# Patient Record
Sex: Female | Born: 1935 | Race: White | Hispanic: No | Smoking: Former smoker
Health system: Southern US, Community
[De-identification: ages and names within clinical notes are randomized; demographics above are authoritative.]

## PROBLEM LIST (undated history)

## (undated) DIAGNOSIS — Z5189 Encounter for other specified aftercare: Secondary | ICD-10-CM

## (undated) DIAGNOSIS — C8590 Non-Hodgkin lymphoma, unspecified, unspecified site: Secondary | ICD-10-CM

## (undated) DIAGNOSIS — I10 Essential (primary) hypertension: Secondary | ICD-10-CM

## (undated) DIAGNOSIS — G4730 Sleep apnea, unspecified: Secondary | ICD-10-CM

## (undated) DIAGNOSIS — I509 Heart failure, unspecified: Secondary | ICD-10-CM

## (undated) DIAGNOSIS — C801 Malignant (primary) neoplasm, unspecified: Secondary | ICD-10-CM

## (undated) HISTORY — PX: BACK SURGERY: SHX140

## (undated) HISTORY — PX: APPENDECTOMY: SHX54

## (undated) HISTORY — PX: TONSILLECTOMY: SUR1361

## (undated) HISTORY — PX: CHOLECYSTECTOMY: SHX55

## (undated) HISTORY — PX: ABDOMINAL HYSTERECTOMY: SHX81

## (undated) HISTORY — PX: SPLENECTOMY: SUR1306

## (undated) HISTORY — PX: BREAST CYST ASPIRATION: SHX578

## (undated) HISTORY — PX: JOINT REPLACEMENT: SHX530

## (undated) HISTORY — PX: EYE SURGERY: SHX253

---

## 1998-03-03 ENCOUNTER — Other Ambulatory Visit: Admission: RE | Admit: 1998-03-03 | Discharge: 1998-03-03 | Payer: Self-pay | Admitting: Family Medicine

## 1999-02-14 ENCOUNTER — Encounter: Admission: RE | Admit: 1999-02-14 | Discharge: 1999-02-14 | Payer: Self-pay | Admitting: Family Medicine

## 1999-02-14 ENCOUNTER — Encounter: Payer: Self-pay | Admitting: Family Medicine

## 1999-05-02 ENCOUNTER — Encounter: Admission: RE | Admit: 1999-05-02 | Discharge: 1999-05-02 | Payer: Self-pay | Admitting: Family Medicine

## 1999-05-02 ENCOUNTER — Encounter: Payer: Self-pay | Admitting: Family Medicine

## 1999-05-10 ENCOUNTER — Encounter: Admission: RE | Admit: 1999-05-10 | Discharge: 1999-05-10 | Payer: Self-pay | Admitting: Family Medicine

## 1999-05-10 ENCOUNTER — Encounter: Payer: Self-pay | Admitting: Family Medicine

## 2000-05-14 ENCOUNTER — Encounter: Payer: Self-pay | Admitting: Family Medicine

## 2000-05-14 ENCOUNTER — Encounter: Admission: RE | Admit: 2000-05-14 | Discharge: 2000-05-14 | Payer: Self-pay | Admitting: Family Medicine

## 2001-05-20 ENCOUNTER — Encounter: Payer: Self-pay | Admitting: Family Medicine

## 2001-05-20 ENCOUNTER — Encounter: Admission: RE | Admit: 2001-05-20 | Discharge: 2001-05-20 | Payer: Self-pay | Admitting: Family Medicine

## 2001-05-27 ENCOUNTER — Encounter: Payer: Self-pay | Admitting: Family Medicine

## 2001-05-27 ENCOUNTER — Encounter: Admission: RE | Admit: 2001-05-27 | Discharge: 2001-05-27 | Payer: Self-pay | Admitting: Family Medicine

## 2002-01-10 ENCOUNTER — Encounter: Admission: RE | Admit: 2002-01-10 | Discharge: 2002-01-10 | Payer: Self-pay | Admitting: Family Medicine

## 2002-01-10 ENCOUNTER — Encounter: Payer: Self-pay | Admitting: Family Medicine

## 2002-06-05 ENCOUNTER — Encounter: Admission: RE | Admit: 2002-06-05 | Discharge: 2002-06-05 | Payer: Self-pay | Admitting: Family Medicine

## 2002-06-05 ENCOUNTER — Encounter: Payer: Self-pay | Admitting: Family Medicine

## 2003-06-08 ENCOUNTER — Encounter: Admission: RE | Admit: 2003-06-08 | Discharge: 2003-06-08 | Payer: Self-pay | Admitting: Family Medicine

## 2004-08-19 ENCOUNTER — Encounter: Admission: RE | Admit: 2004-08-19 | Discharge: 2004-08-19 | Payer: Self-pay | Admitting: Family Medicine

## 2004-08-29 ENCOUNTER — Encounter: Admission: RE | Admit: 2004-08-29 | Discharge: 2004-08-29 | Payer: Self-pay | Admitting: Family Medicine

## 2004-09-02 ENCOUNTER — Encounter (INDEPENDENT_AMBULATORY_CARE_PROVIDER_SITE_OTHER): Payer: Self-pay | Admitting: *Deleted

## 2004-09-02 ENCOUNTER — Encounter: Admission: RE | Admit: 2004-09-02 | Discharge: 2004-09-02 | Payer: Self-pay | Admitting: Family Medicine

## 2004-10-05 ENCOUNTER — Inpatient Hospital Stay (HOSPITAL_COMMUNITY): Admission: EM | Admit: 2004-10-05 | Discharge: 2004-10-08 | Payer: Self-pay | Admitting: Emergency Medicine

## 2004-10-06 ENCOUNTER — Encounter (INDEPENDENT_AMBULATORY_CARE_PROVIDER_SITE_OTHER): Payer: Self-pay | Admitting: Cardiovascular Disease

## 2005-09-01 ENCOUNTER — Encounter: Admission: RE | Admit: 2005-09-01 | Discharge: 2005-09-01 | Payer: Self-pay | Admitting: Family Medicine

## 2006-04-23 ENCOUNTER — Encounter: Admission: RE | Admit: 2006-04-23 | Discharge: 2006-04-23 | Payer: Self-pay | Admitting: Family Medicine

## 2006-06-17 ENCOUNTER — Inpatient Hospital Stay (HOSPITAL_COMMUNITY): Admission: EM | Admit: 2006-06-17 | Discharge: 2006-06-19 | Payer: Self-pay | Admitting: Emergency Medicine

## 2006-08-01 ENCOUNTER — Ambulatory Visit (HOSPITAL_COMMUNITY): Admission: RE | Admit: 2006-08-01 | Discharge: 2006-08-01 | Payer: Self-pay | Admitting: Family Medicine

## 2006-09-17 ENCOUNTER — Encounter: Admission: RE | Admit: 2006-09-17 | Discharge: 2006-09-17 | Payer: Self-pay | Admitting: Family Medicine

## 2007-11-18 ENCOUNTER — Encounter: Admission: RE | Admit: 2007-11-18 | Discharge: 2007-11-18 | Payer: Self-pay | Admitting: Family Medicine

## 2007-11-21 ENCOUNTER — Encounter: Admission: RE | Admit: 2007-11-21 | Discharge: 2007-11-21 | Payer: Self-pay | Admitting: Family Medicine

## 2008-06-05 ENCOUNTER — Encounter: Admission: RE | Admit: 2008-06-05 | Discharge: 2008-06-05 | Payer: Self-pay | Admitting: Internal Medicine

## 2008-11-23 ENCOUNTER — Encounter: Admission: RE | Admit: 2008-11-23 | Discharge: 2008-11-23 | Payer: Self-pay | Admitting: Internal Medicine

## 2008-11-30 ENCOUNTER — Encounter: Admission: RE | Admit: 2008-11-30 | Discharge: 2008-11-30 | Payer: Self-pay | Admitting: Internal Medicine

## 2009-11-25 ENCOUNTER — Encounter: Admission: RE | Admit: 2009-11-25 | Discharge: 2009-11-25 | Payer: Self-pay | Admitting: Internal Medicine

## 2010-03-20 ENCOUNTER — Encounter: Payer: Self-pay | Admitting: Family Medicine

## 2010-03-20 ENCOUNTER — Encounter: Payer: Self-pay | Admitting: Internal Medicine

## 2010-03-21 ENCOUNTER — Encounter: Payer: Self-pay | Admitting: Family Medicine

## 2010-07-15 NOTE — Discharge Summary (Signed)
NAME:  Virginia Thompson, Virginia Thompson NO.:  [id number]   MEDICAL RECORD NO.:  [id number]          PATIENT TYPE:  INP   LOCATION:  4703                         FACILITY:  [facility]   PHYSICIAN:  Renato Battles, M.D.     DATE OF BIRTH:  Jul 24, 1928   DATE OF ADMISSION:  10/05/2004  DATE OF DISCHARGE:  10/08/2004                                 DISCHARGE SUMMARY   DATE OF ADMISSION:  October 05, 2004.   DATE OF DISCHARGE:  October 07, 2004   DISCHARGE DIAGNOSES:  1.  Myocardial angina secondary to anemia.  2.  Autoimmune hemolytic anemia.  3.  Lymphoma.  4.  Hypertension.  5.  Diabetes.  6.  Gastroesophageal reflux disease.  7.  Osteoporosis.   DISCHARGE MEDICATIONS:  1.  HCTZ 25 mg p.o. daily.  2.  Nexium 40 mg p.o. daily.  3.  Lisinopril 20  mg p.o. daily.  4.  Toprol-XL 15 mg p.o. daily.  5.  Cytoxan 50 mg p.o. b.i.d.  6.  Prednisone 20 mg p.o. b.i.d.  7.  Aspirin 81 mg p.o. daily.   PROCEDURE:  Transfusion of two units of special order blood secondary to  patient having antibodies.   HISTORY AND PHYSICAL/HOSPITAL COURSE:  The patient is a pleasant 77 year old  white female with known history of lymphoma for which she goes to [facility] for treatment.  She presented to the emergency  department complaining of chest pain and was admitted for further workup and  management.  Cardiac enzymes were negative and EKG showed no changes.  Upon  further questioning, the patient reported that she was in [facility]  very recently within the last two weeks for exactly similar complaints.  At  that time, she was really anemic with hemoglobin of 5.6 and she was told  that her chest pain was coming from being severely anemic and she was  transfused to a reasonable hemoglobin and then was discharged with plan to  do elective catheterization at [facility].   Upon physical exam, the patient was hemodynamically stable with no  significant findings.  Initial  labs showed very low hemoglobin of 7.4,  otherwise were not significant.  I contacted Dr. Cliffton Asters at [facility]  and discussed the case with him.  We agreed upon transfusing patient to a  reasonable hemoglobin level and discharge her to follow up with her  cardiologist at [facility] which is scheduled already and allow her to have  catheterization electively over there.  The patient was transfused two units  of packed RBCs radiated and especially prepared by the blood bank.  She was  pretreated with steroids, Benadryl and Tylenol.  She tolerated the  transfusion with no problems and her hemoglobin came back to 10.5 afterward.  She had no chest pain, no shortness of breath, no other complaints.  She was  felt to be stable enough to be discharged.  Of note, the patient has  required a significant amount of insulin during her stay and is thought to  be secondary to high doses of steroids.  The  patient is not going home on  any insulin or diabetic treatment.  This is deferred to her primary care  physician.   DISCHARGE INSTRUCTIONS:  Low fat diet.  Activity as tolerated.   DISPOSITION:  Home.   FOLLOWUP:  The patient has appointment to see her cardiologist, Dr.  Roxan Hockey, at [facility].  She also has  appointment to follow up with Dr. Cliffton Asters, an oncologist.  She was instructed  to call her primary care physician, Dr. Caryl Never, and obtain a followup  appointment with him pretty soon.      Renato Battles, M.D.  Electronically Signed     SA/MEDQ  D:  10/07/2004  T:  10/08/2004  Job:  [id number]   cc:   [facility]   Dr. Caryl Never   Dr. Carlyle Basques   Dr. Roxan Hockey   s/l Dr. Alan Mulder

## 2010-07-15 NOTE — H&P (Signed)
NAME:  Virginia Thompson, Virginia Thompson NO.:  [id number]   MEDICAL RECORD NO.:  [id number]          PATIENT TYPE:  EMS   LOCATION:  MAJO                         FACILITY:  [facility]   PHYSICIAN:  Elliot Cousin, M.D.    DATE OF BIRTH:  [birth year]   DATE OF ADMISSION:  10/05/2004  DATE OF DISCHARGE:                                HISTORY & PHYSICAL   CHIEF COMPLAINT:  Chest pain.   HISTORY OF PRESENT ILLNESS:  The patient is a 77 year old lady with a past  medical history significant for B-cell and T-cell lymphoma, hypertension,  and recent hospitalization at [facility], who presents to the emergency department with a one day chief  complaint of chest pain.  The patient was actually hospitalized at [facility] between September 27, 2004, and  October 04, 2004, for anemia and a mild heart attack.  Apparently, the  patient's anemia precipitated the myocardial infarction.  Per the patient's  history, a cardiac catheterization was not performed secondary to profound  anemia.  The patient was treated and managed by cardiologist, Dr. Roxan Hockey,  during the hospitalization.  The patient was also managed by her oncologist,  Dr. Carlyle Basques.  During the hospitalization, Cytoxan and prednisone were  both restarted.  The patient states she was treated with a variety of  medications for her heart attack including Toprol XL and Lisinopril.  She  was also given sublingual nitroglycerin, as well.  Today at approximately 2  p.m., she complained of a substernal and epigastric pain.  The pain was  rated at 7 to 8 over 10.  It was described as a feeling of indigestion and a  hurting pain.  She took three sublingual nitroglycerin over a 15 minute  period.  The sublingual nitroglycerin did not relieve her pain.  She called  EMS.  When EMS arrived, they gave her 2-3 more additional sublingual  nitroglycerin which took her pain to a  4/10.  She states the pain has now at  2/10 in intensity.  The pain radiated to her shoulders and to her jaws  bilaterally.  There was no associated diaphoresis, nausea, or shortness of  breath.  The patient is currently comfortable in no acute distress.   While in the emergency department, the patient's temperature is 97.8, blood  pressure 98/44, pulse 71, respiratory rate 17, oxygen saturation 100% on 2  liters.  Her cardiac markers are unremarkable.  The EKG reveals normal sinus  rhythm.  A chest x-ray reveals no acute disease.  However, given her overall  clinical picture and recent myocardial infarction, she will be admitted for  further evaluation and management.   PAST MEDICAL HISTORY:  1.  Recent hospitalization at [facility] September 27, 2004, through October 04, 2004, secondary to profound      anemia and a myocardial infarction.  2.  B-cell lymphoma diagnosed in 1998.  At that time, the patient was      treated with Cytoxan for  three years and she went into remission.  3.  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  Restarted on Cytoxan and      started on prednisone three days ago.  4.  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  5.  Urinary tract infection treated last week during hospitalization.  6.  Hyperglycemia, question secondary to prednisone.  7.  Hypertension.  8.  Seasonal allergies.  9.  Gastroesophageal reflux disease.  10. Osteoporosis.  11. Status post cholecystectomy in the 1960s.  12. Status post cervical disc surgery in 1993.  13. Status post lumbar back surgery in 1986.  14. Status post appendectomy.  15. Status post sinus surgery x 2.  16. Status post D&C x 3.  17. Status post bladder suspension surgery.   MEDICATIONS:  1.  Hydrochlorothiazide 25 mg daily.  2.  Nexium 40 mg daily.  3.  Lisinopril 20 mg daily.  4.  Prednisone 50 mg b.i.d.  5.  Toprol XL 50 mg daily.   6.  Allegra 180 mg daily.  7.  Doxepin  HCL 100 mg 2 tablets q.h.s.  8.  Cytoxan 50 mg b.i.d.  9.  Multi-vitamin with iron once daily.  10. Calcium with vitamin D b.i.d.  11. Docusate sodium 1 tablet b.i.d. p.r.n.  12. Folic acid question dose once daily.  13. Fosamax one tablet each week.   ALLERGIES:  The patient has allergies to Mobic and mycins.   SOCIAL HISTORY:  The patient is widowed.  The patient lives alone in [place], [place].  She does have a grandson who comes in at night to  help.  Her daughter, Kriste Basque, lives next door.  Up until 3-4 weeks ago, she  smoked an average of 2 cigarettes per day.  She states she has stopped  smoking for good now.  She denies alcohol and illicit drug use.  She  generally uses a walker to ambulate.  She can read and write.   FAMILY HISTORY:  Her father died of a heart attack at 75 years of age and  her mother died of a heart attack at 109 years of age.  She recently lost a  sister at 56 years of age secondary to COPD.   REVIEW OF SYMPTOMS:  The patient's review of systems has been remarkable for  generalized weakness, chest pain, occasional arthritis pain in her hands,  back, and legs.  Otherwise, review of systems is negative.   PHYSICAL EXAMINATION:  VITAL SIGNS:  Temperature 97.8, blood pressure 98/44, pulse 71, respiratory  rate 17, oxygen saturation 100% on 2 liters.  GENERAL:  The patient is a pleasant, alert 77 year old mildly overweight  Caucasian lady who is currently lying in bed in no acute distress.  HEENT:  Head is normocephalic, atraumatic.  Pupils equal, round, reactive to  light.  Extraocular movements intact.  Conjunctivae clear.  Sclerae white.  Tympanic membranes are clear bilaterally.  Nasal mucosa is mildly dry, no  sinus tenderness.  Oropharynx reveals a full set of dentures.  Mucous  membranes mildly dry.  No posterior exudates or erythema. NECK:  Supple, no adenopathy appreciated.  No thyromegaly, no JVD,  no  bruits.  LUNGS:  Clear to auscultation bilaterally.  HEART:  Distant S1 and S2 with a soft systolic murmur.  ABDOMEN:  Well healed right upper quadrant scar, well healed left upper  quadrant scar, well healed vertical hypogastric scar.  Abdomen is obese,  positive bowel sounds, soft, nontender, nondistended, no masses palpated.  RECTAL AND GU:  Deferred.  EXTREMITIES:  The patient has multiple varicosities of both her lower legs  around the ankles bilaterally.  Pedal pulses are 2+ bilaterally.  No  pretibial edema, no pedal edema.  The patient has good range of motion of  her upper extremities and her lower extremities.  No acute joint  abnormalities.  NEUROLOGICAL:  The patient is alert and oriented x 3.  Cranial nerves 2-12  are intact.  Strength 5/5 throughout.  Sensation is intact.  Plantar  reflexes downgoing.   ADMISSION LABORATORY DATA:  EKG showed normal sinus rhythm with heart rate  of 65 beats per minute.  Chest x-ray reveals no acute disease.  Sodium 134,  potassium 3.7, chloride 99, BUN 32, glucose 158, bicarbonate 31.2,  creatinine 1, myoglobin 31.5.  CK MB less than 1, troponin I less than 0.05.  PT 14.3, INR 1.1, PTT 27.  WBC 4.9, hemoglobin 7.8, MCV 111.1, hematocrit  23.8, platelets 386.   ASSESSMENT:  1.  Chest pain.  The chest pain appears to be somewhat atypical, however,      the patient's pain was relieved somewhat after several sublingual      nitroglycerin.  From her history, she has had a recent mild heart      attack last week during her hospitalization at [facility].  Per her account, a cardiac      catheterization was not performed given the profound anemia at the time.      The patient is currently comfortable and hemodynamically stable.  She      will need to be assessed further with cardiac enzymes and cardiology      input.  Currently, her EKG is unremarkable and her cardiac markers are      negative.   Cardiac enzymes will be ordered, however.  2.  Macrocytic anemia.  The macrocytic anemia is probably secondary to      chronic disease (lymphoma).  The patient, per her account, was      transfused 4 units packed red blood cells during the week long      hospitalization at [facility].      At the time of hospital discharge, her daughter Kriste Basque believes that her      hemoglobin was in the upper 8 gram range, between 8.5 and 8.9, however,      she is not sure.  The patient has not had any melena or hematochezia.      No hemoptysis or hematemesis.  3.  Mild azotemia.  The patient's BUN is 32 and creatinine 1.  The baseline      is unknown at this time.  However, the patient does appear a little dry      and she has been treated recently with hydrochlorothiazide.  4.  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  Per the patient's account,     Cytoxan and prednisone were started three days ago.  5.  Hyperglycemia.  The patient states she has no history of type 2 diabetes      mellitus.  The hyperglycemia is probably secondary to prednisone      therapy.   PLAN:  1.  The patient will be admitted for further evaluation and management.  2.  Cardiac enzymes will be ordered as well as iron studies.  Guaiac stools.  3.  Will type and cross 2 units of packed red blood cells and transfuse 2      units tonight. Monitor CBC closely.  4.  Will decrease the doses of Lisinopril and Toprol and monitor blood      pressures as her systolic blood pressure is less than 100 currently.      The patient will also be started on a nitroglycerin drip and titrated to      pain.  Will stop or titrate down if her systolic blood pressure falls      below 90.  5.  Will check another EKG in the a.m.  6.  Sliding scale insulin regimen and Lantus for elevated blood sugars.      Will consult the registered dietician and nutrition team for education      and home glucose monitoring  while on prednisone.  7.  Continue prednisone and Cytoxan as previously ordered.  8.  Will check a TSH, BNP, fasting lipid panel, homocystine, hemoglobin A1C      for completion.  9.  Will hold on the treatment doses of Lovenox for myocardial infarction,      given the normal EKG and virtually asymptomatic at this time.  Also,      concerned about the profound anemia.  Will start, however, prophylactic      doses of Lovenox for DVT prophylaxis.  10. Obtain records from [facility].  11. Cardiology has been consulted.       DF/MEDQ  D:  10/05/2004  T:  10/05/2004  Job:  [id number]   cc:   Evelena Peat, M.D.  [address]  [place]  [place] [address]  Fax: [phone]

## 2010-07-15 NOTE — H&P (Signed)
NAME:  Virginia Thompson, Virginia Thompson NO.:  [id number]   MEDICAL RECORD NO.:  [id number]          PATIENT TYPE:  EMS   LOCATION:  MAJO                         FACILITY:  [facility]   PHYSICIAN:  Altha Harm, MDDATE OF BIRTH:  [birth year]   DATE OF ADMISSION:  06/17/2006  DATE OF DISCHARGE:                              HISTORY & PHYSICAL   CHIEF COMPLAINT:  Difficulty breathing.   HISTORY OF PRESENT ILLNESS:  This is a 77 year old lady with a history  of lymphoma and prior pneumonias, including opportunistic, in the past  who presents to the emergency room with complaints of shortness of  breath and feeling tired for approximately four days.  The patient  states that she had some difficulty breathing, however, she is unable to  elaborate on that.  She does state that she had no fevers at home that  she can identify.  However, she states that her breathing felt like she  felt at the time before when she had had a pneumonia.  She has had no  nausea, vomiting, or diarrhea.  No fever or chills.  No loss of  consciousness, headaches, dizziness.  She has had no chest pain, no  abdominal pain.   PAST MEDICAL HISTORY:  Significant for:  1. Pneumocystis pneumonia.  2. Hypotension.  3. Questionable coronary artery disease, not confirmed but treated as      such.  4. Lymphoma.  5. Restless let syndrome.  6. Osteoporosis.  7. Possible congestive heart failure.   FAMILY HISTORY:  Significant for hypotension.   SOCIAL HISTORY:  The patient resides with her daughter.  She has no  tobacco, alcohol, or drug use.   PRIMARY CARE PHYSICIAN:  Dr. Caryl Never.   ALLERGIES:  TO ERYTHROMYCIN, MOBIC, ALEVE, AND ALSO TO TAPE.   CURRENT MEDICATIONS:  Include the following:  1. Nexium 40 mg p.o. daily.  2. Colace caplets 100 mg p.o. q.12 hours p.r.n.  3. Lasix 40 mg p.o. daily.  4. Mirapex 0.126 mg p.o. nightly.  5. Aspirin 81 mg p.o. daily.  6. Enalapril 5 mg tabs p.o. daily.  7.  Toprol-XL 100 mg p.o. b.i.d.  8. Bactrim DS 800/160 mg tabs Monday, Wednesday, and Friday.  9. Zocor 40 mg p.o. daily.  10.Fosamax 70 mg p.o. weekly.  11.Oxycodone 5 mg p.o. q.6 hours p.r.n.  12.Prednisone 5 mg p.o. daily.  13.Isosorbide mononitrate 30 mg p.o. daily.  14.Zoloft 50 mg p.o. daily.   REVIEW OF SYSTEMS:  Fourteen systems were reviewed.  All systems are  negative, except as noted in the HPI.   EXAMINATION:  GENERAL:  The patient is sitting comfortably in the bed in  no acute distress.  VITAL SIGNS:  Blood pressure 188/66, heart rate 78, respiratory rate 22,  temperature 99.9, and O2 saturations 96% on room air.  HEENT:  She is normocephalic, atraumatic.  Pupils are equally round and  reactive to light and accommodation.  Extraocular movements are intact.  Tympanic membranes are translucent bilaterally with good landmarks.  Oropharynx is moist.  No exudate, erythema, or lesions are noted.  NECK:  Trachea  is midline.  No masses, no thyromegaly, no JVD, no  carotid bruit.  RESPIRATORY EXAMINATION:  The patient has normal respiratory effort.  She is mildly tachypneic.  She has got crackles throughout the lung  fields.  There is no wheezing present.  There are no E to A changes on  auscultation and there is no increased vocal fremitus or tactile  fremitus noted.  CARDIOVASCULAR:  She has got normal S1 and S2 with no murmurs, rubs, or  gallops noted.  PMI is nondisplaced.  No heaves or thrills on palpation.  NEUROLOGICAL:  The patient is alert and oriented x3.  DTRs are 2+  bilaterally in upper and lower extremities.  Strength is 4+/5 in  bilateral upper and lower extremities.  Sensation is intact to light  touch, proprioception.  PSYCHIATRIC:  The patient is alert and oriented x3.  Has good insight  and cognition, and good recent and remote recall.  MUSCULOSKELETAL:  The patient has no warmth, swelling, or erythema  around the joints and range of motion is normal around the  joints.   LABORATORY STUDIES DONE IN THE EMERGENCY ROOM:  Show a white blood cell  count of 11, hemoglobin of 14.1, platelet count of 301, sodium of 133,  potassium of 4.0, chloride 101, bicarbonate 25, BUN 12, creatinine 0.8.   ASSESSMENT AND PLAN:  This is a patient who presents with pneumonia,  most likely starting in the right upper lobe.  We will get a sputum  culture on the patient, particularly in light of the fact that she has  had opportunistic infections in the past.  However, considering this is  community acquired, it is most likely Strep infection.  We will treat  her empirically with Avelox and continue her on her Bactrim.  Institute  all other medications.      Altha Harm, MD  Electronically Signed     MAM/MEDQ  D:  06/17/2006  T:  06/17/2006  Job:  [id number]

## 2010-07-15 NOTE — Discharge Summary (Signed)
NAME:  Virginia Thompson, DOSHIER NO.:  [id number]   MEDICAL RECORD NO.:  [id number]          PATIENT TYPE:  INP   LOCATION:  6741                         FACILITY:  [facility]   PHYSICIAN:  Altha Harm, MDDATE OF BIRTH:  10/12/1928   DATE OF ADMISSION:  06/17/2006  DATE OF DISCHARGE:  06/19/2006                               DISCHARGE SUMMARY   DISCHARGE DISPOSTION:  Home.   FINAL DISCHARGE DIAGNOSES:  1. Community-acquired pneumonia, right upper lobe.  2. Hypoxemia.  3. Reactive airway disease.  4. History of Pneumocystis pneumonia.  5. History of hypertension.  6. History of lymphoma.  7. History of restless legs syndrome.  8. Osteoporosis.  9. Questionable coronary artery disease.  10.Questionable congestive heart failure.   DISCHARGE MEDICATIONS:  1. Medrol Dosepak, use as directed.  2. Nexium 40 mg p.o. daily.  3. Colace 100 mg p.o. b.i.d.  4. Lasix 40 mg p.o. daily.  5. Mirapex 0.125 mg p.o. nightly.  6. Aspirin 81 mg p.o. daily.  7. Enalapril 5 mg p.o. daily.  8. Toprol XL 100 mg p.o. b.i.d.  9. Bactrim DS 1 tab p.o. on Monday, Wednesday, and Friday.  10.Zocor 40 mg p.o. daily.  11.Fosamax 70 mg p.o. weekly.  12.Oxycodone 5 mg p.o. daily.  13.Imdur 30 mg p.o. daily.  14.Zoloft 50 mg p.o. daily.  15.Albuterol MDI 2 puffs by mouth up to 4 times a day as needed.  16.Folate 1 mg p.o. b.i.d.  17.Prednisone 5 mg p.o. daily to be resumed after Medrol Dosepak      completed.   CONSULTANTS:  None.   PROCEDURES:  None.   DIAGNOSTIC STUDIES:  Chest x-ray 2 view which shows diffuse interstitial  prominence somewhat more focal interstitial and possibly early airspace  disease in the right upper lobe concerning for pneumonia.   PERTINENT LABORATORY STUDIES:  At the time of discharge, WBC count 11.0,  hemoglobin 12.4, hematocrit 36.2, platelet count 268,000.  Sodium 140,  potassium 4.4, chloride 104, bicarb 28, BUN 17, creatinine 0.87.   CHIEF COMPLAINT:   Difficulty breathing.   HISTORY OF PRESENT ILLNESS:  Please see H&P dictated by Dr. Ashley Royalty for  details of the HPI.   HOSPITAL COURSE:  1. Pneumonia:  The patient was admitted and given supplemental oxygen,      started on Avelox for treatment of pneumonia.  The patient      initially had a mildly elevated white blood cell count to be 11,      which decreased to normal white blood count of 9.  The patient      initially had hypoxemia, and the hypoxemia at rest was resolved.      Presently, the patient is being evaluated for ambulatory hypoxemia      and will be discharged on oxygen if her ambulatory saturations are      less than 87%.  All of her medical problems remain stable while      hospitalized, and the patient is being resumed on all her      medications as above.  Please note that the patient  was placed on a      stress dose of steroids, going from 5 to 20, and the patient was      being tapered via Medrol Dosepak back down to her 5 mg of      prednisone, and she is to follow up with her primary care      physician, Dr. Caryl Never in 3-5 days.   DIETARY RESTRICTIONS:  The patient should be on a low-sodium, low-  cholesterol diet.   PHYSICAL RESTRICTIONS:  As tolerated.      Altha Harm, MD  Electronically Signed     MAM/MEDQ  D:  06/19/2006  T:  06/19/2006  Job:  [id number]   cc:   Evelena Peat, M.D.

## 2010-11-03 ENCOUNTER — Other Ambulatory Visit: Payer: Self-pay | Admitting: Internal Medicine

## 2010-11-03 DIAGNOSIS — Z1231 Encounter for screening mammogram for malignant neoplasm of breast: Secondary | ICD-10-CM

## 2010-12-26 ENCOUNTER — Inpatient Hospital Stay: Admission: RE | Admit: 2010-12-26 | Payer: Self-pay | Source: Ambulatory Visit

## 2011-01-23 ENCOUNTER — Ambulatory Visit
Admission: RE | Admit: 2011-01-23 | Discharge: 2011-01-23 | Disposition: A | Discharge status: Home / Self Care | Payer: Medicare Other | Source: Ambulatory Visit | Attending: Internal Medicine | Admitting: Internal Medicine

## 2011-01-23 DIAGNOSIS — Z1231 Encounter for screening mammogram for malignant neoplasm of breast: Secondary | ICD-10-CM

## 2011-01-30 ENCOUNTER — Other Ambulatory Visit: Payer: Self-pay | Admitting: Internal Medicine

## 2011-01-30 DIAGNOSIS — R928 Other abnormal and inconclusive findings on diagnostic imaging of breast: Secondary | ICD-10-CM

## 2011-02-13 ENCOUNTER — Other Ambulatory Visit: Payer: Medicare Other

## 2011-02-24 ENCOUNTER — Ambulatory Visit: Admission: RE | Admit: 2011-02-24 | Payer: Medicare Other | Source: Ambulatory Visit

## 2011-02-24 ENCOUNTER — Ambulatory Visit
Admission: RE | Admit: 2011-02-24 | Discharge: 2011-02-24 | Disposition: A | Discharge status: Home / Self Care | Payer: Medicare Other | Source: Ambulatory Visit | Attending: Internal Medicine | Admitting: Internal Medicine

## 2011-02-24 DIAGNOSIS — R928 Other abnormal and inconclusive findings on diagnostic imaging of breast: Secondary | ICD-10-CM

## 2011-05-18 ENCOUNTER — Other Ambulatory Visit: Payer: Self-pay | Admitting: Geriatric Medicine

## 2011-05-26 ENCOUNTER — Ambulatory Visit
Admission: RE | Admit: 2011-05-26 | Discharge: 2011-05-26 | Disposition: A | Discharge status: Home / Self Care | Payer: Medicare Other | Source: Ambulatory Visit | Attending: Geriatric Medicine | Admitting: Geriatric Medicine

## 2012-01-02 ENCOUNTER — Other Ambulatory Visit: Payer: Self-pay | Admitting: Internal Medicine

## 2012-01-02 DIAGNOSIS — Z1231 Encounter for screening mammogram for malignant neoplasm of breast: Secondary | ICD-10-CM

## 2012-02-12 ENCOUNTER — Ambulatory Visit
Admission: RE | Admit: 2012-02-12 | Discharge: 2012-02-12 | Disposition: A | Discharge status: Home / Self Care | Payer: Medicare Other | Source: Ambulatory Visit | Attending: Internal Medicine | Admitting: Internal Medicine

## 2012-02-12 DIAGNOSIS — Z1231 Encounter for screening mammogram for malignant neoplasm of breast: Secondary | ICD-10-CM

## 2013-04-03 ENCOUNTER — Other Ambulatory Visit: Payer: Self-pay

## 2013-04-03 DIAGNOSIS — Z1231 Encounter for screening mammogram for malignant neoplasm of breast: Secondary | ICD-10-CM

## 2013-04-21 ENCOUNTER — Ambulatory Visit: Payer: Medicare Other

## 2013-05-05 ENCOUNTER — Ambulatory Visit: Payer: Medicare Other

## 2013-05-14 ENCOUNTER — Ambulatory Visit: Payer: Medicare Other

## 2015-09-20 ENCOUNTER — Other Ambulatory Visit: Payer: Self-pay | Admitting: Geriatric Medicine

## 2015-09-20 DIAGNOSIS — I10 Essential (primary) hypertension: Secondary | ICD-10-CM

## 2015-09-20 DIAGNOSIS — M810 Age-related osteoporosis without current pathological fracture: Secondary | ICD-10-CM

## 2015-09-29 ENCOUNTER — Ambulatory Visit
Admission: RE | Admit: 2015-09-29 | Discharge: 2015-09-29 | Disposition: A | Discharge status: Home / Self Care | Payer: Medicare Other | Source: Ambulatory Visit | Attending: Geriatric Medicine | Admitting: Geriatric Medicine

## 2015-09-29 DIAGNOSIS — M810 Age-related osteoporosis without current pathological fracture: Secondary | ICD-10-CM

## 2016-04-08 ENCOUNTER — Emergency Department (HOSPITAL_COMMUNITY)
Admission: EM | Admit: 2016-04-08 | Discharge: 2016-04-08 | Disposition: A | Discharge status: Home / Self Care | Payer: Medicare Other | Attending: Emergency Medicine | Admitting: Emergency Medicine

## 2016-04-08 ENCOUNTER — Encounter (HOSPITAL_COMMUNITY): Payer: Self-pay | Admitting: Emergency Medicine

## 2016-04-08 ENCOUNTER — Emergency Department (HOSPITAL_COMMUNITY): Payer: Medicare Other

## 2016-04-08 DIAGNOSIS — W19XXXA Unspecified fall, initial encounter: Secondary | ICD-10-CM

## 2016-04-08 DIAGNOSIS — W010XXA Fall on same level from slipping, tripping and stumbling without subsequent striking against object, initial encounter: Secondary | ICD-10-CM | POA: Insufficient documentation

## 2016-04-08 DIAGNOSIS — S0101XA Laceration without foreign body of scalp, initial encounter: Secondary | ICD-10-CM

## 2016-04-08 DIAGNOSIS — Y939 Activity, unspecified: Secondary | ICD-10-CM | POA: Insufficient documentation

## 2016-04-08 DIAGNOSIS — I110 Hypertensive heart disease with heart failure: Secondary | ICD-10-CM | POA: Diagnosis not present

## 2016-04-08 DIAGNOSIS — I509 Heart failure, unspecified: Secondary | ICD-10-CM | POA: Diagnosis not present

## 2016-04-08 DIAGNOSIS — Y999 Unspecified external cause status: Secondary | ICD-10-CM | POA: Insufficient documentation

## 2016-04-08 DIAGNOSIS — Z859 Personal history of malignant neoplasm, unspecified: Secondary | ICD-10-CM | POA: Insufficient documentation

## 2016-04-08 DIAGNOSIS — Y92002 Bathroom of unspecified non-institutional (private) residence single-family (private) house as the place of occurrence of the external cause: Secondary | ICD-10-CM | POA: Diagnosis not present

## 2016-04-08 DIAGNOSIS — S0990XA Unspecified injury of head, initial encounter: Secondary | ICD-10-CM | POA: Diagnosis present

## 2016-04-08 DIAGNOSIS — L03116 Cellulitis of left lower limb: Secondary | ICD-10-CM | POA: Diagnosis not present

## 2016-04-08 DIAGNOSIS — Z23 Encounter for immunization: Secondary | ICD-10-CM | POA: Diagnosis not present

## 2016-04-08 DIAGNOSIS — Z87891 Personal history of nicotine dependence: Secondary | ICD-10-CM | POA: Insufficient documentation

## 2016-04-08 HISTORY — DX: Malignant (primary) neoplasm, unspecified: C80.1

## 2016-04-08 HISTORY — DX: Essential (primary) hypertension: I10

## 2016-04-08 HISTORY — DX: Sleep apnea, unspecified: G47.30

## 2016-04-08 HISTORY — DX: Heart failure, unspecified: I50.9

## 2016-04-08 HISTORY — DX: Non-Hodgkin lymphoma, unspecified, unspecified site: C85.90

## 2016-04-08 HISTORY — DX: Encounter for other specified aftercare: Z51.89

## 2016-04-08 LAB — CBC WITH DIFFERENTIAL/PLATELET
Basophils Absolute: 0.1 10*3/uL (ref 0.0–0.1)
Basophils Relative: 1 %
EOS ABS: 0.3 10*3/uL (ref 0.0–0.7)
Eosinophils Relative: 3 %
HCT: 40.3 % (ref 36.0–46.0)
HEMOGLOBIN: 12.9 g/dL (ref 12.0–15.0)
LYMPHS ABS: 2.1 10*3/uL (ref 0.7–4.0)
LYMPHS PCT: 23 %
MCH: 31.3 pg (ref 26.0–34.0)
MCHC: 32 g/dL (ref 30.0–36.0)
MCV: 97.8 fL (ref 78.0–100.0)
MONOS PCT: 10 %
Monocytes Absolute: 0.9 10*3/uL (ref 0.1–1.0)
Neutro Abs: 5.8 10*3/uL (ref 1.7–7.7)
Neutrophils Relative %: 63 %
Platelets: 306 10*3/uL (ref 150–400)
RBC: 4.12 MIL/uL (ref 3.87–5.11)
RDW: 15.6 % — ABNORMAL HIGH (ref 11.5–15.5)
WBC: 9.3 10*3/uL (ref 4.0–10.5)

## 2016-04-08 LAB — BASIC METABOLIC PANEL
Anion gap: 9 (ref 5–15)
BUN: 17 mg/dL (ref 6–20)
CHLORIDE: 99 mmol/L — AB (ref 101–111)
CO2: 29 mmol/L (ref 22–32)
Calcium: 8.6 mg/dL — ABNORMAL LOW (ref 8.9–10.3)
Creatinine, Ser: 1 mg/dL (ref 0.44–1.00)
GFR calc Af Amer: 58 mL/min — ABNORMAL LOW (ref >60)
GFR calc non Af Amer: 50 mL/min — ABNORMAL LOW (ref >60)
GLUCOSE: 106 mg/dL — AB (ref 65–99)
POTASSIUM: 4.5 mmol/L (ref 3.5–5.1)
SODIUM: 137 mmol/L (ref 135–145)

## 2016-04-08 LAB — I-STAT CG4 LACTIC ACID, ED: LACTIC ACID, VENOUS: 1.29 mmol/L (ref 0.5–1.9)

## 2016-04-08 MED ORDER — TETANUS-DIPHTH-ACELL PERTUSSIS 5-2.5-18.5 LF-MCG/0.5 IM SUSP
0.5000 mL | Freq: Once | INTRAMUSCULAR | Status: AC
  Administered 2016-04-08: 0.5 mL via INTRAMUSCULAR
  Filled 2016-04-08: qty 0.5

## 2016-04-08 MED ORDER — LIDOCAINE-EPINEPHRINE (PF) 2 %-1:200000 IJ SOLN
10.0000 mL | Freq: Once | INTRAMUSCULAR | Status: AC
  Administered 2016-04-08: 10 mL via INTRADERMAL
  Filled 2016-04-08: qty 20

## 2016-04-08 MED ORDER — CEPHALEXIN 250 MG PO CAPS
500.0000 mg | ORAL_CAPSULE | Freq: Once | ORAL | Status: AC
  Administered 2016-04-08: 500 mg via ORAL
  Filled 2016-04-08: qty 2

## 2016-04-08 MED ORDER — CEPHALEXIN 250 MG PO CAPS: 250.0000 mg | ORAL_CAPSULE | Freq: Four times a day (QID) | ORAL | 0 refills | Status: DC

## 2016-04-08 MED ORDER — SODIUM CHLORIDE 0.9 % IV BOLUS (SEPSIS)
1000.0000 mL | Freq: Once | INTRAVENOUS | Status: AC
  Administered 2016-04-08: 1000 mL via INTRAVENOUS

## 2016-04-08 NOTE — ED Provider Notes (Signed)
TIME SEEN: 5:00 AM  CHIEF COMPLAINT: fall  HPI: Pt is a 83 y.o. female with history of CHF, hypertension, lymphoma who presents emergency department after she tripped and fell on her bedspread when getting up to the bathroom just prior to arrival. Patient arrives by EMS. His laceration to the top of her scalp. States she is not on antiplatelets or anticoagulants.  Denies chest pain, shortness of breath, dizziness that led to her fall. No recent fevers, cough, vomiting or diarrhea. Does have erythema to the left lower extremity. States that this is something chronic for her but has been slightly worse. She takes Bactrim 3 times a week to prevent pneumonia. No history of DVT. No calf tenderness.  ROS: See HPI Constitutional: no fever  Eyes: no drainage  ENT: no runny nose   Cardiovascular:  no chest pain  Resp: no SOB  GI: no vomiting GU: no dysuria Integumentary: Redness of the left leg Allergy: no hives  Musculoskeletal: no leg swelling  Neurological: no slurred speech ROS otherwise negative  PAST MEDICAL HISTORY/PAST SURGICAL HISTORY:  Past Medical History:  Diagnosis Date  . Blood transfusion without reported diagnosis   . Cancer ([place])   . CHF (congestive heart failure) ([place])   . Hypertension   . Lymphoma ([place])   . Sleep apnea     MEDICATIONS:  Prior to Admission medications   Not on File    ALLERGIES:  Allergies  Allergen Reactions  . Aleve [Naproxen] Rash  . Mobic [Meloxicam] Rash  . Tape Rash    SOCIAL HISTORY:  Social History  Substance Use Topics  . Smoking status: Former Smoker    Types: Cigarettes    Quit date: 04/09/2003  . Smokeless tobacco: Never Used  . Alcohol use No    FAMILY HISTORY: No family history on file.  EXAM: BP (!) 130/52   Pulse 80   Temp 98 F (36.7 C) (Oral)   Ht 5\' 2"  (1.575 m)   Wt 212 lb (96.2 kg)   SpO2 98%   BMI 38.78 kg/m  CONSTITUTIONAL: Alert and oriented and responds appropriately to questions. Well-appearing;  well-nourished; GCS 15 HEAD: Normocephalic; 4 cm superficial laceration to the top of the scalp with associated hematoma EYES: Conjunctivae clear, PERRL, EOMI ENT: normal nose; no rhinorrhea; moist mucous membranes; pharynx without lesions noted; no dental injury; no septal hematoma NECK: Supple, no meningismus, no LAD; no midline spinal tenderness, step-off or deformity; trachea midline CARD: RRR; S1 and S2 appreciated; no murmurs, no clicks, no rubs, no gallops RESP: Normal chest excursion without splinting or tachypnea; breath sounds clear and equal bilaterally; no wheezes, no rhonchi, no rales; no hypoxia or respiratory distress CHEST:  chest wall stable, no crepitus or ecchymosis or deformity, nontender to palpation; no flail chest ABD/GI: Normal bowel sounds; non-distended; soft, non-tender, no rebound, no guarding; no ecchymosis or other lesions noted PELVIS:  stable, nontender to palpation BACK:  The back appears normal and is non-tender to palpation, there is no CVA tenderness; no midline spinal tenderness, step-off or deformity EXT: Left leg is erythematous and warm to touch from the distal shin and calf to just below the knee. There is no fluctuance or induration present. No joint effusion. 2+ DP pulses bilaterally. Normal ROM in all joints; non-tender to palpation; no edema; normal capillary refill; no cyanosis, no bony tenderness or bony deformity of patient's extremities, no joint effusion, compartments are soft, extremities are warm and well-perfused, no ecchymosis SKIN: Normal color for age and  race; warm NEURO: Moves all extremities equally, sensation to light touch intact diffusely, cranial nerves II through XII intact PSYCH: The patient's mood and manner are appropriate. Grooming and personal hygiene are appropriate.  MEDICAL DECISION MAKING: Patient here with mechanical fall. Will obtain CT of her head and cervical spine. We'll update her tetanus vaccination. She also appears to  have cellulitis of the left lower extremity. Reports is chronic for redness and warmth of her extremities but appears worse in the left side than the right today. tenderness or significant unilateral swelling to suggest DVT. Will obtain labs. She is already on Bactrim prophylactically for prevention of pneumonia.  ED PROGRESS: Patient's labs are unremarkable. No leukocytosis. Normal lactate. Afebrile. CT of her head and cervical spine show no skull fracture, intracranial hemorrhage, cervical spine fracture or dislocation. We'll discharge on Keflex for possible early cellulitis of the left lower extremity. Laceration has been repaired. We'll have her return to the ED in 7-10 days or to her PCPs office to have sutures removed. Discussed head injury return precautions. She is comfortable with this plan.   At this time, I do not feel there is any life-threatening condition present. I have reviewed and discussed all results (EKG, imaging, lab, urine as appropriate) and exam findings with patient/family. I have reviewed nursing notes and appropriate previous records.  I feel the patient is safe to be discharged home without further emergent workup and can continue workup as an outpatient as needed. Discussed usual and customary return precautions. Patient/family verbalize understanding and are comfortable with this plan.  Outpatient follow-up has been provided. All questions have been answered.    LACERATION REPAIR Performed by: Azucena Cecil PA student Authorized by: Nyra Jabs Consent: Verbal consent obtained. Risks and benefits: risks, benefits and alternatives were discussed Consent given by: patient Patient identity confirmed: provided demographic data Prepped and Draped in normal sterile fashion Wound explored  Laceration Location: Scalp  Laceration Length: 4 cm  No Foreign Bodies seen or palpated  Anesthesia: local infiltration  Local anesthetic: lidocaine 2 %  with epinephrine  Anesthetic total: 6 ml  Irrigation method: syringe Amount of cleaning: standard  Skin closure: Simple interrupted   Number of sutures: 6   Technique: Area anesthetized using lidocaine 2% with epinephrine. Wound irrigated copiously with sterile saline. Wound then cleaned with Betadine and draped in sterile fashion. Wound closed using 6 simple interrupted sutures with 5-0 Prolene.  Bacitracin and sterile dressing applied. Good wound approximation and hemostasis achieved.   Patient tolerance: Patient tolerated the procedure well with no immediate complications.      Crystal, DO 04/08/16 [phone]

## 2016-04-08 NOTE — ED Triage Notes (Signed)
Brought by ems from home.  Patient reports while trying to go to bed this morning she tripped over the bedspread and hit her head on the table beside the bed.  Laceration to top of head.  Denies any LOC.  Does not take a blood thinner.

## 2017-08-27 ENCOUNTER — Other Ambulatory Visit: Payer: Self-pay | Admitting: Geriatric Medicine

## 2017-08-27 DIAGNOSIS — Z1231 Encounter for screening mammogram for malignant neoplasm of breast: Secondary | ICD-10-CM

## 2017-09-17 ENCOUNTER — Ambulatory Visit
Admission: RE | Admit: 2017-09-17 | Discharge: 2017-09-17 | Disposition: A | Discharge status: Home / Self Care | Payer: Medicare Other | Source: Ambulatory Visit | Attending: Geriatric Medicine | Admitting: Geriatric Medicine

## 2017-09-17 DIAGNOSIS — Z1231 Encounter for screening mammogram for malignant neoplasm of breast: Secondary | ICD-10-CM

## 2018-03-03 NOTE — Progress Notes (Addendum)
[facility] Note  03/04/2018     CHIEF COMPLAINT Patient presents for Retina Evaluation   HISTORY OF PRESENT ILLNESS: Virginia Thompson is a 85 y.o. female who presents to the clinic today for:   HPI    Retina Evaluation    In both eyes.  This started 1 month ago.  Associated Symptoms Photophobia.  Negative for Flashes, Pain, Trauma, Fever, Weight Loss, Scalp Tenderness, Redness, Floaters, Distortion, Jaw Claudication, Fatigue, Shoulder/Hip pain, Glare and Blind Spot.  Context:  distance vision, mid-range vision and near vision.  Treatments tried include no treatments.  I, the attending physician,  performed the HPI with the patient and updated documentation appropriately.          Comments    Referral of DR. Weaver for retina eval/ cataract clearance. Pt states she has had blurry vision for a while, she has occasional light sensitivity. Pt reports she had tear duct (sx unsure of date). Denies vits/gtt's         Last edited by Bernarda Caffey, MD on 03/05/2018  8:18 AM. (History)    pt is scheduled for cataract sx OD on January 22, pt states Dr. Kathlen Mody does not want to schedule sx OS yet because he saw something in the back of her eye that he wants checked out first, pt states she takes BP medication and it keeps her BP under control, pt states Dr. Kathlen Mody started her on AREDS 2 vits  Referring physician: Hortencia Pilar, MD [place], [place] [id number]  HISTORICAL INFORMATION:   Selected notes from the MEDICAL RECORD NUMBER Referred by Dr. Quentin Ore for cataract clearance LEE: 12.12.19 Read Drivers) [BCVA: OD: 20/60+ OS: 20/70- Ocular Hx-cataract OU, non-exu ARMD OD, vitelliform macular dystrophy, HTN ret PMH-cancer, lymphoma, HTN, HLD, CAD    CURRENT MEDICATIONS: No current outpatient medications on file. (Ophthalmic Drugs)   No current facility-administered medications for this visit.  (Ophthalmic Drugs)   Current Outpatient  Medications (Other)  Medication Sig  . acetaminophen (TYLENOL) 500 MG tablet Take by mouth.  Marland Kitchen albuterol (PROAIR HFA) 108 (90 Base) MCG/ACT inhaler INHALE TWO PUFFS FOUR TIMES A DAY AS NEEDED  . alendronate (FOSAMAX) 70 MG tablet TAKE ONE TABLET BY MOUTH EVERY 7 DAYS  . aspirin EC 81 MG tablet Take by mouth.  . cephALEXin (KEFLEX) 250 MG capsule Take 1 capsule (250 mg total) by mouth 4 (four) times daily.  . Cholecalciferol (VITAMIN D3) 25 MCG (1000 UT) CAPS Take by mouth.  . docusate sodium (COLACE) 100 MG capsule Take by mouth.  . DULoxetine (CYMBALTA) 60 MG capsule TAKE ONE (1) CAPSULE EACH DAY  . esomeprazole (NEXIUM) 40 MG capsule TAKE ONE CAPSULE BY MOUTH DAILY BEFORE BREAKFAST  . folic acid (FOLVITE) 1 MG tablet TAKE ONE (1) TABLET THREE (3) TIMES EACH DAY  . furosemide (LASIX) 40 MG tablet Take 1.5 tablets in the AM  . lovastatin (MEVACOR) 20 MG tablet TAKE ONE TABLET BY MOUTH NIGHTLY  . metoprolol tartrate (LOPRESSOR) 25 MG tablet TAKE ONE-HALF TABLET BY MOUTH TWICE DAILY  . nystatin (MYCOSTATIN/NYSTOP) powder Apply topically.  . pramipexole (MIRAPEX) 1 MG tablet TAKE ONE TABLET (1 MG TOTAL) BY MOUTH NIGHTLY.  . predniSONE (DELTASONE) 5 MG tablet TAKE ONE TABLET ('5MG'$  TOTAL) BY MOUTH DAILY  . sulfamethoxazole-trimethoprim (BACTRIM DS,SEPTRA DS) 800-160 MG tablet TAKE ONE TABLET BY MOUTH EVERY MONDAY, WEDNESDAY, AND FRIDAY  . tiotropium (SPIRIVA HANDIHALER) 18 MCG inhalation capsule PLACE  ONE CAPSULE IN HANDIHALER AND INHALE ONCE DAILY.   No current facility-administered medications for this visit.  (Other)      REVIEW OF SYSTEMS: ROS    Positive for: Musculoskeletal, Eyes   Negative for: Constitutional, Gastrointestinal, Neurological, Skin, Genitourinary, HENT, Endocrine, Cardiovascular, Respiratory, Psychiatric, Allergic/Imm, Heme/Lymph   Last edited by Zenovia Jordan, LPN on 08/30/4191  7:90 AM. (History)       ALLERGIES Allergies  Allergen Reactions  . Aleve  [Naproxen] Rash  . Mobic [Meloxicam] Rash  . Tape Rash    PAST MEDICAL HISTORY Past Medical History:  Diagnosis Date  . Blood transfusion without reported diagnosis   . Cancer ([place])   . CHF (congestive heart failure) ([place])   . Hypertension   . Lymphoma ([place])   . Sleep apnea    Past Surgical History:  Procedure Laterality Date  . ABDOMINAL HYSTERECTOMY    . APPENDECTOMY    . BACK SURGERY    . BREAST CYST ASPIRATION Bilateral   . CHOLECYSTECTOMY    . EYE SURGERY    . JOINT REPLACEMENT Left   . SPLENECTOMY    . TONSILLECTOMY      FAMILY HISTORY Family History  Problem Relation Age of Onset  . Breast cancer Neg Hx     SOCIAL HISTORY Social History   Tobacco Use  . Smoking status: Former Smoker    Types: Cigarettes    Last attempt to quit: 04/09/2003    Years since quitting: 14.9  . Smokeless tobacco: Never Used  Substance Use Topics  . Alcohol use: No  . Drug use: No         OPHTHALMIC EXAM:  Base Eye Exam    Visual Acuity (Snellen - Linear)      Right Left   Dist cc 20/50 +1 20/60 +2   Dist ph cc 20/40 NI   Correction:  Glasses       Tonometry (Tonopen, 9:21 AM)      Right Left   Pressure 15 13       Pupils      Dark Light Shape React APD   Right 4 3 Round Brisk None   Left 4 3 Round Brisk None       Visual Fields (Counting fingers)      Left Right    Full Full       Extraocular Movement      Right Left    Full, Ortho Full, Ortho       Neuro/Psych    Oriented x3:  Yes       Dilation    Both eyes:  1.0% Mydriacyl, 2.5% Phenylephrine @ 9:15 AM        Slit Lamp and Fundus Exam    Slit Lamp Exam      Right Left   Lids/Lashes Dermatochalasis - upper lid Dermatochalasis - upper lid   Conjunctiva/Sclera White and quiet White and quiet   Cornea mild Arcus, 1+ Punctate epithelial erosions mild Arcus, 1+ Punctate epithelial erosions   Anterior Chamber Deep, Narrow temporal angle Deep, Narrow temporal angle   Iris Round and dilated  Round and dilated   Lens 3+ Nuclear sclerosis, 3+ Cortical cataract 2-3+ Nuclear sclerosis, 3+ Cortical cataract   Vitreous Vitreous syneresis Vitreous syneresis       Fundus Exam      Right Left   Disc Compact, tempoal Peripapillary atrophy mild tilt, temporal PPA   C/D Ratio 0.1 0.3   Macula Blunted foveal reflex, Drusen,  RPE mottling and clumping, No heme or edema Blunted foveal reflex, RPE mottling and clumping, ?small vitelliform lesion centrally   Vessels Tortuous, Vascular attenuation Tortuous, Vascular attenuation   Periphery Attached, scattered reticular degeneration greatest temporally Attached, scattered reticular degeneration greatest nasally          IMAGING AND PROCEDURES  Imaging and Procedures for _0 @  OCT, Retina - OU - Both Eyes       Right Eye Quality was good. Central Foveal Thickness: 289. Progression has no prior data. Findings include normal foveal contour, no SRF, no IRF, retinal drusen  (Very mild drusen, trace ERM).   Left Eye Quality was good. Central Foveal Thickness: 329. Progression has no prior data. Findings include abnormal foveal contour, retinal drusen , subretinal hyper-reflective material, epiretinal membrane (Focal sub-foveal SRHM -- vitelliform-like legion).   Notes *Images captured and stored on drive  Diagnosis / Impression:  Non-exu ARMD OU OS vitelliform-like phenotype   Clinical management:  See below  Abbreviations: NFP - Normal foveal profile. CME - cystoid macular edema. PED - pigment epithelial detachment. IRF - intraretinal fluid. SRF - subretinal fluid. EZ - ellipsoid zone. ERM - epiretinal membrane. ORA - outer retinal atrophy. ORT - outer retinal tubulation. SRHM - subretinal hyper-reflective material                 ASSESSMENT/PLAN:    ICD-10-CM   1. Intermediate stage nonexudative age-related macular degeneration of both eyes H35.3132   2. Vitelliform lesion of macula H35.54   3. Retinal edema H35.81  OCT, Retina - OU - Both Eyes  4. Essential hypertension I10   5. Hypertensive retinopathy of both eyes H35.033   6. Combined forms of age-related cataract of both eyes H25.813     1,2. Age related macular degeneration, non-exudative, both eyes  - OS with vitelliform-like lesion -- no IRF/SRF -- adult vitelliform dystrophy vs non-exudative AMD w/ vitelliform phenotype --> no acute intervention indicated, and ok to proceed with cataract surgery OS  - The incidence, anatomy, and pathology of dry AMD, risk of progression, and the AREDS and AREDS 2 study including smoking risks discussed with patient.  - Recommend amsler grid monitoring  - f/u 3-4 months  3. No retinal edema on exam or OCT  4,5. Hypertensive retinopathy OU  - discussed importance of tight BP control  - monitor  6. Mixed form age related cataract OU  - The symptoms of cataract, surgical options, and treatments and risks were discussed with patient.  - discussed diagnosis and progression  - Under the expert care of Dr. Kathlen Mody  - OD currently scheduled for surgery on 03/20/18  - clear from a retina standpoint to proceed with cataract surgery OS when pt and surgeon are ready   Ophthalmic Meds Ordered this visit:  No orders of the defined types were placed in this encounter.      Return for f/u 3-4 months, non-exu ARMD OU.  There are no Patient Instructions on file for this visit.   Explained the diagnoses, plan, and follow up with the patient and they expressed understanding.  Patient expressed understanding of the importance of proper follow up care.   This document serves as a record of services personally performed by Gardiner Sleeper, MD, PhD. It was created on their behalf by Ernest Mallick, OA, an ophthalmic assistant. The creation of this record is the provider's dictation and/or activities during the visit.    Electronically signed by: Ernest Mallick, OA  01.05.2020 8:52 AM  Gardiner Sleeper, M.D.,  Ph.D. Diseases & Surgery of the Retina and Vitreous [facility]  I have reviewed the above documentation for accuracy and completeness, and I agree with the above. Gardiner Sleeper, M.D., Ph.D. 03/05/18 8:52 AM   Abbreviations: M myopia (nearsighted); A astigmatism; H hyperopia (farsighted); P presbyopia; Mrx spectacle prescription;  CTL contact lenses; OD right eye; OS left eye; OU both eyes  XT exotropia; ET esotropia; PEK punctate epithelial keratitis; PEE punctate epithelial erosions; DES dry eye syndrome; MGD meibomian gland dysfunction; ATs artificial tears; PFAT's preservative free artificial tears; [place] nuclear sclerotic cataract; PSC posterior subcapsular cataract; ERM epi-retinal membrane; PVD posterior vitreous detachment; RD retinal detachment; DM diabetes mellitus; DR diabetic retinopathy; NPDR non-proliferative diabetic retinopathy; PDR proliferative diabetic retinopathy; CSME clinically significant macular edema; DME diabetic macular edema; dbh dot blot hemorrhages; CWS cotton wool spot; POAG primary open angle glaucoma; C/D cup-to-disc ratio; HVF humphrey visual field; GVF goldmann visual field; OCT optical coherence tomography; IOP intraocular pressure; BRVO Branch retinal vein occlusion; CRVO central retinal vein occlusion; CRAO central retinal artery occlusion; BRAO branch retinal artery occlusion; RT retinal tear; SB scleral buckle; PPV pars plana vitrectomy; VH Vitreous hemorrhage; PRP panretinal laser photocoagulation; IVK intravitreal kenalog; VMT vitreomacular traction; MH Macular hole;  NVD neovascularization of the disc; NVE neovascularization elsewhere; AREDS age related eye disease study; ARMD age related macular degeneration; POAG primary open angle glaucoma; EBMD epithelial/anterior basement membrane dystrophy; ACIOL anterior chamber intraocular lens; IOL intraocular lens; PCIOL posterior chamber intraocular lens; Phaco/IOL phacoemulsification with  intraocular lens placement; [place] photorefractive keratectomy; LASIK laser assisted in situ keratomileusis; HTN hypertension; DM diabetes mellitus; COPD chronic obstructive pulmonary disease

## 2018-03-04 ENCOUNTER — Encounter (INDEPENDENT_AMBULATORY_CARE_PROVIDER_SITE_OTHER): Payer: Self-pay | Admitting: Ophthalmology

## 2018-03-04 ENCOUNTER — Ambulatory Visit (INDEPENDENT_AMBULATORY_CARE_PROVIDER_SITE_OTHER): Payer: Medicare Other | Admitting: Ophthalmology

## 2018-03-04 DIAGNOSIS — H3554 Dystrophies primarily involving the retinal pigment epithelium: Secondary | ICD-10-CM | POA: Diagnosis not present

## 2018-03-04 DIAGNOSIS — H353132 Nonexudative age-related macular degeneration, bilateral, intermediate dry stage: Secondary | ICD-10-CM

## 2018-03-04 DIAGNOSIS — H25813 Combined forms of age-related cataract, bilateral: Secondary | ICD-10-CM

## 2018-03-04 DIAGNOSIS — I10 Essential (primary) hypertension: Secondary | ICD-10-CM

## 2018-03-04 DIAGNOSIS — H35033 Hypertensive retinopathy, bilateral: Secondary | ICD-10-CM

## 2018-03-04 DIAGNOSIS — H3581 Retinal edema: Secondary | ICD-10-CM | POA: Diagnosis not present

## 2018-03-05 ENCOUNTER — Encounter (INDEPENDENT_AMBULATORY_CARE_PROVIDER_SITE_OTHER): Payer: Self-pay | Admitting: Ophthalmology

## 2018-07-03 ENCOUNTER — Encounter (INDEPENDENT_AMBULATORY_CARE_PROVIDER_SITE_OTHER): Payer: Medicare Other | Admitting: Ophthalmology

## 2019-03-06 ENCOUNTER — Inpatient Hospital Stay (HOSPITAL_COMMUNITY)
Admission: EM | Admit: 2019-03-06 | Discharge: 2019-03-13 | DRG: 481 | Disposition: A | Discharge status: Skilled Nursing Facility | Payer: Medicare Other | Attending: Internal Medicine | Admitting: Internal Medicine

## 2019-03-06 ENCOUNTER — Emergency Department (HOSPITAL_COMMUNITY): Payer: Medicare Other

## 2019-03-06 ENCOUNTER — Encounter (HOSPITAL_COMMUNITY): Payer: Self-pay | Admitting: Obstetrics and Gynecology

## 2019-03-06 ENCOUNTER — Other Ambulatory Visit: Payer: Self-pay

## 2019-03-06 DIAGNOSIS — G4733 Obstructive sleep apnea (adult) (pediatric): Secondary | ICD-10-CM | POA: Diagnosis not present

## 2019-03-06 DIAGNOSIS — Z20822 Contact with and (suspected) exposure to covid-19: Secondary | ICD-10-CM | POA: Diagnosis present

## 2019-03-06 DIAGNOSIS — J42 Unspecified chronic bronchitis: Secondary | ICD-10-CM | POA: Diagnosis not present

## 2019-03-06 DIAGNOSIS — T148XXA Other injury of unspecified body region, initial encounter: Secondary | ICD-10-CM | POA: Diagnosis not present

## 2019-03-06 DIAGNOSIS — J440 Chronic obstructive pulmonary disease with acute lower respiratory infection: Secondary | ICD-10-CM | POA: Diagnosis present

## 2019-03-06 DIAGNOSIS — D72829 Elevated white blood cell count, unspecified: Secondary | ICD-10-CM | POA: Diagnosis present

## 2019-03-06 DIAGNOSIS — Z8701 Personal history of pneumonia (recurrent): Secondary | ICD-10-CM

## 2019-03-06 DIAGNOSIS — S7292XS Unspecified fracture of left femur, sequela: Secondary | ICD-10-CM | POA: Diagnosis not present

## 2019-03-06 DIAGNOSIS — D62 Acute posthemorrhagic anemia: Secondary | ICD-10-CM | POA: Diagnosis not present

## 2019-03-06 DIAGNOSIS — M545 Low back pain, unspecified: Secondary | ICD-10-CM

## 2019-03-06 DIAGNOSIS — I9589 Other hypotension: Secondary | ICD-10-CM | POA: Diagnosis present

## 2019-03-06 DIAGNOSIS — Z419 Encounter for procedure for purposes other than remedying health state, unspecified: Secondary | ICD-10-CM

## 2019-03-06 DIAGNOSIS — I5033 Acute on chronic diastolic (congestive) heart failure: Secondary | ICD-10-CM | POA: Diagnosis present

## 2019-03-06 DIAGNOSIS — I110 Hypertensive heart disease with heart failure: Secondary | ICD-10-CM | POA: Diagnosis present

## 2019-03-06 DIAGNOSIS — G4730 Sleep apnea, unspecified: Secondary | ICD-10-CM | POA: Diagnosis present

## 2019-03-06 DIAGNOSIS — G2581 Restless legs syndrome: Secondary | ICD-10-CM | POA: Diagnosis not present

## 2019-03-06 DIAGNOSIS — Z9081 Acquired absence of spleen: Secondary | ICD-10-CM

## 2019-03-06 DIAGNOSIS — Z79891 Long term (current) use of opiate analgesic: Secondary | ICD-10-CM

## 2019-03-06 DIAGNOSIS — Z888 Allergy status to other drugs, medicaments and biological substances status: Secondary | ICD-10-CM | POA: Diagnosis not present

## 2019-03-06 DIAGNOSIS — K219 Gastro-esophageal reflux disease without esophagitis: Secondary | ICD-10-CM | POA: Diagnosis present

## 2019-03-06 DIAGNOSIS — Z8543 Personal history of malignant neoplasm of ovary: Secondary | ICD-10-CM | POA: Diagnosis not present

## 2019-03-06 DIAGNOSIS — D591 Autoimmune hemolytic anemia, unspecified: Secondary | ICD-10-CM | POA: Diagnosis present

## 2019-03-06 DIAGNOSIS — C8590 Non-Hodgkin lymphoma, unspecified, unspecified site: Secondary | ICD-10-CM | POA: Diagnosis present

## 2019-03-06 DIAGNOSIS — F39 Unspecified mood [affective] disorder: Secondary | ICD-10-CM | POA: Diagnosis present

## 2019-03-06 DIAGNOSIS — J441 Chronic obstructive pulmonary disease with (acute) exacerbation: Secondary | ICD-10-CM | POA: Diagnosis present

## 2019-03-06 DIAGNOSIS — I4510 Unspecified right bundle-branch block: Secondary | ICD-10-CM | POA: Diagnosis present

## 2019-03-06 DIAGNOSIS — Z79899 Other long term (current) drug therapy: Secondary | ICD-10-CM

## 2019-03-06 DIAGNOSIS — G893 Neoplasm related pain (acute) (chronic): Secondary | ICD-10-CM | POA: Diagnosis not present

## 2019-03-06 DIAGNOSIS — S72002A Fracture of unspecified part of neck of left femur, initial encounter for closed fracture: Secondary | ICD-10-CM | POA: Diagnosis present

## 2019-03-06 DIAGNOSIS — Z7952 Long term (current) use of systemic steroids: Secondary | ICD-10-CM

## 2019-03-06 DIAGNOSIS — T380X5A Adverse effect of glucocorticoids and synthetic analogues, initial encounter: Secondary | ICD-10-CM | POA: Diagnosis not present

## 2019-03-06 DIAGNOSIS — Z9981 Dependence on supplemental oxygen: Secondary | ICD-10-CM

## 2019-03-06 DIAGNOSIS — I5032 Chronic diastolic (congestive) heart failure: Secondary | ICD-10-CM | POA: Diagnosis present

## 2019-03-06 DIAGNOSIS — Z91048 Other nonmedicinal substance allergy status: Secondary | ICD-10-CM | POA: Diagnosis not present

## 2019-03-06 DIAGNOSIS — Z886 Allergy status to analgesic agent status: Secondary | ICD-10-CM

## 2019-03-06 DIAGNOSIS — R0682 Tachypnea, not elsewhere classified: Secondary | ICD-10-CM

## 2019-03-06 DIAGNOSIS — J9611 Chronic respiratory failure with hypoxia: Secondary | ICD-10-CM | POA: Diagnosis present

## 2019-03-06 DIAGNOSIS — Z7983 Long term (current) use of bisphosphonates: Secondary | ICD-10-CM

## 2019-03-06 DIAGNOSIS — E559 Vitamin D deficiency, unspecified: Secondary | ICD-10-CM | POA: Diagnosis present

## 2019-03-06 DIAGNOSIS — G8929 Other chronic pain: Secondary | ICD-10-CM | POA: Diagnosis present

## 2019-03-06 DIAGNOSIS — T458X5A Adverse effect of other primarily systemic and hematological agents, initial encounter: Secondary | ICD-10-CM | POA: Diagnosis present

## 2019-03-06 DIAGNOSIS — E8889 Other specified metabolic disorders: Secondary | ICD-10-CM | POA: Diagnosis present

## 2019-03-06 DIAGNOSIS — R000 Tachycardia, unspecified: Secondary | ICD-10-CM | POA: Diagnosis not present

## 2019-03-06 DIAGNOSIS — Z9221 Personal history of antineoplastic chemotherapy: Secondary | ICD-10-CM

## 2019-03-06 DIAGNOSIS — I350 Nonrheumatic aortic (valve) stenosis: Secondary | ICD-10-CM | POA: Diagnosis not present

## 2019-03-06 DIAGNOSIS — Z7189 Other specified counseling: Secondary | ICD-10-CM

## 2019-03-06 DIAGNOSIS — M84750A Atypical femoral fracture, unspecified, initial encounter for fracture: Secondary | ICD-10-CM | POA: Diagnosis present

## 2019-03-06 DIAGNOSIS — M84352A Stress fracture, left femur, initial encounter for fracture: Secondary | ICD-10-CM | POA: Diagnosis present

## 2019-03-06 DIAGNOSIS — Z7982 Long term (current) use of aspirin: Secondary | ICD-10-CM

## 2019-03-06 DIAGNOSIS — Z87891 Personal history of nicotine dependence: Secondary | ICD-10-CM

## 2019-03-06 LAB — BASIC METABOLIC PANEL
Anion gap: 11 (ref 5–15)
BUN: 17 mg/dL (ref 8–23)
CO2: 30 mmol/L (ref 22–32)
Calcium: 8.7 mg/dL — ABNORMAL LOW (ref 8.9–10.3)
Chloride: 97 mmol/L — ABNORMAL LOW (ref 98–111)
Creatinine, Ser: 0.86 mg/dL (ref 0.44–1.00)
GFR calc Af Amer: >60 mL/min (ref >60)
GFR calc non Af Amer: >60 mL/min (ref >60)
Glucose, Bld: 110 mg/dL — ABNORMAL HIGH (ref 70–99)
Potassium: 3.9 mmol/L (ref 3.5–5.1)
Sodium: 138 mmol/L (ref 135–145)

## 2019-03-06 LAB — CBC WITH DIFFERENTIAL/PLATELET
Abs Immature Granulocytes: 0.07 10*3/uL (ref 0.00–0.07)
Basophils Absolute: 0.1 10*3/uL (ref 0.0–0.1)
Basophils Relative: 1 %
Eosinophils Absolute: 0.1 10*3/uL (ref 0.0–0.5)
Eosinophils Relative: 0 %
HCT: 40.2 % (ref 36.0–46.0)
Hemoglobin: 12.5 g/dL (ref 12.0–15.0)
Immature Granulocytes: 1 %
Lymphocytes Relative: 13 %
Lymphs Abs: 1.8 10*3/uL (ref 0.7–4.0)
MCH: 31.1 pg (ref 26.0–34.0)
MCHC: 31.1 g/dL (ref 30.0–36.0)
MCV: 100 fL (ref 80.0–100.0)
Monocytes Absolute: 1.1 10*3/uL — ABNORMAL HIGH (ref 0.1–1.0)
Monocytes Relative: 8 %
Neutro Abs: 11.4 10*3/uL — ABNORMAL HIGH (ref 1.7–7.7)
Neutrophils Relative %: 77 %
Platelets: 299 10*3/uL (ref 150–400)
RBC: 4.02 MIL/uL (ref 3.87–5.11)
RDW: 13.4 % (ref 11.5–15.5)
WBC: 14.6 10*3/uL — ABNORMAL HIGH (ref 4.0–10.5)
nRBC: 0 % (ref 0.0–0.2)

## 2019-03-06 MED ORDER — OXYCODONE HCL 5 MG PO TABS
10.0000 mg | ORAL_TABLET | Freq: Once | ORAL | Status: AC
  Administered 2019-03-06: 22:00:00 10 mg via ORAL
  Filled 2019-03-06: qty 2

## 2019-03-06 MED ORDER — ACETAMINOPHEN 500 MG PO TABS
1000.0000 mg | ORAL_TABLET | Freq: Once | ORAL | Status: AC
  Administered 2019-03-06: 1000 mg via ORAL
  Filled 2019-03-06: qty 2

## 2019-03-06 MED ORDER — KETOROLAC TROMETHAMINE 15 MG/ML IJ SOLN
15.0000 mg | Freq: Once | INTRAMUSCULAR | Status: AC
  Administered 2019-03-06: 15 mg via INTRAVENOUS
  Filled 2019-03-06: qty 1

## 2019-03-06 NOTE — ED Triage Notes (Signed)
Patient reports to the ED via EMS. Patient reportedly normally walks with a walker. Patient reports severe knee pain in the left knee. Patient's PCP did a steroid shot and patient reports worsening pain.  Patient is oxygent dependent at baseline.  Patient has a hx of lymphoma.  Patient reports pain 4/10

## 2019-03-06 NOTE — ED Notes (Signed)
Pt. Documented in error see above note in chart. 

## 2019-03-06 NOTE — ED Provider Notes (Signed)
[facility] DEPT Provider Note   CSN: [id number] Arrival date & time: 03/06/19  1802     History Chief Complaint  Patient presents with  . Leg Pain    Virginia Thompson is a 86 y.o. female.  86 yo F with a chief complaint of left leg pain.  This starts at her knee and then radiates up to the hip and then goes down the leg.  Going on for the past week.  Has seen multiple physicians for this.  Saw an orthopedic doctor yesterday and she had a injection into the knee.  Thought to be spinal pathology and there were plans to have her be seen by palliative care pain management, physical therapy or the spinal center.  Patient was making statements at that time that she could not walk with and was able to ambulate with a walker.  Typically does not require a walker to get around.  Patient told me that today she has been unable to get out of bed due to pain.  Denies loss of bowel or bladder denies loss of peritoneal sensation denies numbness or tingling to the leg.  Describes the pain as sharp and shooting.  No trauma.  No recent procedure to her back.  She actually has no pain in her back starts in her leg.  The history is provided by the patient.  Leg Pain Location:  Leg Time since incident:  2 weeks Injury: no   Leg location:  L leg Pain details:    Quality:  Aching   Radiates to:  Back   Severity:  Moderate   Onset quality:  Gradual   Duration:  1 week   Timing:  Constant   Progression:  Worsening Chronicity:  New Prior injury to area:  No Relieved by:  Nothing Worsened by:  Bearing weight and activity Ineffective treatments:  None tried Associated symptoms: no back pain and no fever        Past Medical History:  Diagnosis Date  . Blood transfusion without reported diagnosis   . Cancer ([place])   . CHF (congestive heart failure) ([place])   . Hypertension   . Lymphoma ([place])   . Sleep apnea     There are no problems to display for this  patient.   Past Surgical History:  Procedure Laterality Date  . ABDOMINAL HYSTERECTOMY    . APPENDECTOMY    . BACK SURGERY    . BREAST CYST ASPIRATION Bilateral   . CHOLECYSTECTOMY    . EYE SURGERY    . JOINT REPLACEMENT Left   . SPLENECTOMY    . TONSILLECTOMY       OB History   No obstetric history on file.     Family History  Problem Relation Age of Onset  . Breast cancer Neg Hx     Social History   Tobacco Use  . Smoking status: Former Smoker    Types: Cigarettes    Quit date: 04/09/2003    Years since quitting: 15.9  . Smokeless tobacco: Never Used  Substance Use Topics  . Alcohol use: No  . Drug use: No    Home Medications Prior to Admission medications   Medication Sig Start Date End Date Taking? Authorizing Provider  acetaminophen (TYLENOL) 500 MG tablet Take by mouth. 05/15/11   [provider]  albuterol (PROAIR HFA) 108 (90 Base) MCG/ACT inhaler INHALE TWO PUFFS FOUR TIMES A DAY AS NEEDED 04/12/17   [provider]  alendronate (FOSAMAX)  70 MG tablet TAKE ONE TABLET BY MOUTH EVERY 7 DAYS 11/05/17   [provider]  aspirin EC 81 MG tablet Take by mouth.    [provider]  cephALEXin (KEFLEX) 250 MG capsule Take 1 capsule (250 mg total) by mouth 4 (four) times daily. 04/08/16   Ward, Delice Bison, DO  Cholecalciferol (VITAMIN D3) 25 MCG (1000 UT) CAPS Take by mouth.    [provider]  docusate sodium (COLACE) 100 MG capsule Take by mouth.    [provider]  DULoxetine (CYMBALTA) 60 MG capsule TAKE ONE (1) CAPSULE EACH DAY 03/15/17   [provider]  esomeprazole (NEXIUM) 40 MG capsule TAKE ONE CAPSULE BY MOUTH DAILY BEFORE BREAKFAST 01/16/18   [provider]  folic acid (FOLVITE) 1 MG tablet TAKE ONE (1) TABLET THREE (3) TIMES EACH DAY 08/01/17   [provider]  furosemide (LASIX) 40 MG tablet Take 1.5 tablets in the AM 11/28/17   [provider]  lovastatin (MEVACOR) 20 MG  tablet TAKE ONE TABLET BY MOUTH NIGHTLY 10/19/17   [provider]  metoprolol tartrate (LOPRESSOR) 25 MG tablet TAKE ONE-HALF TABLET BY MOUTH TWICE DAILY 08/01/17   [provider]  nystatin (MYCOSTATIN/NYSTOP) powder Apply topically. 06/10/15   [provider]  pramipexole (MIRAPEX) 1 MG tablet TAKE ONE TABLET (1 MG TOTAL) BY MOUTH NIGHTLY. 02/05/18   [provider]  predniSONE (DELTASONE) 5 MG tablet TAKE ONE TABLET (5MG  TOTAL) BY MOUTH DAILY 12/25/17   [provider]  sulfamethoxazole-trimethoprim (BACTRIM DS,SEPTRA DS) 800-160 MG tablet TAKE ONE TABLET BY MOUTH EVERY MONDAY, [place], AND FRIDAY 12/24/17   [provider]  tiotropium (SPIRIVA HANDIHALER) 18 MCG inhalation capsule PLACE ONE CAPSULE IN HANDIHALER AND INHALE ONCE DAILY. 04/12/17   [provider]    Allergies    Aleve [naproxen], Mobic [meloxicam], and Tape  Review of Systems   Review of Systems  Constitutional: Negative for chills and fever.  HENT: Negative for congestion and rhinorrhea.   Eyes: Negative for redness and visual disturbance.  Respiratory: Negative for shortness of breath and wheezing.   Cardiovascular: Negative for chest pain and palpitations.  Gastrointestinal: Negative for nausea and vomiting.  Genitourinary: Negative for dysuria and urgency.  Musculoskeletal: Positive for arthralgias and myalgias. Negative for back pain.  Skin: Negative for pallor and wound.  Neurological: Negative for dizziness and headaches.    Physical Exam Updated Vital Signs BP (!) 100/32 (BP Location: Left Arm)   Pulse 76   Temp 98.8 F (37.1 C) (Oral)   Resp 16   SpO2 93%   Physical Exam Vitals and nursing note reviewed.  Constitutional:      General: She is not in acute distress.    Appearance: She is well-developed. She is not diaphoretic.  HENT:     Head: Normocephalic and atraumatic.  Eyes:     Pupils: Pupils are equal, round, and reactive to light.   Cardiovascular:     Rate and Rhythm: Normal rate and regular rhythm.     Heart sounds: No murmur. No friction rub. No gallop.   Pulmonary:     Effort: Pulmonary effort is normal.     Breath sounds: No wheezing or rales.  Abdominal:     General: There is no distension.     Palpations: Abdomen is soft.     Tenderness: There is no abdominal tenderness.  Musculoskeletal:        General: No tenderness.     Cervical  back: Normal range of motion and neck supple.     Comments: Left lower extremity is significantly shorter than the right.  Pulse motor and sensation are intact distally.  There is no clonus reflexes are 2+ and equal.  Straight leg raise test is positive.  She has some pain with compression of the pubic ramus on the left.  Skin:    General: Skin is warm and dry.  Neurological:     Mental Status: She is alert and oriented to person, place, and time.  Psychiatric:        Behavior: Behavior normal.     ED Results / Procedures / Treatments   Labs (all labs ordered are listed, but only abnormal results are displayed) Labs Reviewed  CBC WITH DIFFERENTIAL/PLATELET - Abnormal; Notable for the following components:      Result Value   WBC 14.6 (*)    Neutro Abs 11.4 (*)    Monocytes Absolute 1.1 (*)    All other components within normal limits  BASIC METABOLIC PANEL - Abnormal; Notable for the following components:   Chloride 97 (*)    Glucose, Bld 110 (*)    Calcium 8.7 (*)    All other components within normal limits  SARS CORONAVIRUS 2 (TAT 6-24 HRS)    EKG None  Radiology CT L-SPINE NO CHARGE  Result Date: 03/06/2019 CLINICAL DATA:  Unable to walk.  Low back pain. EXAM: CT LUMBAR SPINE WITHOUT CONTRAST TECHNIQUE: Multidetector CT imaging of the lumbar spine was performed without intravenous contrast administration. Multiplanar CT image reconstructions were also generated. COMPARISON:  None. FINDINGS: Segmentation: 5 lumbar type vertebral bodies. Alignment: Mild  curvature convex to the left with the apex at L2-3. Vertebrae: Old minor superior endplate compression deformity on the left at L4. Old minor superior endplate compression deformity more on the right at L2. No evidence of recent fracture. No focal bone lesion. Paraspinal and other soft tissues: Nonobstructing stone in the lower pole the right kidney. Aortic atherosclerosis. Disc levels: T12-L1: Minimal disc bulge.  No stenosis. L1-2: Disc degeneration with loss of disc height. Endplate osteophytes and mild bulging of the disc. No compressive stenosis. L2-3: Minimal disc bulge. Minimal facet hypertrophy. No compressive stenosis. L3-4: Mild disc bulge. Mild facet hypertrophy. Mild narrowing of the lateral recesses but no definite compressive stenosis. L4-5: Moderate disc bulge more prominent towards the left. Facet and ligamentous hypertrophy. Stenosis of both lateral recesses left more than right. Neural compression could occur at this level, particularly on the left. L5-S1: Distant left hemilaminectomy. Disc space narrowing. Endplate osteophytes and mild annular bulging. No central canal stenosis. Mild bony foraminal narrowing on the left. Sacroiliac joints are unremarkable considering age. IMPRESSION: No evidence of recent fracture. Old minor superior endplate depression on the left at L4. Old minor superior endplate depression at L2, more on the right. No advanced spinal stenosis. Multilevel degenerative disc disease and degenerative facet disease, typical for age. At L4-5, there is bilateral lateral recess narrowing left more than right. Neural compression could possibly occur at this level, particularly on the left. At L5-S1, there has been a distant left hemilaminectomy. There is mild bony foraminal stenosis on the left at this level, not definitely compressive. Electronically Signed   By: Nelson Chimes M.D.   On: 03/06/2019 19:36   DG Knee Complete 4 Views Left  Result Date: 03/06/2019 CLINICAL DATA:  Recent  fall with left knee pain, initial encounter EXAM: LEFT KNEE - COMPLETE 4+ VIEW COMPARISON:  None.  FINDINGS: Partial knee replacement is noted laterally. Changes of prior fibular fracture are noted. Degenerative changes are noted in all 3 joint compartments. No joint effusion is seen. No acute fracture is seen. IMPRESSION: Chronic changes without acute abnormality. Electronically Signed   By: Inez Catalina M.D.   On: 03/06/2019 21:14   CT Renal Stone Study  Addendum Date: 03/06/2019   ADDENDUM REPORT: 03/06/2019 20:07 ADDENDUM: Upon further evaluation, an acute displaced fracture deformity of the proximal left femur is seen. This involves the proximal left femoral shaft, just distal to the inter trochanteric region. Approximately 1/2 shaft width medial displacement of the distal fracture site is noted. Electronically Signed   By: Virgina Norfolk M.D.   On: 03/06/2019 20:07   Result Date: 03/06/2019 CLINICAL DATA:  Flank pain. EXAM: CT ABDOMEN AND PELVIS WITHOUT CONTRAST TECHNIQUE: Multidetector CT imaging of the abdomen and pelvis was performed following the standard protocol without IV contrast. COMPARISON:  None. FINDINGS: Lower chest: Mild atelectasis is seen within the bilateral lung bases. Marked severity coronary artery calcification is seen. Hepatobiliary: Numerous punctate calcified granulomas are seen scattered throughout the liver. The gallbladder is not identified. Pancreas: Unremarkable. No pancreatic ductal dilatation or surrounding inflammatory changes. Spleen: The spleen is absent. Adrenals/Urinary Tract: Adrenal glands are unremarkable. Kidneys are normal, without focal lesions or hydronephrosis. A 7 mm nonobstructing renal stone is seen within the mid right kidney. Bladder is unremarkable. Stomach/Bowel: Stomach is within normal limits. The appendix is not identified. No evidence of bowel wall thickening, distention, or inflammatory changes. Noninflamed diverticula are seen throughout the  sigmoid colon. Vascular/Lymphatic: Marked severity aortic atherosclerosis. No enlarged abdominal or pelvic lymph nodes. Reproductive: Status post hysterectomy. No adnexal masses. Other: A surgical scar is seen along the midline of the anterior abdominal wall Musculoskeletal: Multilevel degenerative changes seen throughout the lumbar spine. IMPRESSION: 1. 7 mm nonobstructing renal stone within the right kidney. 2. Absent spleen. 3. Sigmoid diverticulosis. Electronically Signed: By: Virgina Norfolk M.D. On: 03/06/2019 19:49   DG Hip Unilat W or Wo Pelvis 2-3 Views Left  Result Date: 03/06/2019 CLINICAL DATA:  Recent fall with left hip pain, initial encounter EXAM: DG HIP (WITH OR WITHOUT PELVIS) 2-3V LEFT COMPARISON:  None. FINDINGS: Pelvic ring is intact. There is a transverse fracture through the proximal femoral shaft approximately 2 cm below the intratrochanteric region. Femoral head is well seated. No soft tissue abnormality is noted. Posterior displacement of the distal fracture fragment is seen. IMPRESSION: Transverse fracture as described in the proximal femoral shaft. Electronically Signed   By: Inez Catalina M.D.   On: 03/06/2019 21:14    Procedures Procedures (including critical care time)  Medications Ordered in ED Medications  ketorolac (TORADOL) 15 MG/ML injection 15 mg (has no administration in time range)  acetaminophen (TYLENOL) tablet 1,000 mg (has no administration in time range)  oxyCODONE (Oxy IR/ROXICODONE) immediate release tablet 10 mg (has no administration in time range)    ED Course  I have reviewed the triage vital signs and the nursing notes.  Pertinent labs & imaging results that were available during my care of the patient were reviewed by me and considered in my medical decision making (see chart for details).    MDM Rules/Calculators/A&P                      86 yo F with a chief complaint of left leg pain starts in the knee and then goes up the leg and down  the leg.  Thought to be spinal in nature though she has had no recent imaging as far as I can see in the computer.  Will obtain a CT scan.  Attempt to treat her pain here that the patient is chronically on narcotics which may be difficult to titrate.  CT scan viewed by me with the left hip fracture.  Discussed with the radiologist who confirmed.  Discussed with orthopedics, will independently evaluate the films recommending a plain film of the hip and knee.  Plain film confirms fracture.  Discussed again with Dr. Lyla Glassing, he spoke with the trauma orthopedic specialist: Recommended admission but to [facility] so that the surgery can be performed there.  The patients results and plan were reviewed and discussed.   Any x-rays performed were independently reviewed by myself.   Differential diagnosis were considered with the presenting HPI.  Medications  ketorolac (TORADOL) 15 MG/ML injection 15 mg (has no administration in time range)  acetaminophen (TYLENOL) tablet 1,000 mg (has no administration in time range)  oxyCODONE (Oxy IR/ROXICODONE) immediate release tablet 10 mg (has no administration in time range)    Vitals:   03/06/19 1817  BP: (!) 100/32  Pulse: 76  Resp: 16  Temp: 98.8 F (37.1 C)  TempSrc: Oral  SpO2: 93%    Final diagnoses:  Low back pain  Closed left hip fracture, initial encounter [facility])    Admission/ observation were discussed with the admitting physician, patient and/or family and they are comfortable with the plan.     Final Clinical Impression(s) / ED Diagnoses Final diagnoses:  Low back pain  Closed left hip fracture, initial encounter [facility])    Rx / Diagonal Orders ED Discharge Orders    None       Deno Etienne, DO 03/06/19 2135

## 2019-03-07 ENCOUNTER — Inpatient Hospital Stay (HOSPITAL_COMMUNITY): Payer: Medicare Other

## 2019-03-07 ENCOUNTER — Encounter (HOSPITAL_COMMUNITY): Payer: Self-pay | Admitting: Internal Medicine

## 2019-03-07 ENCOUNTER — Encounter (HOSPITAL_COMMUNITY)
Admission: EM | Disposition: A | Discharge status: Skilled Nursing Facility | Payer: Self-pay | Source: Home / Self Care | Attending: Family Medicine

## 2019-03-07 ENCOUNTER — Inpatient Hospital Stay (HOSPITAL_COMMUNITY): Payer: Medicare Other | Admitting: Anesthesiology

## 2019-03-07 DIAGNOSIS — D591 Autoimmune hemolytic anemia, unspecified: Secondary | ICD-10-CM | POA: Diagnosis present

## 2019-03-07 DIAGNOSIS — I5033 Acute on chronic diastolic (congestive) heart failure: Secondary | ICD-10-CM | POA: Diagnosis present

## 2019-03-07 HISTORY — PX: INTRAMEDULLARY (IM) NAIL INTERTROCHANTERIC: SHX5875

## 2019-03-07 LAB — CREATININE, SERUM
Creatinine, Ser: 0.94 mg/dL (ref 0.44–1.00)
GFR calc Af Amer: >60 mL/min (ref >60)
GFR calc non Af Amer: 54 mL/min — ABNORMAL LOW (ref >60)

## 2019-03-07 LAB — CBC
HCT: 26.4 % — ABNORMAL LOW (ref 36.0–46.0)
Hemoglobin: 8.4 g/dL — ABNORMAL LOW (ref 12.0–15.0)
MCH: 31.6 pg (ref 26.0–34.0)
MCHC: 31.8 g/dL (ref 30.0–36.0)
MCV: 99.2 fL (ref 80.0–100.0)
Platelets: 187 10*3/uL (ref 150–400)
RBC: 2.66 MIL/uL — ABNORMAL LOW (ref 3.87–5.11)
RDW: 13.6 % (ref 11.5–15.5)
WBC: 13.4 10*3/uL — ABNORMAL HIGH (ref 4.0–10.5)
nRBC: 0 % (ref 0.0–0.2)

## 2019-03-07 LAB — POCT I-STAT, CHEM 8
BUN: 17 mg/dL (ref 8–23)
Calcium, Ion: 1.11 mmol/L — ABNORMAL LOW (ref 1.15–1.40)
Chloride: 99 mmol/L (ref 98–111)
Creatinine, Ser: 0.7 mg/dL (ref 0.44–1.00)
Glucose, Bld: 156 mg/dL — ABNORMAL HIGH (ref 70–99)
HCT: 28 % — ABNORMAL LOW (ref 36.0–46.0)
Hemoglobin: 9.5 g/dL — ABNORMAL LOW (ref 12.0–15.0)
Potassium: 4 mmol/L (ref 3.5–5.1)
Sodium: 137 mmol/L (ref 135–145)
TCO2: 29 mmol/L (ref 22–32)

## 2019-03-07 LAB — LACTIC ACID, PLASMA
Lactic Acid, Venous: 1 mmol/L (ref 0.5–1.9)
Lactic Acid, Venous: 1.4 mmol/L (ref 0.5–1.9)

## 2019-03-07 LAB — POC SARS CORONAVIRUS 2 AG -  ED: SARS Coronavirus 2 Ag: NEGATIVE

## 2019-03-07 LAB — GLUCOSE, CAPILLARY: Glucose-Capillary: 90 mg/dL (ref 70–99)

## 2019-03-07 LAB — SARS CORONAVIRUS 2 (TAT 6-24 HRS): SARS Coronavirus 2: NEGATIVE

## 2019-03-07 SURGERY — FIXATION, FRACTURE, INTERTROCHANTERIC, WITH INTRAMEDULLARY ROD
Anesthesia: General | Site: Leg Upper | Laterality: Left

## 2019-03-07 MED ORDER — CEFAZOLIN SODIUM-DEXTROSE 2-4 GM/100ML-% IV SOLN
2.0000 g | INTRAVENOUS | Status: AC
  Administered 2019-03-07: 2 g via INTRAVENOUS

## 2019-03-07 MED ORDER — ENOXAPARIN SODIUM 40 MG/0.4ML ~~LOC~~ SOLN
40.0000 mg | SUBCUTANEOUS | Status: DC
  Administered 2019-03-08: 40 mg via SUBCUTANEOUS
  Filled 2019-03-07: qty 0.4

## 2019-03-07 MED ORDER — ACETAMINOPHEN 10 MG/ML IV SOLN
INTRAVENOUS | Status: AC
  Filled 2019-03-07: qty 100

## 2019-03-07 MED ORDER — METOPROLOL TARTRATE 12.5 MG HALF TABLET: 12.5000 mg | ORAL_TABLET | Freq: Two times a day (BID) | ORAL | Status: DC

## 2019-03-07 MED ORDER — CEFAZOLIN SODIUM-DEXTROSE 1-4 GM/50ML-% IV SOLN
1.0000 g | Freq: Four times a day (QID) | INTRAVENOUS | Status: AC
  Administered 2019-03-07 – 2019-03-08 (×2): 1 g via INTRAVENOUS
  Filled 2019-03-07 (×3): qty 50

## 2019-03-07 MED ORDER — ALBUMIN HUMAN 5 % IV SOLN
INTRAVENOUS | Status: AC
  Administered 2019-03-07: 12.5 g via INTRAVENOUS
  Filled 2019-03-07: qty 250

## 2019-03-07 MED ORDER — OXYCODONE HCL 5 MG/5ML PO SOLN: 5.0000 mg | Freq: Once | ORAL | Status: DC | PRN

## 2019-03-07 MED ORDER — ONDANSETRON HCL 4 MG/2ML IJ SOLN: 4.0000 mg | Freq: Once | INTRAMUSCULAR | Status: DC | PRN

## 2019-03-07 MED ORDER — BUPRENORPHINE 5 MCG/HR TD PTWK: 1.0000 | MEDICATED_PATCH | TRANSDERMAL | Status: DC

## 2019-03-07 MED ORDER — ACETAMINOPHEN 325 MG PO TABS
325.0000 mg | ORAL_TABLET | Freq: Four times a day (QID) | ORAL | Status: DC | PRN
  Administered 2019-03-08 – 2019-03-13 (×7): 650 mg via ORAL
  Filled 2019-03-07 (×7): qty 2

## 2019-03-07 MED ORDER — ONDANSETRON HCL 4 MG/2ML IJ SOLN
INTRAMUSCULAR | Status: AC
  Filled 2019-03-07: qty 2

## 2019-03-07 MED ORDER — PHENYLEPHRINE HCL-NACL 10-0.9 MG/250ML-% IV SOLN
INTRAVENOUS | Status: DC | PRN
  Administered 2019-03-07: 40 ug/min via INTRAVENOUS

## 2019-03-07 MED ORDER — UMECLIDINIUM BROMIDE 62.5 MCG/INH IN AEPB
1.0000 | INHALATION_SPRAY | Freq: Every day | RESPIRATORY_TRACT | Status: DC
  Administered 2019-03-08 – 2019-03-10 (×3): 1 via RESPIRATORY_TRACT
  Filled 2019-03-07 (×2): qty 7

## 2019-03-07 MED ORDER — CEFAZOLIN SODIUM-DEXTROSE 2-4 GM/100ML-% IV SOLN
2.0000 g | INTRAVENOUS | Status: AC
  Filled 2019-03-07 (×2): qty 100

## 2019-03-07 MED ORDER — ALBUTEROL SULFATE (2.5 MG/3ML) 0.083% IN NEBU: 2.5000 mg | INHALATION_SOLUTION | Freq: Four times a day (QID) | RESPIRATORY_TRACT | Status: DC | PRN

## 2019-03-07 MED ORDER — METOCLOPRAMIDE HCL 5 MG PO TABS: 5.0000 mg | ORAL_TABLET | Freq: Three times a day (TID) | ORAL | Status: DC | PRN

## 2019-03-07 MED ORDER — ALBUMIN HUMAN 5 % IV SOLN: INTRAVENOUS | Status: DC | PRN

## 2019-03-07 MED ORDER — MORPHINE SULFATE (PF) 2 MG/ML IV SOLN
0.5000 mg | INTRAVENOUS | Status: DC | PRN
  Administered 2019-03-07: 0.5 mg via INTRAVENOUS
  Filled 2019-03-07: qty 1

## 2019-03-07 MED ORDER — PROPOFOL 10 MG/ML IV BOLUS
INTRAVENOUS | Status: DC | PRN
  Administered 2019-03-07: 80 mg via INTRAVENOUS

## 2019-03-07 MED ORDER — CHLORHEXIDINE GLUCONATE 4 % EX LIQD: 60.0000 mL | Freq: Once | CUTANEOUS | Status: DC

## 2019-03-07 MED ORDER — PHENYLEPHRINE HCL (PRESSORS) 10 MG/ML IV SOLN
INTRAVENOUS | Status: DC | PRN
  Administered 2019-03-07 (×2): 120 ug via INTRAVENOUS

## 2019-03-07 MED ORDER — LACTATED RINGERS IV SOLN: INTRAVENOUS | Status: DC

## 2019-03-07 MED ORDER — LIDOCAINE HCL (CARDIAC) PF 100 MG/5ML IV SOSY
PREFILLED_SYRINGE | INTRAVENOUS | Status: DC | PRN
  Administered 2019-03-07: 40 mg via INTRAVENOUS

## 2019-03-07 MED ORDER — LACTATED RINGERS IV SOLN: INTRAVENOUS | Status: DC | PRN

## 2019-03-07 MED ORDER — ACETAMINOPHEN 10 MG/ML IV SOLN
1000.0000 mg | Freq: Once | INTRAVENOUS | Status: DC | PRN
  Administered 2019-03-07: 1000 mg via INTRAVENOUS

## 2019-03-07 MED ORDER — FENTANYL CITRATE (PF) 100 MCG/2ML IJ SOLN
25.0000 ug | INTRAMUSCULAR | Status: DC | PRN
  Administered 2019-03-07: 25 ug via INTRAVENOUS

## 2019-03-07 MED ORDER — FENTANYL CITRATE (PF) 100 MCG/2ML IJ SOLN
INTRAMUSCULAR | Status: DC | PRN
  Administered 2019-03-07: 100 ug via INTRAVENOUS
  Administered 2019-03-07 (×8): 50 ug via INTRAVENOUS

## 2019-03-07 MED ORDER — 0.9 % SODIUM CHLORIDE (POUR BTL) OPTIME
TOPICAL | Status: DC | PRN
  Administered 2019-03-07: 2000 mL

## 2019-03-07 MED ORDER — FENTANYL CITRATE (PF) 250 MCG/5ML IJ SOLN
INTRAMUSCULAR | Status: AC
  Filled 2019-03-07: qty 5

## 2019-03-07 MED ORDER — ROCURONIUM BROMIDE 100 MG/10ML IV SOLN
INTRAVENOUS | Status: DC | PRN
  Administered 2019-03-07: 20 mg via INTRAVENOUS
  Administered 2019-03-07: 40 mg via INTRAVENOUS
  Administered 2019-03-07: 20 mg via INTRAVENOUS

## 2019-03-07 MED ORDER — VASOPRESSIN 20 UNIT/ML IV SOLN
INTRAVENOUS | Status: AC
  Filled 2019-03-07: qty 1

## 2019-03-07 MED ORDER — OXYCODONE HCL 5 MG PO TABS: 5.0000 mg | ORAL_TABLET | Freq: Once | ORAL | Status: DC | PRN

## 2019-03-07 MED ORDER — ONDANSETRON HCL 4 MG/2ML IJ SOLN: 4.0000 mg | Freq: Four times a day (QID) | INTRAMUSCULAR | Status: DC | PRN

## 2019-03-07 MED ORDER — ALBUMIN HUMAN 5 % IV SOLN: 12.5000 g | Freq: Once | INTRAVENOUS | Status: AC

## 2019-03-07 MED ORDER — HYDROCODONE-ACETAMINOPHEN 7.5-325 MG PO TABS
1.0000 | ORAL_TABLET | ORAL | Status: DC | PRN
  Administered 2019-03-08 – 2019-03-10 (×2): 2 via ORAL
  Administered 2019-03-11 – 2019-03-13 (×2): 1 via ORAL
  Filled 2019-03-07 (×3): qty 2
  Filled 2019-03-07: qty 1

## 2019-03-07 MED ORDER — ONDANSETRON HCL 4 MG PO TABS: 4.0000 mg | ORAL_TABLET | Freq: Four times a day (QID) | ORAL | Status: DC | PRN

## 2019-03-07 MED ORDER — HYDROCODONE-ACETAMINOPHEN 5-325 MG PO TABS
1.0000 | ORAL_TABLET | ORAL | Status: DC | PRN
  Administered 2019-03-10 – 2019-03-13 (×9): 1 via ORAL
  Filled 2019-03-07 (×7): qty 1
  Filled 2019-03-07: qty 2
  Filled 2019-03-07: qty 1

## 2019-03-07 MED ORDER — SUGAMMADEX SODIUM 200 MG/2ML IV SOLN
INTRAVENOUS | Status: DC | PRN
  Administered 2019-03-07: 200 mg via INTRAVENOUS

## 2019-03-07 MED ORDER — FENTANYL CITRATE (PF) 100 MCG/2ML IJ SOLN
INTRAMUSCULAR | Status: AC
  Filled 2019-03-07: qty 2

## 2019-03-07 MED ORDER — DEXAMETHASONE SODIUM PHOSPHATE 10 MG/ML IJ SOLN
INTRAMUSCULAR | Status: DC | PRN
  Administered 2019-03-07: 5 mg via INTRAVENOUS

## 2019-03-07 MED ORDER — MORPHINE SULFATE (PF) 2 MG/ML IV SOLN
0.5000 mg | INTRAVENOUS | Status: DC | PRN
  Administered 2019-03-12: 1 mg via INTRAVENOUS
  Filled 2019-03-07: qty 1

## 2019-03-07 MED ORDER — ONDANSETRON HCL 4 MG/2ML IJ SOLN
INTRAMUSCULAR | Status: DC | PRN
  Administered 2019-03-07: 4 mg via INTRAVENOUS

## 2019-03-07 MED ORDER — METOCLOPRAMIDE HCL 5 MG/ML IJ SOLN: 5.0000 mg | Freq: Three times a day (TID) | INTRAMUSCULAR | Status: DC | PRN

## 2019-03-07 MED ORDER — PHENYLEPHRINE 40 MCG/ML (10ML) SYRINGE FOR IV PUSH (FOR BLOOD PRESSURE SUPPORT)
PREFILLED_SYRINGE | INTRAVENOUS | Status: AC
  Filled 2019-03-07: qty 40

## 2019-03-07 MED ORDER — SODIUM CHLORIDE 0.9 % IV BOLUS
1000.0000 mL | Freq: Once | INTRAVENOUS | Status: AC
  Administered 2019-03-07: 1000 mL via INTRAVENOUS

## 2019-03-07 MED ORDER — PROPOFOL 10 MG/ML IV BOLUS
INTRAVENOUS | Status: AC
  Filled 2019-03-07: qty 20

## 2019-03-07 MED ORDER — FENTANYL CITRATE (PF) 100 MCG/2ML IJ SOLN: 25.0000 ug | INTRAMUSCULAR | Status: DC | PRN

## 2019-03-07 MED ORDER — HYDROCORTISONE NA SUCCINATE PF 100 MG IJ SOLR
50.0000 mg | Freq: Two times a day (BID) | INTRAMUSCULAR | Status: DC
  Administered 2019-03-07: 50 mg via INTRAVENOUS
  Filled 2019-03-07: qty 2

## 2019-03-07 MED ORDER — DOCUSATE SODIUM 100 MG PO CAPS
100.0000 mg | ORAL_CAPSULE | Freq: Two times a day (BID) | ORAL | Status: DC
  Administered 2019-03-07 – 2019-03-13 (×11): 100 mg via ORAL
  Filled 2019-03-07 (×12): qty 1

## 2019-03-07 MED ORDER — POVIDONE-IODINE 10 % EX SWAB: 2.0000 "application " | Freq: Once | CUTANEOUS | Status: DC

## 2019-03-07 SURGICAL SUPPLY — 81 items
BIT DRILL 2.5X110 QC LCP DISP (BIT) ×2 IMPLANT
BIT DRILL 2.5X2.75 QC CALB (BIT) ×2 IMPLANT
BIT DRILL 4.3 FREE (DRILL) IMPLANT
BIT DRILL CALIBRTD FREE HND4.3 (BIT) IMPLANT
BIT DRILL CALIBRTD SHORT 4.9MM (BIT) IMPLANT
BIT DRILL SHORT CALI 4.9 CANN (DRILL) ×1 IMPLANT
BNDG COHESIVE 6X5 TAN STRL LF (GAUZE/BANDAGES/DRESSINGS) IMPLANT
BRUSH SCRUB EZ PLAIN DRY (MISCELLANEOUS) ×6 IMPLANT
COVER PERINEAL POST (MISCELLANEOUS) ×3 IMPLANT
COVER SURGICAL LIGHT HANDLE (MISCELLANEOUS) ×6 IMPLANT
COVER WAND RF STERILE (DRAPES) ×3 IMPLANT
DRAPE C-ARMOR (DRAPES) ×3 IMPLANT
DRAPE HALF SHEET 40X57 (DRAPES) IMPLANT
DRAPE ORTHO SPLIT 77X108 STRL (DRAPES)
DRAPE STERI IOBAN 125X83 (DRAPES) ×3 IMPLANT
DRAPE SURG ORHT 6 SPLT 77X108 (DRAPES) IMPLANT
DRAPE U-SHAPE 47X51 STRL (DRAPES) ×3 IMPLANT
DRESSING AQUACEL AG SP 3.5X10 (GAUZE/BANDAGES/DRESSINGS) IMPLANT
DRILL 4.3 FREE (DRILL) ×3
DRILL CALIBRATED FREE HAND 4.3 (BIT) ×3
DRILL CALIBRATED SHORT 4.9MM (BIT) ×3
DRILL SHORT CALI 4.9 CANN (DRILL) ×3
DRSG AQUACEL AG ADV 3.5X14 (GAUZE/BANDAGES/DRESSINGS) ×2 IMPLANT
DRSG AQUACEL AG SP 3.5X10 (GAUZE/BANDAGES/DRESSINGS) ×6
DRSG EMULSION OIL 3X3 NADH (GAUZE/BANDAGES/DRESSINGS) ×3 IMPLANT
DRSG MEPILEX BORDER 4X4 (GAUZE/BANDAGES/DRESSINGS) ×3 IMPLANT
DRSG MEPILEX BORDER 4X8 (GAUZE/BANDAGES/DRESSINGS) ×3 IMPLANT
DRSG MEPITEL 3X4 ME34 (GAUZE/BANDAGES/DRESSINGS) ×3 IMPLANT
ELECT REM PT RETURN 9FT ADLT (ELECTROSURGICAL) ×3
ELECTRODE REM PT RTRN 9FT ADLT (ELECTROSURGICAL) ×1 IMPLANT
GAUZE SPONGE 4X4 12PLY STRL (GAUZE/BANDAGES/DRESSINGS) ×3 IMPLANT
GLOVE BIO SURGEON STRL SZ7.5 (GLOVE) ×3 IMPLANT
GLOVE BIO SURGEON STRL SZ8 (GLOVE) ×3 IMPLANT
GLOVE BIOGEL PI IND STRL 7.5 (GLOVE) ×1 IMPLANT
GLOVE BIOGEL PI IND STRL 8 (GLOVE) ×1 IMPLANT
GLOVE BIOGEL PI INDICATOR 7.5 (GLOVE) ×2
GLOVE BIOGEL PI INDICATOR 8 (GLOVE) ×2
GOWN STRL REUS W/ TWL LRG LVL3 (GOWN DISPOSABLE) ×2 IMPLANT
GOWN STRL REUS W/ TWL XL LVL3 (GOWN DISPOSABLE) ×1 IMPLANT
GOWN STRL REUS W/TWL LRG LVL3 (GOWN DISPOSABLE) ×6
GOWN STRL REUS W/TWL XL LVL3 (GOWN DISPOSABLE) ×3
GUIDEWIRE BALL NOSE 100CM (WIRE) ×2 IMPLANT
KIT BASIN OR (CUSTOM PROCEDURE TRAY) ×3 IMPLANT
KIT TURNOVER KIT B (KITS) ×3 IMPLANT
MANIFOLD NEPTUNE II (INSTRUMENTS) ×3 IMPLANT
NAIL NAT 10X380 LEFT FEMUR (Nail) ×2 IMPLANT
NS IRRIG 1000ML POUR BTL (IV SOLUTION) ×3 IMPLANT
PACK GENERAL/GYN (CUSTOM PROCEDURE TRAY) ×3 IMPLANT
PAD ARMBOARD 7.5X6 YLW CONV (MISCELLANEOUS) ×6 IMPLANT
PIN GUIDE 3.0 THREADED (PIN) ×4 IMPLANT
PLATE 3.5MM LC-DCP 6H (Plate) ×2 IMPLANT
PLATE LOCK COMP 5H FOOT (Plate) ×4 IMPLANT
SCREW ANTE FA STD 6X60 (Screw) ×2 IMPLANT
SCREW BONE 5.0X35MM CORTICAL (Screw) ×2 IMPLANT
SCREW BONE 5.0X37.5MM CORT Z (Screw) ×3 IMPLANT
SCREW CANCELLOUS 6.0X95 TITNM (Screw) ×3 IMPLANT
SCREW CORTEX TI ST 3.5X38 (Screw) ×4 IMPLANT
SCREW CORTEX TI ST 3.5X45 (Screw) ×2 IMPLANT
SCREW CORTICAL 3.5MM  10MM (Screw) ×4 IMPLANT
SCREW CORTICAL 3.5MM  12MM (Screw) ×2 IMPLANT
SCREW CORTICAL 3.5MM  55MM (Screw) ×2 IMPLANT
SCREW CORTICAL 3.5MM 10MM (Screw) IMPLANT
SCREW CORTICAL 3.5MM 12MM (Screw) IMPLANT
SCREW CORTICAL 3.5MM 36MM (Screw) ×4 IMPLANT
SCREW CORTICAL 3.5MM 38MM (Screw) ×4 IMPLANT
SCREW CORTICAL 3.5MM 55MM (Screw) IMPLANT
SCREW CORTICAL HEXAGON 5.0X40 (Screw) ×2 IMPLANT
SCREW LOCK CORT STAR 3.5X12 (Screw) ×4 IMPLANT
SCREW LOCK CORT STAR 3.5X14 (Screw) ×3 IMPLANT
STAPLER VISISTAT 35W (STAPLE) ×3 IMPLANT
STOCKINETTE IMPERVIOUS LG (DRAPES) IMPLANT
SUT ETHILON 3 0 PS 1 (SUTURE) ×3 IMPLANT
SUT VIC AB 0 CT1 27 (SUTURE) ×3
SUT VIC AB 0 CT1 27XBRD ANBCTR (SUTURE) ×1 IMPLANT
SUT VIC AB 1 CT1 27 (SUTURE) ×3
SUT VIC AB 1 CT1 27XBRD ANBCTR (SUTURE) ×1 IMPLANT
SUT VIC AB 2-0 CT1 27 (SUTURE) ×3
SUT VIC AB 2-0 CT1 TAPERPNT 27 (SUTURE) ×1 IMPLANT
TOWEL GREEN STERILE (TOWEL DISPOSABLE) ×6 IMPLANT
TOWEL GREEN STERILE FF (TOWEL DISPOSABLE) ×3 IMPLANT
WATER STERILE IRR 1000ML POUR (IV SOLUTION) ×3 IMPLANT

## 2019-03-07 NOTE — Anesthesia Procedure Notes (Signed)
Procedure Name: Intubation Date/Time: 03/07/2019 1:36 PM Performed by: Lorretta Kerce T, CRNA Pre-anesthesia Checklist: Patient identified, Emergency Drugs available, Suction available and Patient being monitored Patient Re-evaluated:Patient Re-evaluated prior to induction Oxygen Delivery Method: Circle system utilized Preoxygenation: Pre-oxygenation with 100% oxygen Induction Type: IV induction Ventilation: Mask ventilation without difficulty Laryngoscope Size: Miller and 2 Grade View: Grade I Tube type: Oral Tube size: 7.5 mm Number of attempts: 1 Airway Equipment and Method: Stylet and Oral airway Placement Confirmation: ETT inserted through vocal cords under direct vision,  positive ETCO2 and breath sounds checked- equal and bilateral Secured at: 22 cm Tube secured with: Tape Dental Injury: Teeth and Oropharynx as per pre-operative assessment

## 2019-03-07 NOTE — Transfer of Care (Signed)
Immediate Anesthesia Transfer of Care Note  Patient: Virginia Thompson  Procedure(s) Performed: INTRAMEDULLARY (IM) NAIL INTERTROCHANTRIC (Left Leg Upper)  Patient Location: PACU  Anesthesia Type:General  Level of Consciousness: awake, alert  and oriented  Airway & Oxygen Therapy: Patient Spontanous Breathing and Patient connected to nasal cannula oxygen  Post-op Assessment: Report given to RN, Post -op Vital signs reviewed and stable and Patient moving all extremities  Post vital signs: Reviewed and stable  Last Vitals:  Vitals Value Taken Time  BP 85/40 03/07/19 1852  Temp 36.7 C 03/07/19 1852  Pulse 103 03/07/19 1901  Resp 23 03/07/19 1901  SpO2 98 % 03/07/19 1901  Vitals shown include unvalidated device data.  Last Pain:  Vitals:   03/07/19 1853  TempSrc:   PainSc: 0-No pain         Complications: No apparent anesthesia complications

## 2019-03-07 NOTE — Progress Notes (Signed)
Initial Nutrition Assessment  DOCUMENTATION CODES:      INTERVENTION:  RD will follow surgery outcome and diet advancement and make further recommendations as appropriate .   NUTRITION DIAGNOSIS:  Inadequate oral intake related to inability to eat(pending surgery on left hip) as evidenced by NPO status.   GOAL:  Patient will meet greater than or equal to 90% of their needs   MONITOR:  Diet advancement, PO intake, Skin, I & O's, Labs, Weight trends, Supplement acceptance  REASON FOR ASSESSMENT:   Consult Hip fracture protocol  ASSESSMENT: Patient is an obese 86 yo female who has a hx of CHF, HTN, lymphoma. Autoimmune hemolytic anemia. Chronic prednisone therapy.  Presents with complaint of leg pain. CT findings - closed left hip fracture.   Patient is NPO for surgery (IMN-today).  Unable to assess diet history at this time. BP-76/45.  Review of weight trends show: Last entry 04/08/26-pt wt 96.2 kg. Minimal change noted the past ~3 years.   No intake or output data in the 24 hours ending 03/07/19 1204   Medications reviewed and include: solu-cortef, ancef   Labs: BMP Latest Ref Rng & Units 03/06/2019 04/08/2016  Glucose 70 - 99 mg/dL 110(H) 106(H)  BUN 8 - 23 mg/dL 17 17  Creatinine 0.44 - 1.00 mg/dL 0.86 1.00  Sodium 135 - 145 mmol/L 138 137  Potassium 3.5 - 5.1 mmol/L 3.9 4.5  Chloride 98 - 111 mmol/L 97(L) 99(L)  CO2 22 - 32 mmol/L 30 29  Calcium 8.9 - 10.3 mg/dL 8.7(L) 8.6(L)     NUTRITION - FOCUSED PHYSICAL EXAM:  Unable to complete Nutrition-Focused physical exam at this time.  RD working remotely.    Diet Order:   Diet Order            Diet NPO time specified  Diet effective now              EDUCATION NEEDS:  Not appropriate for education at this time Skin:  Skin Assessment: Reviewed RN Assessment(patient will have a surgical incision left hip)  Last BM:  unknown  Height:   Ht Readings from Last 1 Encounters:  04/08/16 5\' 2"  (1.575 m)     Weight:   Wt Readings from Last 1 Encounters:  03/07/19 92 kg    Ideal Body Weight:  50 kg  BMI:  Body mass index is 37.1 kg/m.  Estimated Nutritional Needs:   Kcal:  [id number]  Protein:  85-89 gr  Fluid:  < 2 liters daily   Colman Cater MS,RD,CSG,LDN Office: [phone] Pager: [phone]

## 2019-03-07 NOTE — ED Notes (Signed)
[facility] contacted for transport of patient to [facility]. Paperwork has been completed and placed in patient's folder. Nurse Loreli Slot, RN, who will be taking over care at [facility] has been notified and given report.

## 2019-03-07 NOTE — Anesthesia Procedure Notes (Signed)
Arterial Line Insertion Start/End1/09/2019 12:45 PM, 03/07/2019 12:51 PM Performed by: Wilburn Cornelia, CRNA, CRNA  Patient location: Pre-op. Preanesthetic checklist: patient identified, IV checked, risks and benefits discussed, surgical consent, monitors and equipment checked, pre-op evaluation and timeout performed Right, radial was placed Catheter size: 20 G Hand hygiene performed  and maximum sterile barriers used   Attempts: 2 Procedure performed without using ultrasound guided technique. Following insertion, dressing applied and Biopatch. Post procedure assessment: normal  Patient tolerated the procedure well with no immediate complications.

## 2019-03-07 NOTE — H&P (Signed)
History and Physical    Virginia Thompson [id number] DOB: [birth year] DOA: 03/06/2019  PCP: Angeline Slim, MD  Patient coming from: Home.  Chief Complaint: Left hip pain.  HPI: Virginia Thompson is a 86 y.o. 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  Patient denies any fall.  Denies any fever chills chest pain or shortness of breath.  ED Course: In the ER patient had CT lumbar and CT renal study and x-rays which showed left transverse proximal femur fracture for which ER physician had discussed with on-call orthopedic surgeon Dr. Lyla Glassing will be planning to do surgery in the morning at [place] test came back negative.  Labs show mild leukocytosis.  EKG shows normal sinus rhythm with RBBB.  Review of Systems: As per HPI, rest all negative.   Past Medical History:  Diagnosis Date  . Blood transfusion without reported diagnosis   . Cancer ([place])   . CHF (congestive heart failure) ([place])   . Hypertension   . Lymphoma ([place])   . Sleep apnea     Past Surgical History:  Procedure Laterality Date  . ABDOMINAL HYSTERECTOMY    . APPENDECTOMY    . BACK SURGERY    . BREAST CYST ASPIRATION Bilateral   . CHOLECYSTECTOMY    . EYE SURGERY    . JOINT REPLACEMENT Left   . SPLENECTOMY    . TONSILLECTOMY       reports that she quit smoking about 15 years ago. Her smoking use included cigarettes. She has never used smokeless tobacco. She reports that she does not drink alcohol or use drugs.  Allergies  Allergen Reactions  . Aleve [Naproxen] Rash  . Mobic [Meloxicam] Rash  . Tape Rash    Family History  Problem Relation Age of Onset  . Breast cancer Neg Hx     Prior to Admission medications   Medication Sig Start Date End Date  Taking? Authorizing Provider  acetaminophen (TYLENOL) 500 MG tablet Take by mouth. 05/15/11  Yes [provider]  albuterol (PROAIR HFA) 108 (90 Base) MCG/ACT inhaler Inhale 2 puffs into the lungs every 6 (six) hours as needed for wheezing or shortness of breath.  04/12/17  Yes [provider]  alendronate (FOSAMAX) 70 MG tablet Take 70 mg by mouth once a week.  11/05/17  Yes [provider]  aspirin EC 81 MG tablet Take 81 mg by mouth daily.    Yes [provider]  buprenorphine (BUTRANS) 5 MCG/HR PTWK Place 1 patch onto the skin once a week. 01/30/19  Yes [provider]  docusate sodium (COLACE) 100 MG capsule Take 200 mg by mouth daily.    Yes [provider]  DULoxetine (CYMBALTA) 60 MG capsule Take 60 mg by mouth daily.  03/15/17  Yes [provider]  esomeprazole (NEXIUM) 40 MG capsule Take 40 mg by mouth daily.  01/16/18  Yes [provider]  folic acid (FOLVITE) 1 MG tablet Take 1 mg by mouth 3 (three) times daily.  08/01/17  Yes [provider]  furosemide (LASIX) 40 MG tablet Take 60 mg by mouth daily.  11/28/17  Yes [provider]  loratadine (CLARITIN) 10 MG tablet Take 10 mg by mouth daily.   Yes [provider]  metoprolol tartrate (LOPRESSOR) 25 MG tablet Take 12.5 mg by mouth 2 (two) times daily.  08/01/17  Yes [provider]  Multiple Vitamins-Minerals (ICAPS AREDS 2 PO) Take 2 tablets by mouth daily.   Yes [provider]  oxyCODONE (OXY IR/ROXICODONE) 5 MG immediate release tablet Take 5 mg by mouth every 4 (four) hours as needed. 02/17/19  Yes [provider]  pramipexole (MIRAPEX) 1 MG tablet Take 1 mg by mouth at bedtime.  02/05/18  Yes [provider]  predniSONE (DELTASONE) 5 MG tablet Take 5 mg by mouth daily.  12/25/17  Yes [provider]  sulfamethoxazole-trimethoprim (BACTRIM DS,SEPTRA DS) 800-160 MG tablet TAKE ONE TABLET BY MOUTH EVERY  MONDAY, [place], AND FRIDAY 12/24/17  Yes [provider]  tiotropium (SPIRIVA HANDIHALER) 18 MCG inhalation capsule Place 18 mcg into inhaler and inhale daily.  04/12/17  Yes [provider]  cephALEXin (KEFLEX) 250 MG capsule Take 1 capsule (250 mg total) by mouth 4 (four) times daily. Patient not taking: Reported on 03/06/2019 04/08/16   Ward, Delice Bison, DO    Physical Exam: Constitutional: Moderately built and nourished. Vitals:   03/06/19 1817 03/06/19 2306 03/07/19 0118 03/07/19 0232  BP: (!) 100/32 103/67 (!) 95/55 97/71  Pulse: 76 90 87 86  Resp: 16 18 (!) 22 18  Temp: 98.8 F (37.1 C)     TempSrc: Oral     SpO2: 93% 95% 98% 97%   Eyes: Anicteric no pallor. ENMT: No discharge from the ears eyes nose or mouth. Neck: No mass felt.  No neck rigidity. Respiratory: No rhonchi or crepitations. Cardiovascular: S1-S2 heard. Abdomen: Soft nontender bowel sound present. Musculoskeletal: Pain on moving left hip. Skin: No rash. Neurologic: Alert awake oriented to time place and person.  Moves all extremities. Psychiatric: Appears normal.   Labs on Admission: I have personally reviewed following labs and imaging studies  CBC: Recent Labs  Lab 03/06/19 1900  WBC 14.6*  NEUTROABS 11.4*  HGB 12.5  HCT 40.2  MCV 100.0  PLT 123XX123   Basic Metabolic Panel: Recent Labs  Lab 03/06/19 1900  NA 138  K 3.9  CL 97*  CO2 30  GLUCOSE 110*  BUN 17  CREATININE 0.86  CALCIUM 8.7*   GFR: CrCl cannot be calculated (Unknown ideal weight.). Liver Function Tests: No results for input(s): AST, ALT, ALKPHOS, BILITOT, PROT, ALBUMIN in the last 168 hours. No results for input(s): LIPASE, AMYLASE in the last 168 hours. No results for input(s): AMMONIA in the last 168 hours. Coagulation Profile: No results for input(s): INR, PROTIME in the last 168 hours. Cardiac Enzymes: No results for input(s): CKTOTAL, CKMB, CKMBINDEX, TROPONINI in the last 168 hours. BNP (last 3  results) No results for input(s): PROBNP in the last 8760 hours. HbA1C: No results for input(s): HGBA1C in the last 72 hours. CBG: No results for input(s): GLUCAP in the last 168 hours. Lipid Profile: No results for input(s): CHOL, HDL, LDLCALC, TRIG, CHOLHDL, LDLDIRECT in the last 72 hours. Thyroid Function Tests: No results for input(s): TSH, T4TOTAL, FREET4, T3FREE, THYROIDAB in the last 72 hours. Anemia Panel: No results for input(s): VITAMINB12, FOLATE, FERRITIN, TIBC, IRON, RETICCTPCT in the last 72 hours. Urine analysis: No results found for: COLORURINE, APPEARANCEUR, LABSPEC, PHURINE, GLUCOSEU, HGBUR, BILIRUBINUR, KETONESUR, PROTEINUR, UROBILINOGEN, NITRITE, LEUKOCYTESUR Sepsis Labs: @LABRCNTIP (procalcitonin:4,lacticidven:4) )No results found for this or any previous visit (from the past 240 hour(s)).   Radiological Exams on Admission: CT L-SPINE NO CHARGE  Result Date: 03/06/2019  CLINICAL DATA:  Unable to walk.  Low back pain. EXAM: CT LUMBAR SPINE WITHOUT CONTRAST TECHNIQUE: Multidetector CT imaging of the lumbar spine was performed without intravenous contrast administration. Multiplanar CT image reconstructions were also generated. COMPARISON:  None. FINDINGS: Segmentation: 5 lumbar type vertebral bodies. Alignment: Mild curvature convex to the left with the apex at L2-3. Vertebrae: Old minor superior endplate compression deformity on the left at L4. Old minor superior endplate compression deformity more on the right at L2. No evidence of recent fracture. No focal bone lesion. Paraspinal and other soft tissues: Nonobstructing stone in the lower pole the right kidney. Aortic atherosclerosis. Disc levels: T12-L1: Minimal disc bulge.  No stenosis. L1-2: Disc degeneration with loss of disc height. Endplate osteophytes and mild bulging of the disc. No compressive stenosis. L2-3: Minimal disc bulge. Minimal facet hypertrophy. No compressive stenosis. L3-4: Mild disc bulge. Mild facet  hypertrophy. Mild narrowing of the lateral recesses but no definite compressive stenosis. L4-5: Moderate disc bulge more prominent towards the left. Facet and ligamentous hypertrophy. Stenosis of both lateral recesses left more than right. Neural compression could occur at this level, particularly on the left. L5-S1: Distant left hemilaminectomy. Disc space narrowing. Endplate osteophytes and mild annular bulging. No central canal stenosis. Mild bony foraminal narrowing on the left. Sacroiliac joints are unremarkable considering age. IMPRESSION: No evidence of recent fracture. Old minor superior endplate depression on the left at L4. Old minor superior endplate depression at L2, more on the right. No advanced spinal stenosis. Multilevel degenerative disc disease and degenerative facet disease, typical for age. At L4-5, there is bilateral lateral recess narrowing left more than right. Neural compression could possibly occur at this level, particularly on the left. At L5-S1, there has been a distant left hemilaminectomy. There is mild bony foraminal stenosis on the left at this level, not definitely compressive. Electronically Signed   By: Nelson Chimes M.D.   On: 03/06/2019 19:36   DG Knee Complete 4 Views Left  Result Date: 03/06/2019 CLINICAL DATA:  Recent fall with left knee pain, initial encounter EXAM: LEFT KNEE - COMPLETE 4+ VIEW COMPARISON:  None. FINDINGS: Partial knee replacement is noted laterally. Changes of prior fibular fracture are noted. Degenerative changes are noted in all 3 joint compartments. No joint effusion is seen. No acute fracture is seen. IMPRESSION: Chronic changes without acute abnormality. Electronically Signed   By: Inez Catalina M.D.   On: 03/06/2019 21:14   CT Renal Stone Study  Addendum Date: 03/06/2019   ADDENDUM REPORT: 03/06/2019 20:07 ADDENDUM: Upon further evaluation, an acute displaced fracture deformity of the proximal left femur is seen. This involves the proximal left  femoral shaft, just distal to the inter trochanteric region. Approximately 1/2 shaft width medial displacement of the distal fracture site is noted. Electronically Signed   By: Virgina Norfolk M.D.   On: 03/06/2019 20:07   Result Date: 03/06/2019 CLINICAL DATA:  Flank pain. EXAM: CT ABDOMEN AND PELVIS WITHOUT CONTRAST TECHNIQUE: Multidetector CT imaging of the abdomen and pelvis was performed following the standard protocol without IV contrast. COMPARISON:  None. FINDINGS: Lower chest: Mild atelectasis is seen within the bilateral lung bases. Marked severity coronary artery calcification is seen. Hepatobiliary: Numerous punctate calcified granulomas are seen scattered throughout the liver. The gallbladder is not identified. Pancreas: Unremarkable. No pancreatic ductal dilatation or surrounding inflammatory changes. Spleen: The spleen is absent. Adrenals/Urinary Tract: Adrenal glands are unremarkable. Kidneys are normal, without focal lesions or hydronephrosis. A 7 mm nonobstructing renal stone  is seen within the mid right kidney. Bladder is unremarkable. Stomach/Bowel: Stomach is within normal limits. The appendix is not identified. No evidence of bowel wall thickening, distention, or inflammatory changes. Noninflamed diverticula are seen throughout the sigmoid colon. Vascular/Lymphatic: Marked severity aortic atherosclerosis. No enlarged abdominal or pelvic lymph nodes. Reproductive: Status post hysterectomy. No adnexal masses. Other: A surgical scar is seen along the midline of the anterior abdominal wall Musculoskeletal: Multilevel degenerative changes seen throughout the lumbar spine. IMPRESSION: 1. 7 mm nonobstructing renal stone within the right kidney. 2. Absent spleen. 3. Sigmoid diverticulosis. Electronically Signed: By: Virgina Norfolk M.D. On: 03/06/2019 19:49   DG Hip Unilat W or Wo Pelvis 2-3 Views Left  Result Date: 03/06/2019 CLINICAL DATA:  Recent fall with left hip pain, initial encounter  EXAM: DG HIP (WITH OR WITHOUT PELVIS) 2-3V LEFT COMPARISON:  None. FINDINGS: Pelvic ring is intact. There is a transverse fracture through the proximal femoral shaft approximately 2 cm below the intratrochanteric region. Femoral head is well seated. No soft tissue abnormality is noted. Posterior displacement of the distal fracture fragment is seen. IMPRESSION: Transverse fracture as described in the proximal femoral shaft. Electronically Signed   By: Inez Catalina M.D.   On: 03/06/2019 21:14    EKG: Independently reviewed.  Normal sinus rhythm with RBBB.  Assessment/Plan Principal Problem:   Closed left hip fracture, initial encounter [facility]) Active Problems:   Diastolic CHF, chronic (HCC)   Autoimmune hemolytic anemia    1. Left proximal femur fracture -we will keep patient n.p.o. after midnight except for medications.  Pain relief medications.  Dr. Lyla Glassing to see patient for possible surgery. 2. History of diastolic CHF on Lasix.  Will place it on hold until surgery. 3. Hypertension on metoprolol which will be continued. 4. History of autoimmune hemolytic anemia and lymphoma on chronic prednisone therapy.  For now I am placing patient on stress dose IV hydrocortisone which need to be changed back to prednisone after surgery.  Patient also takes prophylactic Bactrim which needs to be restarted after surgery. 5. History of chronic bronchitis on Spiriva. 6. Restless leg syndrome on Mirapex.  Given that patient has femur fracture and will need close monitoring will need inpatient status.   DVT prophylaxis: SCDs and pharmacological anticoagulation after surgery when okay with orthopedics. Code Status: Full code. Family Communication: Discussed with patient. Disposition Plan: May need rehab. Consults called: Orthopedics. Admission status: Inpatient.   Rise Patience MD [facility] Pager [phone].  If 7PM-7AM, please contact night-coverage [email or web address] Password  [facility]  03/07/2019, 4:03 AM

## 2019-03-07 NOTE — ED Notes (Signed)
Dr. Hal Hope confirmed that patient may have one last snack and liquids before becoming NPO. This RN provided the patient with water and a ham sandwich.

## 2019-03-07 NOTE — Progress Notes (Signed)
@   0845: pt has low Bp 76/45. Paged MD and received new order.

## 2019-03-07 NOTE — Anesthesia Preprocedure Evaluation (Addendum)
Anesthesia Evaluation  Patient identified by MRN, date of birth, ID band Patient awake    Reviewed: Allergy & Precautions, NPO status , Patient's Chart, lab work & pertinent test results  History of Anesthesia Complications Negative for: history of anesthetic complications  Airway Mallampati: II  TM Distance: >3 FB Neck ROM: Full    Dental  (+) Edentulous Upper, Edentulous Lower   Pulmonary sleep apnea , former smoker,    Pulmonary exam normal        Cardiovascular hypertension, Pt. on home beta blockers and Pt. on medications +CHF  + Valvular Problems/Murmurs AS  Rhythm:Regular Rate:Normal + Systolic murmurs  '20 TTE (Care Everywhere) - EF 65-70%. LA is moderately dilated. Trace AI, moderate AS. Mild MR, TR. Mild pulmonary hypertension. Estimated right ventricular systolic pressure is 49 mmHg.    Neuro/Psych negative neurological ROS  negative psych ROS   GI/Hepatic Neg liver ROS, GERD  Medicated,  Endo/Other  negative endocrine ROS  Renal/GU negative Renal ROS     Musculoskeletal negative musculoskeletal ROS (+)   Abdominal   Peds  Hematology  Lymphoma    Anesthesia Other Findings Covid neg 1/7  Reproductive/Obstetrics                            Anesthesia Physical Anesthesia Plan  ASA: III  Anesthesia Plan: General   Post-op Pain Management:    Induction: Intravenous  PONV Risk Score and Plan: 3 and Treatment may vary due to age or medical condition and Ondansetron  Airway Management Planned: Oral ETT  Additional Equipment: Arterial line  Intra-op Plan:   Post-operative Plan: Extubation in OR  Informed Consent: I have reviewed the patients History and Physical, chart, labs and discussed the procedure including the risks, benefits and alternatives for the proposed anesthesia with the patient or authorized representative who has indicated his/her understanding and  acceptance.     Dental advisory given  Plan Discussed with: CRNA and Anesthesiologist  Anesthesia Plan Comments:        Anesthesia Quick Evaluation

## 2019-03-07 NOTE — Consult Note (Signed)
Reason for Consult:Left femur fx Referring Physician: B Swinteck  Virginia Thompson is an 86 y.o. female.  HPI: Virginia Thompson has been c/o LLE pain for about a week. It has seemed to her to be mostly centered around her knee. She went and saw some at [facility] last week who x-rayed the knee and said it looked fine. The pain got much much worse to where she couldn't walk and came to the ED. Workup showed a left femur fracture and orthopedic surgery was consulted. She denies any falls or hx/o trauma. She lives at home with her daughter and usually ambulates with a RW.  Past Medical History:  Diagnosis Date  . Blood transfusion without reported diagnosis   . Cancer ([place])   . CHF (congestive heart failure) ([place])   . Hypertension   . Lymphoma ([place])   . Sleep apnea     Past Surgical History:  Procedure Laterality Date  . ABDOMINAL HYSTERECTOMY    . APPENDECTOMY    . BACK SURGERY    . BREAST CYST ASPIRATION Bilateral   . CHOLECYSTECTOMY    . EYE SURGERY    . JOINT REPLACEMENT Left   . SPLENECTOMY    . TONSILLECTOMY      Family History  Problem Relation Age of Onset  . Breast cancer Neg Hx     Social History:  reports that she quit smoking about 15 years ago. Her smoking use included cigarettes. She has never used smokeless tobacco. She reports that she does not drink alcohol or use drugs.  Allergies:  Allergies  Allergen Reactions  . Aleve [Naproxen] Rash  . Mobic [Meloxicam] Rash  . Tape Rash    Medications: I have reviewed the patient's current medications.  Results for orders placed or performed during the hospital encounter of 03/06/19 (from the past 48 hour(s))  CBC with Differential     Status: Abnormal   Collection Time: 03/06/19  7:00 PM  Result Value Ref Range   WBC 14.6 (H) 4.0 - 10.5 K/uL   RBC 4.02 3.87 - 5.11 MIL/uL   Hemoglobin 12.5 12.0 - 15.0 g/dL   HCT 40.2 36.0 - 46.0 %   MCV 100.0 80.0 - 100.0 fL   MCH 31.1 26.0 - 34.0 pg   MCHC 31.1 30.0 - 36.0 g/dL   RDW 13.4  11.5 - 15.5 %   Platelets 299 150 - 400 K/uL   nRBC 0.0 0.0 - 0.2 %   Neutrophils Relative % 77 %   Neutro Abs 11.4 (H) 1.7 - 7.7 K/uL   Lymphocytes Relative 13 %   Lymphs Abs 1.8 0.7 - 4.0 K/uL   Monocytes Relative 8 %   Monocytes Absolute 1.1 (H) 0.1 - 1.0 K/uL   Eosinophils Relative 0 %   Eosinophils Absolute 0.1 0.0 - 0.5 K/uL   Basophils Relative 1 %   Basophils Absolute 0.1 0.0 - 0.1 K/uL   Immature Granulocytes 1 %   Abs Immature Granulocytes 0.07 0.00 - 0.07 K/uL    Comment: Performed at [facility], [place] [address], [place], [place] [address]  Basic metabolic panel     Status: Abnormal   Collection Time: 03/06/19  7:00 PM  Result Value Ref Range   Sodium 138 135 - 145 mmol/L   Potassium 3.9 3.5 - 5.1 mmol/L   Chloride 97 (L) 98 - 111 mmol/L   CO2 30 22 - 32 mmol/L   Glucose, Bld 110 (H) 70 - 99 mg/dL   BUN  17 8 - 23 mg/dL   Creatinine, Ser 0.86 0.44 - 1.00 mg/dL   Calcium 8.7 (L) 8.9 - 10.3 mg/dL   GFR calc non Af Amer >60 >60 mL/min   GFR calc Af Amer >60 >60 mL/min   Anion gap 11 5 - 15    Comment: Performed at [facility], [place] [address], [place], [place] [address]  SARS CORONAVIRUS 2 (TAT 6-24 HRS) Nasopharyngeal Nasopharyngeal Swab     Status: None   Collection Time: 03/06/19  9:57 PM   Specimen: Nasopharyngeal Swab  Result Value Ref Range   SARS Coronavirus 2 NEGATIVE NEGATIVE    Comment: (NOTE) SARS-CoV-2 target nucleic acids are NOT DETECTED. The SARS-CoV-2 RNA is generally detectable in upper and lower respiratory specimens during the acute phase of infection. Negative results do not preclude SARS-CoV-2 infection, do not rule out co-infections with other pathogens, and should not be used as the sole basis for treatment or other patient management decisions. Negative results must be combined with clinical observations, patient history, and epidemiological information. The expected result is Negative. Fact Sheet for  Patients: SugarRoll.be Fact Sheet for Healthcare Providers: https://www.woods-mathews.com/ This test is not yet approved or cleared by the [place] FDA and  has been authorized for detection and/or diagnosis of SARS-CoV-2 by FDA under an Emergency Use Authorization (EUA). This EUA will remain  in effect (meaning this test can be used) for the duration of the COVID-19 declaration under Section 56 4(b)(1) of the Act, 21 U.S.C. section 360bbb-3(b)(1), unless the authorization is terminated or revoked sooner. Performed at [facility], [place] [address], [place], [place] [address]   POC SARS Coronavirus 2 Ag-ED - Nasal Swab (BD Veritor Kit)     Status: None   Collection Time: 03/07/19  6:20 AM  Result Value Ref Range   SARS Coronavirus 2 Ag NEGATIVE NEGATIVE    Comment: (NOTE) SARS-CoV-2 antigen NOT DETECTED.  Negative results are presumptive.  Negative results do not preclude SARS-CoV-2 infection and should not be used as the sole basis for treatment or other patient management decisions, including infection  control decisions, particularly in the presence of clinical signs and  symptoms consistent with COVID-19, or in those who have been in contact with the virus.  Negative results must be combined with clinical observations, patient history, and epidemiological information. The expected result is Negative. Fact Sheet for Patients: PodPark.tn Fact Sheet for Healthcare Providers: GiftContent.is This test is not yet approved or cleared by the [place] FDA and  has been authorized for detection and/or diagnosis of SARS-CoV-2 by FDA under an Emergency Use Authorization (EUA).  This EUA will remain in effect (meaning this test can be used) for the duration of  the COVID-19 de claration under Section 564(b)(1) of the Act, 21 U.S.C. section 360bbb-3(b)(1), unless the authorization  is terminated or revoked sooner.   Lactic acid, plasma     Status: None   Collection Time: 03/07/19  9:45 AM  Result Value Ref Range   Lactic Acid, Venous 1.0 0.5 - 1.9 mmol/L    Comment: Performed at [place] [address] [place],  [address]  Glucose, capillary     Status: None   Collection Time: 03/07/19 11:22 AM  Result Value Ref Range   Glucose-Capillary 90 70 - 99 mg/dL    CT L-SPINE NO CHARGE  Result Date: 03/06/2019 CLINICAL DATA:  Unable to walk.  Low back pain. EXAM: CT LUMBAR SPINE WITHOUT CONTRAST  TECHNIQUE: Multidetector CT imaging of the lumbar spine was performed without intravenous contrast administration. Multiplanar CT image reconstructions were also generated. COMPARISON:  None. FINDINGS: Segmentation: 5 lumbar type vertebral bodies. Alignment: Mild curvature convex to the left with the apex at L2-3. Vertebrae: Old minor superior endplate compression deformity on the left at L4. Old minor superior endplate compression deformity more on the right at L2. No evidence of recent fracture. No focal bone lesion. Paraspinal and other soft tissues: Nonobstructing stone in the lower pole the right kidney. Aortic atherosclerosis. Disc levels: T12-L1: Minimal disc bulge.  No stenosis. L1-2: Disc degeneration with loss of disc height. Endplate osteophytes and mild bulging of the disc. No compressive stenosis. L2-3: Minimal disc bulge. Minimal facet hypertrophy. No compressive stenosis. L3-4: Mild disc bulge. Mild facet hypertrophy. Mild narrowing of the lateral recesses but no definite compressive stenosis. L4-5: Moderate disc bulge more prominent towards the left. Facet and ligamentous hypertrophy. Stenosis of both lateral recesses left more than right. Neural compression could occur at this level, particularly on the left. L5-S1: Distant left hemilaminectomy. Disc space narrowing. Endplate osteophytes and mild annular bulging. No central canal stenosis. Mild bony foraminal  narrowing on the left. Sacroiliac joints are unremarkable considering age. IMPRESSION: No evidence of recent fracture. Old minor superior endplate depression on the left at L4. Old minor superior endplate depression at L2, more on the right. No advanced spinal stenosis. Multilevel degenerative disc disease and degenerative facet disease, typical for age. At L4-5, there is bilateral lateral recess narrowing left more than right. Neural compression could possibly occur at this level, particularly on the left. At L5-S1, there has been a distant left hemilaminectomy. There is mild bony foraminal stenosis on the left at this level, not definitely compressive. Electronically Signed   By: Nelson Chimes M.D.   On: 03/06/2019 19:36   DG Knee Complete 4 Views Left  Result Date: 03/06/2019 CLINICAL DATA:  Recent fall with left knee pain, initial encounter EXAM: LEFT KNEE - COMPLETE 4+ VIEW COMPARISON:  None. FINDINGS: Partial knee replacement is noted laterally. Changes of prior fibular fracture are noted. Degenerative changes are noted in all 3 joint compartments. No joint effusion is seen. No acute fracture is seen. IMPRESSION: Chronic changes without acute abnormality. Electronically Signed   By: Inez Catalina M.D.   On: 03/06/2019 21:14   CT Renal Stone Study  Addendum Date: 03/06/2019   ADDENDUM REPORT: 03/06/2019 20:07 ADDENDUM: Upon further evaluation, an acute displaced fracture deformity of the proximal left femur is seen. This involves the proximal left femoral shaft, just distal to the inter trochanteric region. Approximately 1/2 shaft width medial displacement of the distal fracture site is noted. Electronically Signed   By: Virgina Norfolk M.D.   On: 03/06/2019 20:07   Result Date: 03/06/2019 CLINICAL DATA:  Flank pain. EXAM: CT ABDOMEN AND PELVIS WITHOUT CONTRAST TECHNIQUE: Multidetector CT imaging of the abdomen and pelvis was performed following the standard protocol without IV contrast. COMPARISON:   None. FINDINGS: Lower chest: Mild atelectasis is seen within the bilateral lung bases. Marked severity coronary artery calcification is seen. Hepatobiliary: Numerous punctate calcified granulomas are seen scattered throughout the liver. The gallbladder is not identified. Pancreas: Unremarkable. No pancreatic ductal dilatation or surrounding inflammatory changes. Spleen: The spleen is absent. Adrenals/Urinary Tract: Adrenal glands are unremarkable. Kidneys are normal, without focal lesions or hydronephrosis. A 7 mm nonobstructing renal stone is seen within the mid right kidney. Bladder is unremarkable. Stomach/Bowel: Stomach is within normal limits.  The appendix is not identified. No evidence of bowel wall thickening, distention, or inflammatory changes. Noninflamed diverticula are seen throughout the sigmoid colon. Vascular/Lymphatic: Marked severity aortic atherosclerosis. No enlarged abdominal or pelvic lymph nodes. Reproductive: Status post hysterectomy. No adnexal masses. Other: A surgical scar is seen along the midline of the anterior abdominal wall Musculoskeletal: Multilevel degenerative changes seen throughout the lumbar spine. IMPRESSION: 1. 7 mm nonobstructing renal stone within the right kidney. 2. Absent spleen. 3. Sigmoid diverticulosis. Electronically Signed: By: Virgina Norfolk M.D. On: 03/06/2019 19:49   DG Hip Unilat W or Wo Pelvis 2-3 Views Left  Result Date: 03/06/2019 CLINICAL DATA:  Recent fall with left hip pain, initial encounter EXAM: DG HIP (WITH OR WITHOUT PELVIS) 2-3V LEFT COMPARISON:  None. FINDINGS: Pelvic ring is intact. There is a transverse fracture through the proximal femoral shaft approximately 2 cm below the intratrochanteric region. Femoral head is well seated. No soft tissue abnormality is noted. Posterior displacement of the distal fracture fragment is seen. IMPRESSION: Transverse fracture as described in the proximal femoral shaft. Electronically Signed   By: Inez Catalina  M.D.   On: 03/06/2019 21:14    Review of Systems  HENT: Negative for ear discharge, ear pain, hearing loss and tinnitus.   Eyes: Negative for photophobia and pain.  Respiratory: Negative for cough and shortness of breath.   Cardiovascular: Negative for chest pain.  Gastrointestinal: Negative for abdominal pain, nausea and vomiting.  Genitourinary: Negative for dysuria, flank pain, frequency and urgency.  Musculoskeletal: Positive for arthralgias (Left hip/knee). Negative for back pain, myalgias and neck pain.  Neurological: Negative for dizziness and headaches.  Hematological: Does not bruise/bleed easily.  Psychiatric/Behavioral: The patient is not nervous/anxious.    Blood pressure (!) 97/49, pulse 86, temperature 98.1 F (36.7 C), temperature source Oral, resp. rate 18, weight 92 kg, SpO2 100 %. Physical Exam  Constitutional: She appears well-developed and well-nourished. No distress.  HENT:  Head: Normocephalic and atraumatic.  Eyes: Conjunctivae are normal. Right eye exhibits no discharge. Left eye exhibits no discharge. No scleral icterus.  Cardiovascular: Normal rate and regular rhythm.  Respiratory: Effort normal. No respiratory distress.  Musculoskeletal:     Cervical back: Normal range of motion.     Comments: LLE No traumatic wounds, ecchymosis, or rash  Mod TTP hip  No knee or ankle effusion  Knee stable to varus/ valgus and anterior/posterior stress  Sens DPN, SPN, TN intact  Motor EHL, ext, flex, evers 5/5  DP 1+, PT 1+, No significant edema  Neurological: She is alert.  Skin: Skin is warm and dry. She is not diaphoretic.  Psychiatric: She has a normal mood and affect. Her behavior is normal.    Assessment/Plan: Left femur fx -- Plan IMN today by Dr. Marcelino Scot. Please keep NPO. 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

## 2019-03-07 NOTE — Progress Notes (Signed)
PROGRESS NOTE    Virginia Thompson  [id number] DOB: 84-21-1930 DOA: 03/06/2019 PCP: Angeline Slim, MD   Brief Narrative: Virginia Thompson is a 86 y.o. 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 Patient presented secondary to worsening left hip pain and found to have a left hip fracture.   Assessment & Plan:   Principal Problem:   Closed left hip fracture, initial encounter [facility]) Active Problems:   Diastolic CHF, chronic (HCC)   Autoimmune hemolytic anemia   Left hip fracture Does not appear to be secondary to trauma. Patient is on alendronate as an outpatient. Orthopedic surgery planning surgical management today. -NPO -Orthopedic surgery recommendations -Continue Morphine prn  Chronic diastolic heart failure Euvolemic at this time vs slightly dry. -Hold lasix and metoprolol  Hypotension No associated tachycardia. Normal lactic acid. -1L NS bolus  History of autoimmune hemolytic anemia Patient is on prednisone as an outpatient. Started on hydrocortisone for stress dosing -Continue hydrocortisone for now especially with soft BPs -Restart prednisone as able  Chronic bronchitis Patient on Spiriva and albuterol as an outpatient  Restless leg syndrome On Mirapex as an outpatient  Mood disorder On Cymbalta  Chronic pain -Continue buprenorphine  GERD On Nexium   DVT prophylaxis: SCDs Code Status:   Code Status: Full Code Family Communication: Daughter on telephone Disposition Plan: Discharge pending orthopedic surgery recommendations   Consultants:   Orthopedic surgery  Procedures:   None  Antimicrobials:  None    Subjective: Leg pain. No other issues.  Objective: Vitals:   03/07/19 0936 03/07/19 0946 03/07/19 1041 03/07/19 1054  BP: (!) 146/128 (!) 93/26 (!) 97/37 (!) 97/49  Pulse: 78 80 85 86  Resp:      Temp:        TempSrc:      SpO2:   100% 100%  Weight:        Intake/Output Summary (Last 24 hours) at 03/07/2019 1514 Last data filed at 03/07/2019 1501 Gross per 24 hour  Intake --  Output 200 ml  Net -200 ml   Filed Weights   03/07/19 0657  Weight: 92 kg    Examination:  General exam: Appears calm and comfortable Respiratory system: Clear to auscultation. Respiratory effort normal. Cardiovascular system: S1 & S2 heard, RRR. No murmurs, rubs, gallops or clicks. Gastrointestinal system: Abdomen is nondistended, soft and nontender. No organomegaly or masses felt. Normal bowel sounds heard. Central nervous system: Alert. No focal neurological deficits. Extremities: No edema. No calf tenderness Skin: No cyanosis. No rashes Psychiatry: Judgement and insight appear normal. Mood & affect appropriate.     Data Reviewed: I have personally reviewed following labs and imaging studies  CBC: Recent Labs  Lab 03/06/19 1900  WBC 14.6*  NEUTROABS 11.4*  HGB 12.5  HCT 40.2  MCV 100.0  PLT 123XX123   Basic Metabolic Panel: Recent Labs  Lab 03/06/19 1900  NA 138  K 3.9  CL 97*  CO2 30  GLUCOSE 110*  BUN 17  CREATININE 0.86  CALCIUM 8.7*   GFR: CrCl cannot be calculated (Unknown ideal weight.). Liver Function Tests: No results for input(s): AST, ALT, ALKPHOS, BILITOT, PROT, ALBUMIN in the last 168 hours. No results for input(s): LIPASE, AMYLASE in the last 168 hours. No results for input(s): AMMONIA in the last 168 hours. Coagulation Profile: No results for input(s): INR, PROTIME in the last 168 hours. Cardiac Enzymes: No results for input(s): CKTOTAL,  CKMB, CKMBINDEX, TROPONINI in the last 168 hours. BNP (last 3 results) No results for input(s): PROBNP in the last 8760 hours. HbA1C: No results for input(s): HGBA1C in the last 72 hours. CBG: Recent Labs  Lab 03/07/19 1122  GLUCAP 90   Lipid Profile: No results for input(s): CHOL, HDL, LDLCALC, TRIG, CHOLHDL, LDLDIRECT in the  last 72 hours. Thyroid Function Tests: No results for input(s): TSH, T4TOTAL, FREET4, T3FREE, THYROIDAB in the last 72 hours. Anemia Panel: No results for input(s): VITAMINB12, FOLATE, FERRITIN, TIBC, IRON, RETICCTPCT in the last 72 hours. Sepsis Labs: Recent Labs  Lab 03/07/19 0945  LATICACIDVEN 1.0    Recent Results (from the past 240 hour(s))  SARS CORONAVIRUS 2 (TAT 6-24 HRS) Nasopharyngeal Nasopharyngeal Swab     Status: None   Collection Time: 03/06/19  9:57 PM   Specimen: Nasopharyngeal Swab  Result Value Ref Range Status   SARS Coronavirus 2 NEGATIVE NEGATIVE Final    Comment: (NOTE) SARS-CoV-2 target nucleic acids are NOT DETECTED. The SARS-CoV-2 RNA is generally detectable in upper and lower respiratory specimens during the acute phase of infection. Negative results do not preclude SARS-CoV-2 infection, do not rule out co-infections with other pathogens, and should not be used as the sole basis for treatment or other patient management decisions. Negative results must be combined with clinical observations, patient history, and epidemiological information. The expected result is Negative. Fact Sheet for Patients: SugarRoll.be Fact Sheet for Healthcare Providers: https://www.woods-mathews.com/ This test is not yet approved or cleared by the [place] FDA and  has been authorized for detection and/or diagnosis of SARS-CoV-2 by FDA under an Emergency Use Authorization (EUA). This EUA will remain  in effect (meaning this test can be used) for the duration of the COVID-19 declaration under Section 56 4(b)(1) of the Act, 21 U.S.C. section 360bbb-3(b)(1), unless the authorization is terminated or revoked sooner. Performed at [facility], [place] [address], [place], [place] [address]          Radiology Studies: CT L-SPINE NO CHARGE  Result Date: 03/06/2019 CLINICAL DATA:  Unable to walk.  Low back pain. EXAM: CT  LUMBAR SPINE WITHOUT CONTRAST TECHNIQUE: Multidetector CT imaging of the lumbar spine was performed without intravenous contrast administration. Multiplanar CT image reconstructions were also generated. COMPARISON:  None. FINDINGS: Segmentation: 5 lumbar type vertebral bodies. Alignment: Mild curvature convex to the left with the apex at L2-3. Vertebrae: Old minor superior endplate compression deformity on the left at L4. Old minor superior endplate compression deformity more on the right at L2. No evidence of recent fracture. No focal bone lesion. Paraspinal and other soft tissues: Nonobstructing stone in the lower pole the right kidney. Aortic atherosclerosis. Disc levels: T12-L1: Minimal disc bulge.  No stenosis. L1-2: Disc degeneration with loss of disc height. Endplate osteophytes and mild bulging of the disc. No compressive stenosis. L2-3: Minimal disc bulge. Minimal facet hypertrophy. No compressive stenosis. L3-4: Mild disc bulge. Mild facet hypertrophy. Mild narrowing of the lateral recesses but no definite compressive stenosis. L4-5: Moderate disc bulge more prominent towards the left. Facet and ligamentous hypertrophy. Stenosis of both lateral recesses left more than right. Neural compression could occur at this level, particularly on the left. L5-S1: Distant left hemilaminectomy. Disc space narrowing. Endplate osteophytes and mild annular bulging. No central canal stenosis. Mild bony foraminal narrowing on the left. Sacroiliac joints are unremarkable considering age. IMPRESSION: No evidence of recent fracture. Old minor superior endplate depression on the left at L4. Old minor  superior endplate depression at L2, more on the right. No advanced spinal stenosis. Multilevel degenerative disc disease and degenerative facet disease, typical for age. At L4-5, there is bilateral lateral recess narrowing left more than right. Neural compression could possibly occur at this level, particularly on the left. At  L5-S1, there has been a distant left hemilaminectomy. There is mild bony foraminal stenosis on the left at this level, not definitely compressive. Electronically Signed   By: Nelson Chimes M.D.   On: 03/06/2019 19:36   DG Knee Complete 4 Views Left  Result Date: 03/06/2019 CLINICAL DATA:  Recent fall with left knee pain, initial encounter EXAM: LEFT KNEE - COMPLETE 4+ VIEW COMPARISON:  None. FINDINGS: Partial knee replacement is noted laterally. Changes of prior fibular fracture are noted. Degenerative changes are noted in all 3 joint compartments. No joint effusion is seen. No acute fracture is seen. IMPRESSION: Chronic changes without acute abnormality. Electronically Signed   By: Inez Catalina M.D.   On: 03/06/2019 21:14   CT Renal Stone Study  Addendum Date: 03/06/2019   ADDENDUM REPORT: 03/06/2019 20:07 ADDENDUM: Upon further evaluation, an acute displaced fracture deformity of the proximal left femur is seen. This involves the proximal left femoral shaft, just distal to the inter trochanteric region. Approximately 1/2 shaft width medial displacement of the distal fracture site is noted. Electronically Signed   By: Virgina Norfolk M.D.   On: 03/06/2019 20:07   Result Date: 03/06/2019 CLINICAL DATA:  Flank pain. EXAM: CT ABDOMEN AND PELVIS WITHOUT CONTRAST TECHNIQUE: Multidetector CT imaging of the abdomen and pelvis was performed following the standard protocol without IV contrast. COMPARISON:  None. FINDINGS: Lower chest: Mild atelectasis is seen within the bilateral lung bases. Marked severity coronary artery calcification is seen. Hepatobiliary: Numerous punctate calcified granulomas are seen scattered throughout the liver. The gallbladder is not identified. Pancreas: Unremarkable. No pancreatic ductal dilatation or surrounding inflammatory changes. Spleen: The spleen is absent. Adrenals/Urinary Tract: Adrenal glands are unremarkable. Kidneys are normal, without focal lesions or hydronephrosis. A 7 mm  nonobstructing renal stone is seen within the mid right kidney. Bladder is unremarkable. Stomach/Bowel: Stomach is within normal limits. The appendix is not identified. No evidence of bowel wall thickening, distention, or inflammatory changes. Noninflamed diverticula are seen throughout the sigmoid colon. Vascular/Lymphatic: Marked severity aortic atherosclerosis. No enlarged abdominal or pelvic lymph nodes. Reproductive: Status post hysterectomy. No adnexal masses. Other: A surgical scar is seen along the midline of the anterior abdominal wall Musculoskeletal: Multilevel degenerative changes seen throughout the lumbar spine. IMPRESSION: 1. 7 mm nonobstructing renal stone within the right kidney. 2. Absent spleen. 3. Sigmoid diverticulosis. Electronically Signed: By: Virgina Norfolk M.D. On: 03/06/2019 19:49   DG Hip Unilat W or Wo Pelvis 2-3 Views Left  Result Date: 03/06/2019 CLINICAL DATA:  Recent fall with left hip pain, initial encounter EXAM: DG HIP (WITH OR WITHOUT PELVIS) 2-3V LEFT COMPARISON:  None. FINDINGS: Pelvic ring is intact. There is a transverse fracture through the proximal femoral shaft approximately 2 cm below the intratrochanteric region. Femoral head is well seated. No soft tissue abnormality is noted. Posterior displacement of the distal fracture fragment is seen. IMPRESSION: Transverse fracture as described in the proximal femoral shaft. Electronically Signed   By: Inez Catalina M.D.   On: 03/06/2019 21:14        Scheduled Meds:  [MAR Hold] buprenorphine  1 patch Transdermal Weekly   chlorhexidine  60 mL Topical Once   [MAR Hold] hydrocortisone sod  succinate (SOLU-CORTEF) inj  50 mg Intravenous BID   povidone-iodine  2 application Topical Once   [MAR Hold] umeclidinium bromide  1 puff Inhalation Daily   Continuous Infusions:  [MAR Hold]  ceFAZolin (ANCEF) IV     lactated ringers 10 mL/hr at 03/07/19 1232     LOS: 1 day     Cordelia Poche, MD [facility] 03/07/2019, 3:14 PM  If 7PM-7AM, please contact night-coverage [email or web address]

## 2019-03-07 NOTE — Op Note (Signed)
6:24 PM  PATIENT:  Virginia Thompson 86 y.o.  PRE-OPERATIVE DIAGNOSIS:  LEFT FEMORAL SHAFT, ATYPICAL FRACTURE  POST-OPERATIVE DIAGNOSIS:  LEFT FEMORAL SHAFT, ATYPICAL FRACTURE  PROCEDURE:  Procedure(s): INTRAMEDULLARY (IM) NAILING OF LEFT FEMUR WITH OPEN REDUCTION USING PLATING AND 10 X 360 MM STATICALLY LOCKED ZIMMER NATURAL NAIL  SURGEON:  Surgeon(s) and Role:    Altamese St. David, MD - Primary  PHYSICIAN ASSISTANT: 1.  Ainsley Spinner, PA-C; 2. PA Student  ANESTHESIA:   general  I/O:  In: 1700 cryst, 250 cc albumin Out: 450 cc EBL, 300 UOP  SPECIMEN:  No Specimen  TOURNIQUET:  * No tourniquets in log *  DICTATION: Note written in EPIC  BRIEF SUMMARY OF INDICATIONS FOR PROCEDURE:  Virginia Thompson is a 86 y.o. who sustained a stress atypical proximal left femur fracture, associated with long term bisphosphonate use, resulting in a severely displaced left subtrochanteric proximal femoral shaft fracture.  I discussed with the patient and her daughter the risks and benefits of surgical fixation including potential for blood loss requiring transfusion, DVT, PE, malunion, nonunion, malalignment, particularly given the location as well as need for further surgery, as well as heart attack and stroke given her multiple comorbidities and past medical history.  After acknowledgement of these risks, consent was provided to proceed.  BRIEF SUMMARY OF PROCEDURE:  The patient was given preoperative antibiotics and taken to the operating room where general anesthesia was induced. The patient was positioned on the radiolucent flat table with all prominences padded appropriately. Chlorhexadine wash was performed and standard Betadine scrub and paint.  Standard prep and drape was performed subsequently.  C-arm was brought in to identify the correct starting points and fracture site.  Because of the severe displacement, decision was made to go ahead and proceed with open reduction of the fracture.   Extending the incision that would be used for placement of the femoral head lag screw, I incised the IT band in line with the incision, reflected the vastus anteriorly and under the fracture site.  There was severe posterior translation of the distal fragment.  Using a variety of techniques including plate application with a valgus bend, and clamp application, we were ultimately able to obtain a reduction and we could then ream concentrically placing the ball-tipped guidewire and then checking its position in the distal femur as well as proximally across the fracture site, making sure that it was anatomically reduced.  The skin incision was made proximal to greater trochanter.  Curved cannulated awl advanced to the correct starting position on AP and lateral views.  The threaded guidewire was advanced. We reamed up to 11.5 mm, placed a 10 x 360 mm rod to the appropriate depth.  I placed the pin for the proximal lag screw but had to removal the lateral plates which just barely obstructed lag screw placement. When the plate was removed the fracture shifted slightly into varus. I then placed a blocking screw in the proximal fragment and reinserted the nail which did help but the screw appeared to windshield wiper somewhat in the posterior cortex. 2 lag screws were placed using perfect circle technique distally.Consequently I then reapplied a compression plate laterally using titanium.   All screws and hardware position were checked on AP and lateral images for appropriate position. Ainsley Spinner, PA-C, assisted me throughout and assistant was absolutely necessary to produce reduction to allow for reaming.  Also assisted with placement of definitive fixation and combined closure to expedite our time in  the OR in hopes of reducing complications. Standard layered closure was performed, 0 Vicryl for the vastus, #1 Vicryl using figure-of-eight for the tensor or IT band and then 0 Vicryl, 2-0 Vicryl, and 2-0  nylon.  Sterile gently compressive dressing was applied.  The patient was awakened from anesthesia and transported to PACU in stable condition.  PROGNOSIS:  Virginia Thompson is at increased risk for multiple complications given her significant comorbidities. Her bone quality was quite poor and these fractures are associated with higher times to union and hardware failure among others. That being said, patient will be allowed to weightbear as tolerated and immediately mobilize. Formal DVT prophylaxis will be with Lovenox. We will continue to follow with the medical service and plan to see in the office for suture removal at 10 to 14 days.   Astrid Divine. Marcelino Scot, M.D.

## 2019-03-08 ENCOUNTER — Inpatient Hospital Stay (HOSPITAL_COMMUNITY): Payer: Medicare Other

## 2019-03-08 DIAGNOSIS — D62 Acute posthemorrhagic anemia: Secondary | ICD-10-CM

## 2019-03-08 DIAGNOSIS — G2581 Restless legs syndrome: Secondary | ICD-10-CM

## 2019-03-08 DIAGNOSIS — I350 Nonrheumatic aortic (valve) stenosis: Secondary | ICD-10-CM

## 2019-03-08 LAB — IRON AND TIBC
Iron: 13 ug/dL — ABNORMAL LOW (ref 28–170)
Saturation Ratios: 7 % — ABNORMAL LOW (ref 10.4–31.8)
TIBC: 175 ug/dL — ABNORMAL LOW (ref 250–450)
UIBC: 162 ug/dL

## 2019-03-08 LAB — CBC
HCT: 24.2 % — ABNORMAL LOW (ref 36.0–46.0)
Hemoglobin: 7.5 g/dL — ABNORMAL LOW (ref 12.0–15.0)
MCH: 31 pg (ref 26.0–34.0)
MCHC: 31 g/dL (ref 30.0–36.0)
MCV: 100 fL (ref 80.0–100.0)
Platelets: 186 10*3/uL (ref 150–400)
RBC: 2.42 MIL/uL — ABNORMAL LOW (ref 3.87–5.11)
RDW: 13.6 % (ref 11.5–15.5)
WBC: 13.7 10*3/uL — ABNORMAL HIGH (ref 4.0–10.5)
nRBC: 0 % (ref 0.0–0.2)

## 2019-03-08 LAB — BASIC METABOLIC PANEL
Anion gap: 7 (ref 5–15)
BUN: 17 mg/dL (ref 8–23)
CO2: 29 mmol/L (ref 22–32)
Calcium: 7.7 mg/dL — ABNORMAL LOW (ref 8.9–10.3)
Chloride: 104 mmol/L (ref 98–111)
Creatinine, Ser: 0.84 mg/dL (ref 0.44–1.00)
GFR calc Af Amer: >60 mL/min (ref >60)
GFR calc non Af Amer: >60 mL/min (ref >60)
Glucose, Bld: 153 mg/dL — ABNORMAL HIGH (ref 70–99)
Potassium: 4.2 mmol/L (ref 3.5–5.1)
Sodium: 140 mmol/L (ref 135–145)

## 2019-03-08 LAB — HEMOGLOBIN AND HEMATOCRIT, BLOOD
HCT: 23.8 % — ABNORMAL LOW (ref 36.0–46.0)
HCT: 24.7 % — ABNORMAL LOW (ref 36.0–46.0)
Hemoglobin: 7.3 g/dL — ABNORMAL LOW (ref 12.0–15.0)
Hemoglobin: 8 g/dL — ABNORMAL LOW (ref 12.0–15.0)

## 2019-03-08 LAB — LACTATE DEHYDROGENASE: LDH: 187 U/L (ref 98–192)

## 2019-03-08 LAB — GLUCOSE, CAPILLARY
Glucose-Capillary: 137 mg/dL — ABNORMAL HIGH (ref 70–99)
Glucose-Capillary: 128 mg/dL — ABNORMAL HIGH (ref 70–99)
Glucose-Capillary: 164 mg/dL — ABNORMAL HIGH (ref 70–99)

## 2019-03-08 LAB — ECHOCARDIOGRAM COMPLETE: Weight: 3481.5 oz

## 2019-03-08 LAB — PREPARE RBC (CROSSMATCH)

## 2019-03-08 LAB — FERRITIN: Ferritin: 128 ng/mL (ref 11–307)

## 2019-03-08 LAB — LACTIC ACID, PLASMA: Lactic Acid, Venous: 1.4 mmol/L (ref 0.5–1.9)

## 2019-03-08 LAB — ABO/RH: ABO/RH(D): A POS

## 2019-03-08 MED ORDER — SODIUM CHLORIDE 0.9 % IV SOLN: INTRAVENOUS | Status: AC

## 2019-03-08 MED ORDER — METHYLPREDNISOLONE SODIUM SUCC 40 MG IJ SOLR: 40.0000 mg | Freq: Two times a day (BID) | INTRAMUSCULAR | Status: DC

## 2019-03-08 MED ORDER — SODIUM CHLORIDE 0.9 % IV BOLUS
500.0000 mL | Freq: Once | INTRAVENOUS | Status: AC
  Administered 2019-03-08: 500 mL via INTRAVENOUS

## 2019-03-08 MED ORDER — MIDODRINE HCL 5 MG PO TABS
5.0000 mg | ORAL_TABLET | Freq: Three times a day (TID) | ORAL | Status: DC
  Administered 2019-03-08 – 2019-03-10 (×8): 5 mg via ORAL
  Filled 2019-03-08 (×7): qty 1

## 2019-03-08 MED ORDER — SODIUM CHLORIDE 0.9% IV SOLUTION: Freq: Once | INTRAVENOUS | Status: AC

## 2019-03-08 MED ORDER — HYDROCORTISONE NA SUCCINATE PF 100 MG IJ SOLR
100.0000 mg | Freq: Three times a day (TID) | INTRAMUSCULAR | Status: DC
  Administered 2019-03-08 – 2019-03-09 (×4): 100 mg via INTRAVENOUS
  Filled 2019-03-08 (×4): qty 2

## 2019-03-08 MED ORDER — CALCIUM CARBONATE ANTACID 500 MG PO CHEW
1.0000 | CHEWABLE_TABLET | Freq: Three times a day (TID) | ORAL | Status: DC | PRN
  Administered 2019-03-08 (×2): 200 mg via ORAL
  Filled 2019-03-08 (×2): qty 1

## 2019-03-08 NOTE — Anesthesia Postprocedure Evaluation (Signed)
Anesthesia Post Note  Patient: Virginia Thompson  Procedure(s) Performed: INTRAMEDULLARY (IM) NAIL INTERTROCHANTRIC (Left Leg Upper)     Patient location during evaluation: PACU Anesthesia Type: General Level of consciousness: awake Pain management: pain level controlled Vital Signs Assessment: post-procedure vital signs reviewed and stable Respiratory status: spontaneous breathing Cardiovascular status: stable Postop Assessment: no apparent nausea or vomiting Anesthetic complications: no    Last Vitals:  Vitals:   03/08/19 1050 03/08/19 1100  BP: (!) 82/64 (!) 69/47  Pulse: 97 99  Resp: 16 16  Temp: 37 C 36.9 C  SpO2: 97% 97%    Last Pain:  Vitals:   03/08/19 1100  TempSrc: Oral  PainSc:                  Azlyn Wingler

## 2019-03-08 NOTE — Progress Notes (Signed)
Dr Georgena Spurling, MD aware of hypotension, plan of care discussed.  Pt assessed at bedside. Q15 min VS, CTM.

## 2019-03-08 NOTE — Progress Notes (Signed)
MEWS 2: r/t hypotension, which is not acute.  Providers Dr. Cordelia Poche, MD aware.  Q15 VS per protocol. Per provider, Dr. Georgena Spurling, MD orders for [facility], monitor B/p and if hypotension persists may add midodrine.  No addition fluid boluses recommended by provider d/t possible fluid overload.

## 2019-03-08 NOTE — Progress Notes (Signed)
  Echocardiogram 2D Echocardiogram has been performed.  Virginia Thompson 03/08/2019, 2:33 PM

## 2019-03-08 NOTE — Progress Notes (Signed)
Patient briefly reached red mews score of 4. Repositioned O2 canula and gave patient a moment to relax. On reassessment, patient mews score of 3 due to heart rate and blood pressure. Neither are acute changes.

## 2019-03-08 NOTE — Progress Notes (Signed)
PROGRESS NOTE    [name]  [id number] DOB: 07/14/1928 DOA: 03/06/2019 PCP: Angeline Slim, MD   Brief Narrative: Virginia Thompson is a 86 y.o. 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 Patient presented secondary to worsening left hip pain and found to have a left hip fracture.   Assessment & Plan:   Principal Problem:   Closed left hip fracture, initial encounter [facility]) Active Problems:   Diastolic CHF, chronic (HCC)   Autoimmune hemolytic anemia   Left hip fracture Does not appear to be secondary to trauma. Patient is on alendronate as an outpatient. Orthopedic surgery planning surgical management today. -Orthopedic surgery recommendations -Continue Morphine prn  Chronic diastolic heart failure Euvolemic at this time vs slightly dry. -Hold lasix and metoprolol  Hypotension Patient has received multiple boluses with minimal improvement. Lactic acid normal. Patient otherwise asymptomatic. Appears patient has chronic soft blood pressure with a history of hypotension in the past -Increase Solu-Cortef -Blood transfusion pending (irradiated blood) -If no improvement, will add midodrine  History of autoimmune hemolytic anemia Patient is on prednisone as an outpatient. Started on hydrocortisone for stress dosing -Continue hydrocortisone but increase to 100 mg TID for now especially with soft BPs -Restart prednisone as able  Acute blood loss anemia Obtained LDH which was normal. Unlikely hemolysis and is all post-surgery. 1 unit of PRBC ordered. -Continue blood transfusion (irradiated blood) -Post-transfusion H&H  Chronic bronchitis Patient on Spiriva and albuterol as an outpatient  Restless leg syndrome On Mirapex as an outpatient  Mood disorder On Cymbalta  Chronic pain -Continue buprenorphine  GERD On Nexium   DVT prophylaxis:  SCDs Code Status:   Code Status: Full Code Family Communication: Daughter on telephone Disposition Plan: Discharge pending orthopedic surgery recommendations   Consultants:   Orthopedic surgery  Procedures:   None  Antimicrobials:  None    Subjective: Leg pain improved. No other conerns  Objective: Vitals:   03/08/19 0800 03/08/19 0815 03/08/19 0830 03/08/19 0901  BP: (!) 82/47 (!) 75/61 (!) 86/59 (!) 115/99  Pulse: 100 95 (!) 106 100  Resp:      Temp: 97.9 F (36.6 C) 97.9 F (36.6 C) 97.9 F (36.6 C) 97.9 F (36.6 C)  TempSrc: Oral Oral Oral Oral  SpO2: 97% 97% 97% 97%  Weight:        Intake/Output Summary (Last 24 hours) at 03/08/2019 0924 Last data filed at 03/08/2019 0237 Gross per 24 hour  Intake 2300 ml  Output 1170 ml  Net 1130 ml   Filed Weights   03/07/19 0657 03/08/19 0501  Weight: 92 kg 98.7 kg    Examination:  General exam: Appears calm and comfortable Respiratory system: Clear to auscultation. Respiratory effort normal. Cardiovascular system: S1 & S2 heard, RRR. 2/6 systolic murmur. Gastrointestinal system: Abdomen is nondistended, soft and nontender. No organomegaly or masses felt. Normal bowel sounds heard. Central nervous system: Alert and oriented. No focal neurological deficits. Extremities: No edema. No calf tenderness Skin: No cyanosis. No rashes. No ecchymosis around incision dressings Psychiatry: Judgement and insight appear normal. Mood & affect appropriate.      Data Reviewed: I have personally reviewed following labs and imaging studies  CBC: Recent Labs  Lab 03/06/19 1900 03/07/19 1809 03/07/19 2201 03/08/19 0323  WBC 14.6*  --  13.4* 13.7*  NEUTROABS 11.4*  --   --   --   HGB 12.5 9.5* 8.4* 7.5*  HCT 40.2 28.0* 26.4* 24.2*  MCV 100.0  --  99.2 100.0  PLT 299  --  187 [id number]   Basic Metabolic Panel: Recent Labs  Lab 03/06/19 1900 03/07/19 1809 03/07/19 2201 03/08/19 0323  NA 138 137  --  140  K 3.9 4.0  --  4.2   CL 97* 99  --  104  CO2 30  --   --  29  GLUCOSE 110* 156*  --  153*  BUN 17 17  --  17  CREATININE 0.86 0.70 0.94 0.84  CALCIUM 8.7*  --   --  7.7*   GFR: CrCl cannot be calculated (Unknown ideal weight.). Liver Function Tests: No results for input(s): AST, ALT, ALKPHOS, BILITOT, PROT, ALBUMIN in the last 168 hours. No results for input(s): LIPASE, AMYLASE in the last 168 hours. No results for input(s): AMMONIA in the last 168 hours. Coagulation Profile: No results for input(s): INR, PROTIME in the last 168 hours. Cardiac Enzymes: No results for input(s): CKTOTAL, CKMB, CKMBINDEX, TROPONINI in the last 168 hours. BNP (last 3 results) No results for input(s): PROBNP in the last 8760 hours. HbA1C: No results for input(s): HGBA1C in the last 72 hours. CBG: Recent Labs  Lab 03/07/19 1122 03/08/19 0244  GLUCAP 90 137*   Lipid Profile: No results for input(s): CHOL, HDL, LDLCALC, TRIG, CHOLHDL, LDLDIRECT in the last 72 hours. Thyroid Function Tests: No results for input(s): TSH, T4TOTAL, FREET4, T3FREE, THYROIDAB in the last 72 hours. Anemia Panel: No results for input(s): VITAMINB12, FOLATE, FERRITIN, TIBC, IRON, RETICCTPCT in the last 72 hours. Sepsis Labs: Recent Labs  Lab 03/07/19 0945 03/07/19 2001 03/08/19 0814  LATICACIDVEN 1.0 1.4 1.4    Recent Results (from the past 240 hour(s))  SARS CORONAVIRUS 2 (TAT 6-24 HRS) Nasopharyngeal Nasopharyngeal Swab     Status: None   Collection Time: 03/06/19  9:57 PM   Specimen: Nasopharyngeal Swab  Result Value Ref Range Status   SARS Coronavirus 2 NEGATIVE NEGATIVE Final    Comment: (NOTE) SARS-CoV-2 target nucleic acids are NOT DETECTED. The SARS-CoV-2 RNA is generally detectable in upper and lower respiratory specimens during the acute phase of infection. Negative results do not preclude SARS-CoV-2 infection, do not rule out co-infections with other pathogens, and should not be used as the sole basis for treatment or  other patient management decisions. Negative results must be combined with clinical observations, patient history, and epidemiological information. The expected result is Negative. Fact Sheet for Patients: SugarRoll.be Fact Sheet for Healthcare Providers: https://www.woods-mathews.com/ This test is not yet approved or cleared by the [place] FDA and  has been authorized for detection and/or diagnosis of SARS-CoV-2 by FDA under an Emergency Use Authorization (EUA). This EUA will remain  in effect (meaning this test can be used) for the duration of the COVID-19 declaration under Section 56 4(b)(1) of the Act, 21 U.S.C. section 360bbb-3(b)(1), unless the authorization is terminated or revoked sooner. Performed at [facility], [place] [address] [place], [place] [address]          Radiology Studies: DG Pelvis Portable  Result Date: 03/07/2019 CLINICAL DATA:  Status post left femur surgery EXAM: PORTABLE PELVIS 1-2 VIEWS COMPARISON:  None. FINDINGS: Pelvic ring is intact. Postsurgical changes are noted in the left femur. Previously seen femoral shaft fracture is again noted. Vascular calcifications are seen. IMPRESSION: Status post ORIF of left femoral fracture. Electronically Signed   By: Inez Catalina M.D.   On: 03/07/2019 20:03  CT L-SPINE NO CHARGE  Result Date: 03/06/2019 CLINICAL DATA:  Unable to walk.  Low back pain. EXAM: CT LUMBAR SPINE WITHOUT CONTRAST TECHNIQUE: Multidetector CT imaging of the lumbar spine was performed without intravenous contrast administration. Multiplanar CT image reconstructions were also generated. COMPARISON:  None. FINDINGS: Segmentation: 5 lumbar type vertebral bodies. Alignment: Mild curvature convex to the left with the apex at L2-3. Vertebrae: Old minor superior endplate compression deformity on the left at L4. Old minor superior endplate compression deformity more on the right at L2. No evidence of  recent fracture. No focal bone lesion. Paraspinal and other soft tissues: Nonobstructing stone in the lower pole the right kidney. Aortic atherosclerosis. Disc levels: T12-L1: Minimal disc bulge.  No stenosis. L1-2: Disc degeneration with loss of disc height. Endplate osteophytes and mild bulging of the disc. No compressive stenosis. L2-3: Minimal disc bulge. Minimal facet hypertrophy. No compressive stenosis. L3-4: Mild disc bulge. Mild facet hypertrophy. Mild narrowing of the lateral recesses but no definite compressive stenosis. L4-5: Moderate disc bulge more prominent towards the left. Facet and ligamentous hypertrophy. Stenosis of both lateral recesses left more than right. Neural compression could occur at this level, particularly on the left. L5-S1: Distant left hemilaminectomy. Disc space narrowing. Endplate osteophytes and mild annular bulging. No central canal stenosis. Mild bony foraminal narrowing on the left. Sacroiliac joints are unremarkable considering age. IMPRESSION: No evidence of recent fracture. Old minor superior endplate depression on the left at L4. Old minor superior endplate depression at L2, more on the right. No advanced spinal stenosis. Multilevel degenerative disc disease and degenerative facet disease, typical for age. At L4-5, there is bilateral lateral recess narrowing left more than right. Neural compression could possibly occur at this level, particularly on the left. At L5-S1, there has been a distant left hemilaminectomy. There is mild bony foraminal stenosis on the left at this level, not definitely compressive. Electronically Signed   By: Nelson Chimes M.D.   On: 03/06/2019 19:36   DG Knee Complete 4 Views Left  Result Date: 03/06/2019 CLINICAL DATA:  Recent fall with left knee pain, initial encounter EXAM: LEFT KNEE - COMPLETE 4+ VIEW COMPARISON:  None. FINDINGS: Partial knee replacement is noted laterally. Changes of prior fibular fracture are noted. Degenerative changes  are noted in all 3 joint compartments. No joint effusion is seen. No acute fracture is seen. IMPRESSION: Chronic changes without acute abnormality. Electronically Signed   By: Inez Catalina M.D.   On: 03/06/2019 21:14   DG C-Arm 1-60 Min  Result Date: 03/07/2019 CLINICAL DATA:  Femur fracture EXAM: DG C-ARM 1-60 MIN CONTRAST:  None FLUOROSCOPY TIME:  Fluoroscopy Time:  3 minutes 40 seconds Number of Acquired Spot Images: 13 COMPARISON:  03/06/2019 FINDINGS: Thirteen low resolution intraoperative spot views of the left femur. Displaced proximal femoral shaft fracture. Evidence of prior hemiarthroplasty at the knee. Subsequent images demonstrate multiple plate and screw fixation of the proximal femoral fracture. Insertion of intramedullary rod and distal fixating screws with anatomic alignment. Fixating screw placed across the left femoral neck and head. IMPRESSION: Intraoperative fluoroscopic assistance provided during surgical fixation of proximal left femur fracture Electronically Signed   By: Donavan Foil M.D.   On: 03/07/2019 19:03   CT Renal Stone Study  Addendum Date: 03/06/2019   ADDENDUM REPORT: 03/06/2019 20:07 ADDENDUM: Upon further evaluation, an acute displaced fracture deformity of the proximal left femur is seen. This involves the proximal left femoral shaft, just distal to the inter trochanteric region. Approximately  1/2 shaft width medial displacement of the distal fracture site is noted. Electronically Signed   By: Virgina Norfolk M.D.   On: 03/06/2019 20:07   Result Date: 03/06/2019 CLINICAL DATA:  Flank pain. EXAM: CT ABDOMEN AND PELVIS WITHOUT CONTRAST TECHNIQUE: Multidetector CT imaging of the abdomen and pelvis was performed following the standard protocol without IV contrast. COMPARISON:  None. FINDINGS: Lower chest: Mild atelectasis is seen within the bilateral lung bases. Marked severity coronary artery calcification is seen. Hepatobiliary: Numerous punctate calcified granulomas are  seen scattered throughout the liver. The gallbladder is not identified. Pancreas: Unremarkable. No pancreatic ductal dilatation or surrounding inflammatory changes. Spleen: The spleen is absent. Adrenals/Urinary Tract: Adrenal glands are unremarkable. Kidneys are normal, without focal lesions or hydronephrosis. A 7 mm nonobstructing renal stone is seen within the mid right kidney. Bladder is unremarkable. Stomach/Bowel: Stomach is within normal limits. The appendix is not identified. No evidence of bowel wall thickening, distention, or inflammatory changes. Noninflamed diverticula are seen throughout the sigmoid colon. Vascular/Lymphatic: Marked severity aortic atherosclerosis. No enlarged abdominal or pelvic lymph nodes. Reproductive: Status post hysterectomy. No adnexal masses. Other: A surgical scar is seen along the midline of the anterior abdominal wall Musculoskeletal: Multilevel degenerative changes seen throughout the lumbar spine. IMPRESSION: 1. 7 mm nonobstructing renal stone within the right kidney. 2. Absent spleen. 3. Sigmoid diverticulosis. Electronically Signed: By: Virgina Norfolk M.D. On: 03/06/2019 19:49   DG Hip Unilat W or Wo Pelvis 2-3 Views Left  Result Date: 03/06/2019 CLINICAL DATA:  Recent fall with left hip pain, initial encounter EXAM: DG HIP (WITH OR WITHOUT PELVIS) 2-3V LEFT COMPARISON:  None. FINDINGS: Pelvic ring is intact. There is a transverse fracture through the proximal femoral shaft approximately 2 cm below the intratrochanteric region. Femoral head is well seated. No soft tissue abnormality is noted. Posterior displacement of the distal fracture fragment is seen. IMPRESSION: Transverse fracture as described in the proximal femoral shaft. Electronically Signed   By: Inez Catalina M.D.   On: 03/06/2019 21:14   DG FEMUR MIN 2 VIEWS LEFT  Result Date: 03/07/2019 CLINICAL DATA:  Known left femoral fracture EXAM: LEFT FEMUR 2 VIEWS COMPARISON:  Films from the previous day  FLUOROSCOPY TIME:  Radiation Exposure Index (as provided by the fluoroscopic device): Not available If the device does not provide the exposure index: Fluoroscopy Time:  3 minutes 40 seconds Number of Acquired Images:  13 FINDINGS: Initial images again demonstrate the proximal left femoral shaft fracture. Fixation sideplate is noted in the proximal femur with multiple fixation screws. Medullary rod is in place as well. Proximal and distal fixation screws are seen. The fracture fragments are in near anatomic alignment. IMPRESSION: Status post ORIF of proximal left femoral fracture Electronically Signed   By: Inez Catalina M.D.   On: 03/07/2019 19:01   DG FEMUR PORT MIN 2 VIEWS LEFT  Result Date: 03/07/2019 CLINICAL DATA:  Status post ORIF of left femoral fracture EXAM: LEFT FEMUR PORTABLE 2 VIEWS COMPARISON:  03/06/2019 FINDINGS: Interval placement of medullary rod with proximal and distal fixation screws is seen. A lateral fixation sideplate along the proximal femur is seen. Prior partial knee replacement is noted. The fracture fragments are in near anatomic alignment. Patellofemoral degenerative changes are seen. IMPRESSION: Status post ORIF of left femoral fracture. Electronically Signed   By: Inez Catalina M.D.   On: 03/07/2019 20:04        Scheduled Meds: . sodium chloride   Intravenous Once  . [START  ON 03/11/2019] buprenorphine  1 patch Transdermal Weekly  . docusate sodium  100 mg Oral BID  . enoxaparin (LOVENOX) injection  40 mg Subcutaneous Q24H  . hydrocortisone sod succinate (SOLU-CORTEF) inj  100 mg Intravenous Q8H  . umeclidinium bromide  1 puff Inhalation Daily   Continuous Infusions: .  ceFAZolin (ANCEF) IV 1 g (03/08/19 0450)  . lactated ringers 10 mL/hr at 03/07/19 2129     LOS: 2 days     Cordelia Poche, MD [facility] 03/08/2019, 9:24 AM  If 7PM-7AM, please contact night-coverage [email or web address]

## 2019-03-08 NOTE — Progress Notes (Signed)
Pt on q15 VS re-measured 98/82, provider aware.

## 2019-03-08 NOTE — Progress Notes (Signed)
SPORTS MEDICINE AND JOINT REPLACEMENT  Lara Mulch, MD    Carlyon Shadow, PA-C [place], [place], [place]  [address]                             [phone]   PROGRESS NOTE  Subjective:  negative for Chest Pain  negative for Shortness of Breath  negative for Nausea/Vomiting   negative for Calf Pain  negative for Bowel Movement   Tolerating Diet: yes         Patient reports pain as 4 on 0-10 scale.    Objective: Vital signs in last 24 hours:    Patient Vitals for the past 24 hrs:  BP Temp Temp src Pulse Resp SpO2 Weight  03/08/19 0815 (!) 75/61 97.9 F (36.6 C) Oral 95 -- 97 % --  03/08/19 0800 (!) 82/47 97.9 F (36.6 C) Oral 100 -- 97 % --  03/08/19 0754 (!) 69/40 97.8 F (36.6 C) Oral 98 -- 97 % --  03/08/19 0746 (!) 63/49 97.8 F (36.6 C) Oral -- -- 97 % --  03/08/19 0625 (!) 67/39 -- -- (!) 101 -- -- --  03/08/19 0501 (!) 78/45 98.8 F (37.1 C) Oral (!) 103 18 95 % 98.7 kg  03/08/19 0449 (!) 80/45 -- -- -- -- -- --  03/08/19 0327 (!) 90/59 98.4 F (36.9 C) Oral (!) 103 (!) 22 94 % --  03/08/19 0227 (!) 90/37 -- -- (!) 106 (!) 21 98 % --  03/08/19 0000 (!) 71/38 -- -- 81 -- -- --  03/07/19 2300 (!) 86/46 -- -- (!) 106 -- -- --  03/07/19 2245 (!) 79/50 -- -- (!) 106 -- -- --  03/07/19 2230 (!) 92/47 -- -- (!) 106 -- -- --  03/07/19 2215 (!) 86/44 -- -- (!) 106 -- -- --  03/07/19 2200 (!) 88/56 -- -- (!) 107 -- 99 % --  03/07/19 2151 (!) 95/34 -- -- (!) 114 -- 94 % --  03/07/19 2150 (!) 95/34 -- -- (!) 114 -- 94 % --  03/07/19 2132 (!) 76/40 -- -- 95 -- 99 % --  03/07/19 2116 (!) 81/53 -- -- (!) 103 -- 98 % --  03/07/19 2032 (!) 122/94 99 F (37.2 C) Oral (!) 107 20 96 % --  03/07/19 2005 -- 98.4 F (36.9 C) -- (!) 114 (!) 22 97 % --  03/07/19 2004 -- -- -- (!) 111 (!) 21 96 % --  03/07/19 1954 92/62 -- -- (!) 113 (!) 22 99 % --  03/07/19 1945 -- -- -- 98 20 100 % --  03/07/19 1941 (!) 88/65 -- -- 96 (!) 21 100 % --  03/07/19 1939 (!) 72/63 -- --  97 (!) 21 100 % --  03/07/19 1924 108/72 -- -- (!) 102 (!) 22 100 % --  03/07/19 1915 -- -- -- 100 (!) 31 98 % --  03/07/19 1909 113/65 -- -- (!) 101 (!) 24 99 % --  03/07/19 1853 -- -- -- (!) 104 20 97 % --  03/07/19 1852 (!) 85/40 98 F (36.7 C) -- (!) 105 20 96 % --  03/07/19 1054 (!) 97/49 -- -- 86 -- 100 % --  03/07/19 1041 (!) 97/37 -- -- 85 -- 100 % --  03/07/19 0946 (!) 93/26 -- -- 80 -- -- --  03/07/19 0936 (!) 146/128 -- -- 78 -- -- --  03/07/19 0916 Marland Kitchen)  93/50 -- -- 46 -- -- --  03/07/19 0849 (!) 74/50 -- -- -- -- -- --    @flow {1959:LAST@   Intake/Output from previous day:   01/08 0701 - 01/09 0700 In: 2300 [I.V.:1800] Out: 1170 [Urine:670]   Intake/Output this shift:   No intake/output data recorded.   Intake/Output      01/08 0701 - 01/09 0700 01/09 0701 - 01/10 0700   P.O. 0    I.V. (mL/kg) 1800 (18.2)    IV Piggyback 500    Total Intake(mL/kg) 2300 (23.3)    Urine (mL/kg/hr) 670 (0.3)    Blood 500    Total Output 1170    Net +1130            LABORATORY DATA: Recent Labs    03/06/19 1900 03/07/19 1809 03/07/19 2201 03/08/19 0323  WBC 14.6*  --  13.4* 13.7*  HGB 12.5 9.5* 8.4* 7.5*  HCT 40.2 28.0* 26.4* 24.2*  PLT 299  --  187 186   Recent Labs    03/06/19 1900 03/07/19 1809 03/07/19 2201 03/08/19 0323  NA 138 137  --  140  K 3.9 4.0  --  4.2  CL 97* 99  --  104  CO2 30  --   --  29  BUN 17 17  --  17  CREATININE 0.86 0.70 0.94 0.84  GLUCOSE 110* 156*  --  153*  CALCIUM 8.7*  --   --  7.7*   No results found for: INR, PROTIME  Examination:  General appearance: alert, cooperative and no distress Extremities: extremities normal, atraumatic, no cyanosis or edema  Wound Exam: clean, dry, intact   Drainage:  None: wound tissue dry  Motor Exam: Quadriceps and Hamstrings Intact  Sensory Exam: Superficial Peroneal, Deep Peroneal and Tibial normal   Assessment:    1 Day Post-Op  Procedure(s) (LRB): INTRAMEDULLARY (IM) NAIL  INTERTROCHANTRIC (Left)  ADDITIONAL DIAGNOSIS:  Principal Problem:   Closed left hip fracture, initial encounter (HCC) Active Problems:   Diastolic CHF, chronic (HCC)   Autoimmune hemolytic anemia     Plan: Physical Therapy as ordered Weight Bearing as Tolerated (WBAT)  DVT Prophylaxis:  Lovenox  Patient resting comfortably in bed. S/p IMN  L femur. WBAT. PT/OT following. Medicine following. Will continue to follow from an orthopedic standpoint this weekend.    Donia Ast 03/08/2019, 8:48 AM

## 2019-03-08 NOTE — Evaluation (Signed)
Physical Therapy Evaluation Patient Details Name: Virginia Thompson MRN: [id number] DOB: [birth year] Today's Date: 03/08/2019   History of Present Illness  86 y.o. who sustained a stress atypical proximal left femur fracture now s/p ORIF IM nailing  Clinical Impression  Orders received for PT evaluation. Patient demonstrates deficits in functional mobility as indicated below. Will benefit from continued skilled PT to address deficits and maximize function. Will see as indicated and progress as tolerated.  Anticipate will need post acute rehabilitation upon discharge.    Follow Up Recommendations SNF    Equipment Recommendations       Recommendations for Other Services       Precautions / Restrictions Precautions Precautions: Fall Restrictions Weight Bearing Restrictions: Yes LLE Weight Bearing: Weight bearing as tolerated      Mobility  Bed Mobility Overal bed mobility: Needs Assistance Bed Mobility: Rolling;Supine to Sit Rolling: Max assist   Supine to sit: Max assist     General bed mobility comments: patient required max assist to initiate roll, difficulty comeing to side lying due to increased LLE pain, dizziness upon elevating upright in bed, max assist for positioning. labile BP with modest SOB. Repositioned using mechanical features of bed, will need +2 physical assist and improvements in BP and activity tolerance to progress mobility  Transfers                 General transfer comment: patient reluctant and unable to perform at this time  Ambulation/Gait             General Gait Details: NT  Stairs            Wheelchair Mobility    Modified Rankin (Stroke Patients Only)       Balance Overall balance assessment: Needs assistance;History of Falls                                           Pertinent Vitals/Pain Pain Assessment: Faces Faces Pain Scale: Hurts even more Pain Location: left hip and LE, back Pain  Descriptors / Indicators: Grimacing;Guarding;Operative site guarding Pain Intervention(s): Monitored during session;Repositioned;Ice applied    Home Living Family/patient expects to be discharged to:: Skilled nursing facility Living Arrangements: Children Available Help at Discharge: Family Type of Home: House Home Access: Ramped entrance     Home Layout: One level Home Equipment: Environmental consultant - 2 wheels;Walker - 4 wheels;Bedside commode;Wheelchair - manual      Prior Function Level of Independence: Needs assistance   Gait / Transfers Assistance Needed: uses RW for mobility           Hand Dominance   Dominant Hand: Right    Extremity/Trunk Assessment   Upper Extremity Assessment Upper Extremity Assessment: Generalized weakness    Lower Extremity Assessment Lower Extremity Assessment: RLE deficits/detail;LLE deficits/detail;Generalized weakness LLE: Unable to fully assess due to pain LLE Coordination: decreased fine motor;decreased gross motor       Communication   Communication: HOH  Cognition Arousal/Alertness: Awake/alert Behavior During Therapy: WFL for tasks assessed/performed Overall Cognitive Status: Within Functional Limits for tasks assessed                                        General Comments      Exercises Other Exercises Other Exercises: AROM bilateral LEs x10 (  limited tolerance LLE) Other Exercises: Knee flexion RLE x5 (unable to tolerate LLE) Other Exercises: educted on IC use and positioning   Assessment/Plan    PT Assessment Patient needs continued PT services  PT Problem List Decreased strength;Decreased range of motion;Decreased activity tolerance;Decreased balance;Decreased mobility;Decreased safety awareness;Pain;Obesity       PT Treatment Interventions DME instruction;Gait training;Functional mobility training;Therapeutic exercise;Therapeutic activities;Balance training;Patient/family education;Modalities    PT Goals  (Current goals can be found in the Care Plan section)  Acute Rehab PT Goals Patient Stated Goal: to not hurt PT Goal Formulation: With patient Time For Goal Achievement: 03/22/19 Potential to Achieve Goals: Fair    Frequency Min 3X/week   Barriers to discharge        Co-evaluation               AM-PAC PT "6 Clicks" Mobility  Outcome Measure Help needed turning from your back to your side while in a flat bed without using bedrails?: A Lot Help needed moving from lying on your back to sitting on the side of a flat bed without using bedrails?: A Lot Help needed moving to and from a bed to a chair (including a wheelchair)?: A Lot Help needed standing up from a chair using your arms (e.g., wheelchair or bedside chair)?: A Lot Help needed to walk in hospital room?: Total Help needed climbing 3-5 steps with a railing? : Total 6 Click Score: 10    End of Session Equipment Utilized During Treatment: Oxygen Activity Tolerance: Patient limited by fatigue;Patient limited by pain Patient left: in bed;with call bell/phone within reach Nurse Communication: Mobility status PT Visit Diagnosis: Muscle weakness (generalized) (M62.81);Repeated falls (R29.6);Unsteadiness on feet (R26.81);Pain Pain - Right/Left: Left Pain - part of body: Leg    Time: [id number] PT Time Calculation (min) (ACUTE ONLY): 27 min   Charges:   PT Evaluation $PT Eval Moderate Complexity: 1 Mod PT Treatments $Therapeutic Activity: 8-22 mins        Alben Deeds, PT DPT  Board Certified Neurologic Specialist Acute Rehabilitation Services Office [place] 03/08/2019, 9:56 AM

## 2019-03-08 NOTE — Progress Notes (Signed)
Confirmed pt yellow MEWS score due to respirations, SBP, and HR. None of these are acute changes. However, provider was contacted anyway. Patient receiving normal saline bolus to improve blood pressure.

## 2019-03-09 LAB — CBC
HCT: 24 % — ABNORMAL LOW (ref 36.0–46.0)
HCT: 24.8 % — ABNORMAL LOW (ref 36.0–46.0)
Hemoglobin: 8 g/dL — ABNORMAL LOW (ref 12.0–15.0)
Hemoglobin: 8.1 g/dL — ABNORMAL LOW (ref 12.0–15.0)
MCH: 31.9 pg (ref 26.0–34.0)
MCH: 31.9 pg (ref 26.0–34.0)
MCHC: 33.3 g/dL (ref 30.0–36.0)
MCHC: 32.7 g/dL (ref 30.0–36.0)
MCV: 95.6 fL (ref 80.0–100.0)
MCV: 97.6 fL (ref 80.0–100.0)
Platelets: 65 10*3/uL — ABNORMAL LOW (ref 150–400)
Platelets: 186 10*3/uL (ref 150–400)
RBC: 2.51 MIL/uL — ABNORMAL LOW (ref 3.87–5.11)
RBC: 2.54 MIL/uL — ABNORMAL LOW (ref 3.87–5.11)
RDW: 14.3 % (ref 11.5–15.5)
RDW: 14.6 % (ref 11.5–15.5)
WBC: 12.3 10*3/uL — ABNORMAL HIGH (ref 4.0–10.5)
WBC: 17.9 10*3/uL — ABNORMAL HIGH (ref 4.0–10.5)
nRBC: 0.2 % (ref 0.0–0.2)
nRBC: 0.2 % (ref 0.0–0.2)

## 2019-03-09 LAB — BASIC METABOLIC PANEL
Anion gap: 6 (ref 5–15)
BUN: 15 mg/dL (ref 8–23)
CO2: 29 mmol/L (ref 22–32)
Calcium: 7.9 mg/dL — ABNORMAL LOW (ref 8.9–10.3)
Chloride: 104 mmol/L (ref 98–111)
Creatinine, Ser: 0.78 mg/dL (ref 0.44–1.00)
GFR calc Af Amer: >60 mL/min (ref >60)
GFR calc non Af Amer: >60 mL/min (ref >60)
Glucose, Bld: 147 mg/dL — ABNORMAL HIGH (ref 70–99)
Potassium: 4 mmol/L (ref 3.5–5.1)
Sodium: 139 mmol/L (ref 135–145)

## 2019-03-09 LAB — GLUCOSE, CAPILLARY
Glucose-Capillary: 114 mg/dL — ABNORMAL HIGH (ref 70–99)
Glucose-Capillary: 130 mg/dL — ABNORMAL HIGH (ref 70–99)
Glucose-Capillary: 136 mg/dL — ABNORMAL HIGH (ref 70–99)
Glucose-Capillary: 147 mg/dL — ABNORMAL HIGH (ref 70–99)

## 2019-03-09 LAB — HEPATIC FUNCTION PANEL
ALT: 19 U/L (ref 0–44)
AST: 44 U/L — ABNORMAL HIGH (ref 15–41)
Albumin: 2.6 g/dL — ABNORMAL LOW (ref 3.5–5.0)
Alkaline Phosphatase: 39 U/L (ref 38–126)
Bilirubin, Direct: 0.3 mg/dL — ABNORMAL HIGH (ref 0.0–0.2)
Indirect Bilirubin: 0.8 mg/dL (ref 0.3–0.9)
Total Bilirubin: 1.1 mg/dL (ref 0.3–1.2)
Total Protein: 5.1 g/dL — ABNORMAL LOW (ref 6.5–8.1)

## 2019-03-09 LAB — LACTATE DEHYDROGENASE: LDH: 238 U/L — ABNORMAL HIGH (ref 98–192)

## 2019-03-09 LAB — HAPTOGLOBIN: Haptoglobin: 120 mg/dL (ref 41–333)

## 2019-03-09 MED ORDER — ENOXAPARIN SODIUM 40 MG/0.4ML ~~LOC~~ SOLN
40.0000 mg | SUBCUTANEOUS | Status: DC
  Administered 2019-03-09 – 2019-03-13 (×5): 40 mg via SUBCUTANEOUS
  Filled 2019-03-09 (×5): qty 0.4

## 2019-03-09 MED ORDER — PANTOPRAZOLE SODIUM 40 MG PO TBEC
40.0000 mg | DELAYED_RELEASE_TABLET | Freq: Every day | ORAL | Status: DC
  Administered 2019-03-09 – 2019-03-13 (×5): 40 mg via ORAL
  Filled 2019-03-09 (×5): qty 1

## 2019-03-09 MED ORDER — TRAMADOL HCL 50 MG PO TABS
50.0000 mg | ORAL_TABLET | Freq: Once | ORAL | Status: AC
  Administered 2019-03-09: 50 mg via ORAL
  Filled 2019-03-09: qty 1

## 2019-03-09 MED ORDER — PREDNISONE 5 MG PO TABS
5.0000 mg | ORAL_TABLET | Freq: Every day | ORAL | Status: DC
  Administered 2019-03-09 – 2019-03-10 (×2): 5 mg via ORAL
  Filled 2019-03-09 (×3): qty 1

## 2019-03-09 NOTE — Progress Notes (Signed)
RT placed patient on CPAP HS. 2L O2 bleed in needed. Patient tolerating well at this time. 

## 2019-03-09 NOTE — Progress Notes (Signed)
PROGRESS NOTE    Virginia Thompson  [id number] DOB: 1928/05/16 DOA: 03/06/2019 PCP: Angeline Slim, MD   Brief Narrative: Virginia Thompson is a 86 y.o. 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 Patient presented secondary to worsening left hip pain and found to have a left hip fracture.   Assessment & Plan:   Principal Problem:   Closed left hip fracture, initial encounter [facility]) Active Problems:   Diastolic CHF, chronic (HCC)   Autoimmune hemolytic anemia   Left hip fracture Does not appear to be secondary to trauma. Patient is on alendronate as an outpatient. Orthopedic surgery planning surgical management today. -Orthopedic surgery recommendations: Lovenox, PT, WBAT -Continue Morphine prn  Chronic diastolic heart failure Euvolemic at this time. Per IO, patient is up 1.9 L for this admission -Hold lasix and metoprolol  Hypotension Patient has received multiple boluses with minimal improvement. Lactic acid normal. Patient otherwise asymptomatic. Appears patient has chronic soft blood pressure with a history of hypotension in the past. Blood pressure improved today. -Continue midodrine  Chronic respiratory failure Patient is on 2L via nasal canula after a bad pneumonia s/p chemotherapy.  History of autoimmune hemolytic anemia Patient is on prednisone as an outpatient. Started on hydrocortisone for stress dosing. LDH trended up slightly. Hemoglobin stable after 1 unit of PRBC. -Restart prednisone today  Acute blood loss anemia Obtained LDH which was normal. Unlikely hemolysis and is all post-surgery. Patient is s/p 1 unit of PRBC on 1/9. Hemoglobin improved -Daily CBC  Chronic bronchitis Patient on Spiriva and albuterol as an outpatient  Restless leg syndrome On Mirapex as an outpatient  Mood disorder On Cymbalta  Chronic pain -Continue  buprenorphine  Sleep apnea -CPAP qhs  GERD On Nexium   DVT prophylaxis: SCDs, Lovenox Code Status:   Code Status: Full Code Family Communication: Daughter on telephone Disposition Plan: Discharge pending orthopedic surgery recommendations   Consultants:   Orthopedic surgery  Procedures:   None  Antimicrobials:  None    Subjective: Some dyspnea. Some pain of her left leg; rated 4/10. No other issues overnight.  Objective: Vitals:   03/09/19 0937 03/09/19 0947 03/09/19 0947 03/09/19 1000  BP: (!) 93/59 (!) 81/52 (!) 81/52 93/74  Pulse: (!) 106  (!) 104   Resp:      Temp: 98.3 F (36.8 C)  98.3 F (36.8 C)   TempSrc: Oral  Oral   SpO2: 96%  97%   Weight:        Intake/Output Summary (Last 24 hours) at 03/09/2019 1028 Last data filed at 03/09/2019 0900 Gross per 24 hour  Intake 1298 ml  Output 850 ml  Net 448 ml   Filed Weights   03/07/19 0657 03/08/19 0501 03/09/19 0500  Weight: 92 kg 98.7 kg 96.6 kg    Examination:  General exam: Appears calm and comfortable Respiratory system: Clear to auscultation. Respiratory effort normal. Cardiovascular system: S1 & S2 heard, RRR. No murmurs, rubs, gallops or clicks. Gastrointestinal system: Abdomen is nondistended, soft and nontender. No organomegaly or masses felt. Normal bowel sounds heard. Central nervous system: Alert and oriented. No focal neurological deficits. Extremities: No edema. No calf tenderness Skin: No cyanosis. Ecchymosis noted on right forearm. No bruising around dressing Psychiatry: Judgement and insight appear normal. Mood & affect appropriate.    Data Reviewed: I have personally reviewed following labs and imaging studies  CBC: Recent Labs  Lab 03/06/19 1900 03/07/19  2201 03/08/19 0323 03/08/19 0902 03/08/19 2331 03/09/19 0002 03/09/19 0739  WBC 14.6* 13.4* 13.7*  --   --  12.3* 17.9*  NEUTROABS 11.4*  --   --   --   --   --   --   HGB 12.5 8.4* 7.5* 7.3* 8.0* 8.0* 8.1*  HCT  40.2 26.4* 24.2* 23.8* 24.7* 24.0* 24.8*  MCV 100.0 99.2 100.0  --   --  95.6 97.6  PLT 299 187 186  --   --  65* [id number]   Basic Metabolic Panel: Recent Labs  Lab 03/06/19 1900 03/07/19 1809 03/07/19 2201 03/08/19 0323 03/09/19 0002  NA 138 137  --  140 139  K 3.9 4.0  --  4.2 4.0  CL 97* 99  --  104 104  CO2 30  --   --  29 29  GLUCOSE 110* 156*  --  153* 147*  BUN 17 17  --  17 15  CREATININE 0.86 0.70 0.94 0.84 0.78  CALCIUM 8.7*  --   --  7.7* 7.9*   GFR: CrCl cannot be calculated (Unknown ideal weight.). Liver Function Tests: Recent Labs  Lab 03/09/19 0739  AST 44*  ALT 19  ALKPHOS 39  BILITOT 1.1  PROT 5.1*  ALBUMIN 2.6*   No results for input(s): LIPASE, AMYLASE in the last 168 hours. No results for input(s): AMMONIA in the last 168 hours. Coagulation Profile: No results for input(s): INR, PROTIME in the last 168 hours. Cardiac Enzymes: No results for input(s): CKTOTAL, CKMB, CKMBINDEX, TROPONINI in the last 168 hours. BNP (last 3 results) No results for input(s): PROBNP in the last 8760 hours. HbA1C: No results for input(s): HGBA1C in the last 72 hours. CBG: Recent Labs  Lab 03/08/19 0244 03/08/19 1133 03/08/19 1724 03/09/19 0025 03/09/19 0803  GLUCAP 137* 128* 164* 130* 114*   Lipid Profile: No results for input(s): CHOL, HDL, LDLCALC, TRIG, CHOLHDL, LDLDIRECT in the last 72 hours. Thyroid Function Tests: No results for input(s): TSH, T4TOTAL, FREET4, T3FREE, THYROIDAB in the last 72 hours. Anemia Panel: Recent Labs    03/08/19 1130  FERRITIN 128  TIBC 175*  IRON 13*   Sepsis Labs: Recent Labs  Lab 03/07/19 0945 03/07/19 2001 03/08/19 0814  LATICACIDVEN 1.0 1.4 1.4    Recent Results (from the past 240 hour(s))  SARS CORONAVIRUS 2 (TAT 6-24 HRS) Nasopharyngeal Nasopharyngeal Swab     Status: None   Collection Time: 03/06/19  9:57 PM   Specimen: Nasopharyngeal Swab  Result Value Ref Range Status   SARS Coronavirus 2 NEGATIVE NEGATIVE  Final    Comment: (NOTE) SARS-CoV-2 target nucleic acids are NOT DETECTED. The SARS-CoV-2 RNA is generally detectable in upper and lower respiratory specimens during the acute phase of infection. Negative results do not preclude SARS-CoV-2 infection, do not rule out co-infections with other pathogens, and should not be used as the sole basis for treatment or other patient management decisions. Negative results must be combined with clinical observations, patient history, and epidemiological information. The expected result is Negative. Fact Sheet for Patients: SugarRoll.be Fact Sheet for Healthcare Providers: https://www.woods-mathews.com/ This test is not yet approved or cleared by the [place] FDA and  has been authorized for detection and/or diagnosis of SARS-CoV-2 by FDA under an Emergency Use Authorization (EUA). This EUA will remain  in effect (meaning this test can be used) for the duration of the COVID-19 declaration under Section 56 4(b)(1) of the Act, 21 U.S.C. section 360bbb-3(b)(1), unless the  authorization is terminated or revoked sooner. Performed at [facility], [place] [address], [place], [place] [address]          Radiology Studies: DG Pelvis Portable  Result Date: 03/07/2019 CLINICAL DATA:  Status post left femur surgery EXAM: PORTABLE PELVIS 1-2 VIEWS COMPARISON:  None. FINDINGS: Pelvic ring is intact. Postsurgical changes are noted in the left femur. Previously seen femoral shaft fracture is again noted. Vascular calcifications are seen. IMPRESSION: Status post ORIF of left femoral fracture. Electronically Signed   By: Inez Catalina M.D.   On: 03/07/2019 20:03   DG C-Arm 1-60 Min  Result Date: 03/07/2019 CLINICAL DATA:  Femur fracture EXAM: DG C-ARM 1-60 MIN CONTRAST:  None FLUOROSCOPY TIME:  Fluoroscopy Time:  3 minutes 40 seconds Number of Acquired Spot Images: 13 COMPARISON:  03/06/2019 FINDINGS: Thirteen low  resolution intraoperative spot views of the left femur. Displaced proximal femoral shaft fracture. Evidence of prior hemiarthroplasty at the knee. Subsequent images demonstrate multiple plate and screw fixation of the proximal femoral fracture. Insertion of intramedullary rod and distal fixating screws with anatomic alignment. Fixating screw placed across the left femoral neck and head. IMPRESSION: Intraoperative fluoroscopic assistance provided during surgical fixation of proximal left femur fracture Electronically Signed   By: Donavan Foil M.D.   On: 03/07/2019 19:03   ECHOCARDIOGRAM COMPLETE  Result Date: 03/08/2019   ECHOCARDIOGRAM REPORT   Patient Name:   Virginia Thompson Date of Exam: 03/08/2019 Medical Rec #:  [id number]      Height:       62.0 in Accession #:    [id number]     Weight:       217.6 lb Date of Birth:  05-Jan-1929     BSA:          1.98 m Patient Age:    38 years       BP:           68/57 mmHg Patient Gender: F              HR:           95 bpm. Exam Location:  Inpatient Procedure: 2D Echo Indications:    Aortic Stenosis 424.1/135.0  History:        Patient has prior history of Echocardiogram examinations, most                 recent 10/06/2004. CHF; Risk Factors:Hypertension.  Sonographer:    Clayton Lefort RDCS (AE) Referring Phys: [phone] Ayeisha Lindenberger A Bridgid Printz  Sonographer Comments: Limited patient mobility. IMPRESSIONS  1. Left ventricular ejection fraction, by visual estimation, is 60 to 65%. The left ventricle has normal function. There is moderately increased left ventricular hypertrophy.  2. Elevated left atrial pressure.  3. Global right ventricle has normal systolic function.The right ventricular size is normal.  4. Left atrial size was severely dilated.  5. Right atrial size was normal.  6. Moderate mitral annular calcification.  7. The mitral valve is normal in structure. No evidence of mitral valve regurgitation. No evidence of mitral stenosis.  8. The tricuspid valve is normal in structure.  9.  The aortic valve has an indeterminant number of cusps. Aortic valve regurgitation is not visualized. Moderate aortic valve stenosis. 10. The pulmonic valve was not well visualized. Pulmonic valve regurgitation is not visualized. 11. The left ventricle has no regional wall motion abnormalities. FINDINGS  Left Ventricle: Left ventricular ejection fraction, by visual estimation, is 60 to 65%. The left ventricle  has normal function. The left ventricle has no regional wall motion abnormalities. There is moderately increased left ventricular hypertrophy. Elevated left atrial pressure. Right Ventricle: The right ventricular size is normal.Global RV systolic function is has normal systolic function. Left Atrium: Left atrial size was severely dilated. Right Atrium: Right atrial size was normal in size Pericardium: There is no evidence of pericardial effusion. Mitral Valve: The mitral valve is normal in structure. Moderate mitral annular calcification. No evidence of mitral valve regurgitation. No evidence of mitral valve stenosis by observation. MV peak gradient, 19.2 mmHg. Tricuspid Valve: The tricuspid valve is normal in structure. Tricuspid valve regurgitation is trivial. Aortic Valve: The aortic valve has an indeterminant number of cusps. Aortic valve regurgitation is not visualized. Moderate aortic stenosis is present. Aortic valve mean gradient measures 20.0 mmHg. Aortic valve peak gradient measures 32.5 mmHg. Aortic valve area, by VTI measures 1.13 cm. Pulmonic Valve: The pulmonic valve was not well visualized. Pulmonic valve regurgitation is not visualized. Pulmonic regurgitation is not visualized. Aorta: The aortic root is normal in size and structure. Venous: The inferior vena cava was not well visualized. IAS/Shunts: No atrial level shunt detected by color flow Doppler. Additional Comments: Normal LV systolic function; moderate LVH; moderate AS (mean gradient 21 mmHg); mean gradient across MV elevated (11 mmHg)  but no MS by pressure half time; severe LAE.  LEFT VENTRICLE PLAX 2D LVIDd:         3.40 cm  Diastology LVIDs:         3.28 cm  LV e' lateral:   5.44 cm/s LV PW:         1.77 cm  LV E/e' lateral: 31.6 LV IVS:        1.72 cm  LV e' medial:    7.51 cm/s LVOT diam:     1.80 cm  LV E/e' medial:  22.9 LV SV:         4 ml LV SV Index:   1.85 LVOT Area:     2.54 cm  RIGHT VENTRICLE RV Basal diam:  2.79 cm RV S prime:     13.90 cm/s TAPSE (M-mode): 2.1 cm LEFT ATRIUM              Index       RIGHT ATRIUM           Index LA diam:        4.40 cm  2.22 cm/m  RA Area:     11.50 cm LA Vol (A2C):   109.0 ml 55.01 ml/m RA Volume:   20.80 ml  10.50 ml/m LA Vol (A4C):   81.6 ml  41.18 ml/m LA Biplane Vol: 94.7 ml  47.79 ml/m  AORTIC VALVE AV Area (Vmax):    1.26 cm AV Area (Vmean):   1.14 cm AV Area (VTI):     1.13 cm AV Vmax:           285.00 cm/s AV Vmean:          214.000 cm/s AV VTI:            0.607 m AV Peak Grad:      32.5 mmHg AV Mean Grad:      20.0 mmHg LVOT Vmax:         141.50 cm/s LVOT Vmean:        95.450 cm/s LVOT VTI:          0.270 m LVOT/AV VTI ratio: 0.45  AORTA Ao Root diam: 2.80 cm  MITRAL VALVE                         TRICUSPID VALVE MV Area (PHT): 3.19 cm              TR Peak grad:   15.8 mmHg MV Peak grad:  19.2 mmHg             TR Vmax:        266.00 cm/s MV Mean grad:  11.0 mmHg MV Vmax:       2.19 m/s              SHUNTS MV Vmean:      154.0 cm/s            Systemic VTI:  0.27 m MV VTI:        0.43 m                Systemic Diam: 1.80 cm MV PHT:        69.02 msec MV Decel Time: 238 msec MV E velocity: 172.00 cm/s 103 cm/s MV A velocity: 185.00 cm/s 70.3 cm/s MV E/A ratio:  0.93        1.5  Kirk Ruths MD Electronically signed by Kirk Ruths MD Signature Date/Time: 03/08/2019/2:43:58 PM    Final    DG FEMUR MIN 2 VIEWS LEFT  Result Date: 03/07/2019 CLINICAL DATA:  Known left femoral fracture EXAM: LEFT FEMUR 2 VIEWS COMPARISON:  Films from the previous day FLUOROSCOPY TIME:  Radiation  Exposure Index (as provided by the fluoroscopic device): Not available If the device does not provide the exposure index: Fluoroscopy Time:  3 minutes 40 seconds Number of Acquired Images:  13 FINDINGS: Initial images again demonstrate the proximal left femoral shaft fracture. Fixation sideplate is noted in the proximal femur with multiple fixation screws. Medullary rod is in place as well. Proximal and distal fixation screws are seen. The fracture fragments are in near anatomic alignment. IMPRESSION: Status post ORIF of proximal left femoral fracture Electronically Signed   By: Inez Catalina M.D.   On: 03/07/2019 19:01   DG FEMUR PORT MIN 2 VIEWS LEFT  Result Date: 03/07/2019 CLINICAL DATA:  Status post ORIF of left femoral fracture EXAM: LEFT FEMUR PORTABLE 2 VIEWS COMPARISON:  03/06/2019 FINDINGS: Interval placement of medullary rod with proximal and distal fixation screws is seen. A lateral fixation sideplate along the proximal femur is seen. Prior partial knee replacement is noted. The fracture fragments are in near anatomic alignment. Patellofemoral degenerative changes are seen. IMPRESSION: Status post ORIF of left femoral fracture. Electronically Signed   By: Inez Catalina M.D.   On: 03/07/2019 20:04        Scheduled Meds: . [START ON 03/11/2019] buprenorphine  1 patch Transdermal Weekly  . docusate sodium  100 mg Oral BID  . enoxaparin (LOVENOX) injection  40 mg Subcutaneous Q24H  . hydrocortisone sod succinate (SOLU-CORTEF) inj  100 mg Intravenous Q8H  . midodrine  5 mg Oral TID WC  . pantoprazole  40 mg Oral Daily  . umeclidinium bromide  1 puff Inhalation Daily   Continuous Infusions: . lactated ringers 10 mL/hr at 03/07/19 2129     LOS: 3 days     Cordelia Poche, MD [facility] 03/09/2019, 10:28 AM  If 7PM-7AM, please contact night-coverage [email or web address]

## 2019-03-09 NOTE — NC FL2 (Signed)
[place] LEVEL OF CARE SCREENING TOOL     IDENTIFICATION  Patient Name: Virginia Thompson Birthdate: 1928/06/20 Sex: female Admission Date (Current Location): 03/06/2019  [facility] and [place] Number:  Herbalist and Address:  [facility] [place], [place] [address] [place], [place] [address]      Provider Number: [id number]  Attending Physician Name and Address:  Mariel Aloe, MD  Relative Name and Phone Number:  [phone]    Current Level of Care: Hospital Recommended Level of Care: [place] Prior Approval Number:    Date Approved/Denied:   PASRR Number: [id number]  Discharge Plan: SNF    Current Diagnoses: Patient Active Problem List   Diagnosis Date Noted  . Diastolic CHF, chronic ([place]) 03/07/2019  . 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 Closed left hip fracture, initial encounter ([place]) 03/06/2019    Orientation RESPIRATION BLADDER Height & Weight     Self, Time, Situation, Place  O2, Other (Comment)(2L, C-PAP) External catheter, Incontinent Weight: 212 lb 15.4 oz (96.6 kg) Height:     BEHAVIORAL SYMPTOMS/MOOD NEUROLOGICAL BOWEL NUTRITION STATUS      Continent (see discharge summary)  AMBULATORY STATUS COMMUNICATION OF NEEDS Skin   Extensive Assist Verbally Surgical wounds                       Personal Care Assistance Level of Assistance  Bathing, Feeding, Dressing Bathing Assistance: Limited assistance Feeding assistance: Independent Dressing Assistance: Limited assistance Total Care Assistance: Maximum assistance   Functional Limitations Info  Sight, Hearing, Speech Sight Info: Impaired Hearing Info: Impaired Speech Info: Adequate    SPECIAL CARE FACTORS FREQUENCY  PT (By licensed PT), OT (By licensed OT)     PT Frequency: 5 times per week OT Frequency: 5 times per week            Contractures Contractures Info: Not present    Additional Factors Info  Code Status,  Allergies Code Status Info: Full Allergies Info: ALEVE , Tape,Mobic           Current Medications (03/09/2019):  This is the current hospital active medication list Current Facility-Administered Medications  Medication Dose Route Frequency Provider Last Rate Last Admin  . acetaminophen (TYLENOL) tablet 325-650 mg  325-650 mg Oral Q6H PRN Ainsley Spinner, PA-C   650 mg at 03/08/19 2127  . albuterol (PROVENTIL) (2.5 MG/3ML) 0.083% nebulizer solution 2.5 mg  2.5 mg Nebulization Q6H PRN Ainsley Spinner, PA-C      . [START ON 03/11/2019] buprenorphine (BUTRANS) 5 MCG/HR 1 patch  1 patch Transdermal Weekly Ainsley Spinner, PA-C      . calcium carbonate (TUMS - dosed in mg elemental calcium) chewable tablet 200 mg of elemental calcium  1 tablet Oral TID WC PRN Mariel Aloe, MD   200 mg of elemental calcium at 03/08/19 1828  . docusate sodium (COLACE) capsule 100 mg  100 mg Oral BID Ainsley Spinner, PA-C   100 mg at 03/08/19 2127  . HYDROcodone-acetaminophen (NORCO) 7.5-325 MG per tablet 1-2 tablet  1-2 tablet Oral Q4H PRN Ainsley Spinner, PA-C   2 tablet at 03/08/19 [id number]  . HYDROcodone-acetaminophen (NORCO/VICODIN) 5-325 MG per tablet 1-2 tablet  1-2 tablet Oral Q4H PRN Ainsley Spinner, PA-C      . hydrocortisone sodium succinate (SOLU-CORTEF) 100 MG injection 100 mg  100 mg Intravenous Q8H Mariel Aloe, MD   100 mg at 03/09/19 0100  . lactated  ringers infusion   Intravenous Continuous Ainsley Spinner, PA-C 10 mL/hr at 03/07/19 2129 New Bag at 03/07/19 2129  . metoCLOPramide (REGLAN) tablet 5-10 mg  5-10 mg Oral Q8H PRN Ainsley Spinner, PA-C       Or  . metoCLOPramide (REGLAN) injection 5-10 mg  5-10 mg Intravenous Q8H PRN Ainsley Spinner, PA-C      . midodrine (PROAMATINE) tablet 5 mg  5 mg Oral TID WC Mariel Aloe, MD   5 mg at 03/09/19 [id number]  . morphine 2 MG/ML injection 0.5 mg  0.5 mg Intravenous Q2H PRN Ainsley Spinner, PA-C   0.5 mg at 03/07/19 [id number]  . morphine 2 MG/ML injection 0.5-1 mg  0.5-1 mg Intravenous Q2H PRN Ainsley Spinner, PA-C      . ondansetron [facility]) tablet 4 mg  4 mg Oral Q6H PRN Ainsley Spinner, PA-C       Or  . ondansetron [facility]) injection 4 mg  4 mg Intravenous Q6H PRN Ainsley Spinner, PA-C      . umeclidinium bromide (INCRUSE ELLIPTA) 62.5 MCG/INH 1 puff  1 puff Inhalation Daily Ainsley Spinner, PA-C   1 puff at 03/09/19 [id number]     Discharge Medications: Please see discharge summary for a list of discharge medications.  Relevant Imaging Results:  Relevant Lab Results:   Additional Information ss# [place], LCSW

## 2019-03-09 NOTE — TOC Initial Note (Signed)
Transition of Care [facility]) - Initial/Assessment Note    Patient Details  Name: Virginia Thompson MRN: [id number] Date of Birth: 05-03-1928  Transition of Care [facility]) CM/SW Contact:    Bary Castilla, LCSW Phone Number: [phone] 03/09/2019, 8:05 AM  Clinical Narrative:                  CSW met with patient and patient's daughter Virginia Thompson to discuss PT recommendation of a SNF. They were aware of recommendation and in agreement with going to a ST SNF. CSW discussed the SNF process.CSW provided them with medicare.gov rating list.  Patient gave CSW permission to fax referrals out to facilities local and in [place]. [facility] preference [facility] since [facility] has been there before. Patient does not want to go to [facility]..CSW answered questions about the SNF process and the next steps in the process.  They informed CSW that Virginia Thompson would assist patient with choosing a facility.     Expected Discharge Plan: Skilled Nursing Facility Barriers to Discharge: Continued Medical Work up, SNF Pending bed offer   Patient Goals and CMS Choice Patient states their goals for this hospitalization and ongoing recovery are:: To be able to walk better CMS Medicare.gov Compare Post Acute Care list provided to:: Patient    Expected Discharge Plan and Services Expected Discharge Plan: [place]                                              Prior Living Arrangements/Services   Lives with:: Self, Adult Children Patient language and need for interpreter reviewed:: Yes                 Activities of Daily Living      Permission Sought/Granted      Share Information with NAME: Virginia Thompson  Permission granted to share info w AGENCY: SNFs  Permission granted to share info w Relationship: Daughter  Permission granted to share info w Contact Information: [phone]  Emotional Assessment Appearance:: Appears stated age Attitude/Demeanor/Rapport: Gracious Affect (typically  observed): Accepting, Adaptable Orientation: : Oriented to Self, Oriented to Place, Oriented to  Time, Oriented to Situation      Admission diagnosis:  Low back pain [M54.5] Closed left hip fracture, initial encounter [facility]) [S72.002A] Patient Active Problem List   Diagnosis Date Noted  . Diastolic CHF, chronic ([place]) 03/07/2019  . 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 Closed left hip fracture, initial encounter ([place]) 03/06/2019   PCP:  Angeline Slim, MD Pharmacy:   [facility], [place] [address] Phone: [phone] Fax: [phone]     Social Determinants of Health (SDOH) Interventions    Readmission Risk Interventions No flowsheet data found.

## 2019-03-09 NOTE — Progress Notes (Signed)
No acute change. Provider Dr. Georgena Spurling, MD aware.

## 2019-03-09 NOTE — Evaluation (Signed)
Occupational Therapy Evaluation Patient Details Name: Virginia Thompson MRN: [id number] DOB: 1928-11-14 Today's Date: 03/09/2019    History of Present Illness 86 yo female sustained L femur fx s/p ORIF IM nailing on 1/8 PMH CHF autoimmune hemolytic anemia mood disorder chronic pain chronic bronchitis   Clinical Impression   PTA pt living with dtr, independent with most BADL and functional mobility (getting increasingly more difficult) and needing assist with IADL. Pt uses RW at baseline and wears 2L O2 chronically. At time of eval, she is able to complete bed mobility at max A +2. Stedy then used for transfer, which did increase pain at LLE with flexion but pt was ultimately able to tolerate. Pt initiated stand with max A +2 with use of bed pad to clear hips. She was able to maintain standing in the stedy without resting on flaps. She was very eager for OOB to the chair. Given her current status and increased difficulty to complete safe transfers and LB ADL, recommend SNF at d/c. Dtr present for recommendations and education. Will continue to follow per POC listed below.  Orthostatics/Vitals: BP is as follows: 103/82 supine; 86/74 sitting EOB (asymptomatic); 96/65 after up in chair HR up to 122 when up in chair O2 remained stable    Follow Up Recommendations  SNF;Supervision/Assistance - 24 hour    Equipment Recommendations  None recommended by OT    Recommendations for Other Services       Precautions / Restrictions Precautions Precautions: Fall Restrictions Weight Bearing Restrictions: Yes LLE Weight Bearing: Weight bearing as tolerated      Mobility Bed Mobility Overal bed mobility: Needs Assistance Bed Mobility: Supine to Sit     Supine to sit: Max assist;+2 for physical assistance;+2 for safety/equipment     General bed mobility comments: max A +2 for BLE translation (L>R), with truncal support anteriorly and posteriorly. Pt without c/o dizziness but did have orthostasis  present with BP readings  Transfers Overall transfer level: Needs assistance Equipment used: Ambulation equipment used Transfers: Sit to/from Stand Sit to Stand: Max assist;+2 physical assistance;+2 safety/equipment         General transfer comment: max A+2 to rise and steady with use of bed pads. Stedy used due to gen weakness, pt having some difficulty tolerating bend in L knee with stedy but ultimately able to tolerate for transfer    Balance Overall balance assessment: Needs assistance;History of Falls Sitting-balance support: Bilateral upper extremity supported;Feet supported Sitting balance-Leahy Scale: Poor Sitting balance - Comments: fluctuating between min A-min guard. offloading due to pain   Standing balance support: During functional activity;Bilateral upper extremity supported Standing balance-Leahy Scale: Zero                             ADL either performed or assessed with clinical judgement   ADL Overall ADL's : Needs assistance/impaired Eating/Feeding: Set up;Sitting   Grooming: Set up;Sitting   Upper Body Bathing: Moderate assistance;Sitting   Lower Body Bathing: Total assistance;Sitting/lateral leans;Sit to/from stand   Upper Body Dressing : Sitting;Minimal assistance   Lower Body Dressing: Total assistance;Sitting/lateral leans;Sit to/from stand   Toilet Transfer: Maximal assistance;+2 for physical assistance;+2 for safety/equipment Toilet Transfer Details (indicate cue type and reason): stedy used for transfer this date Toileting- Water quality scientist and Hygiene: Maximal assistance;Total assistance       Functional mobility during ADLs: Maximal assistance;+2 for physical assistance;+2 for safety/equipment(use of stedy only) General ADL Comments: pt limited by  post operative pain, generalized weakness, and decreased activity tolerance for BADL     Vision Baseline Vision/History: No visual deficits Patient Visual Report: No change  from baseline       Perception     Praxis      Pertinent Vitals/Pain Pain Assessment: Faces Faces Pain Scale: Hurts even more Pain Location: operative site Pain Descriptors / Indicators: Grimacing;Guarding;Operative site guarding Pain Intervention(s): Limited activity within patient's tolerance;Monitored during session;Repositioned     Hand Dominance     Extremity/Trunk Assessment Upper Extremity Assessment Upper Extremity Assessment: Generalized weakness   Lower Extremity Assessment Lower Extremity Assessment: Defer to PT evaluation       Communication Communication Communication: No difficulties   Cognition Arousal/Alertness: Awake/alert Behavior During Therapy: WFL for tasks assessed/performed Overall Cognitive Status: Within Functional Limits for tasks assessed                                     General Comments       Exercises     Shoulder Instructions      Home Living Family/patient expects to be discharged to:: Skilled nursing facility Living Arrangements: Children Available Help at Discharge: Family Type of Home: House Home Access: Ramped entrance     Home Layout: One level               Home Equipment: Environmental consultant - 2 wheels;Walker - 4 wheels;Bedside commode;Wheelchair - manual          Prior Functioning/Environment Level of Independence: Needs assistance  Gait / Transfers Assistance Needed: uses RW for mobility at baseline ADL's / Homemaking Assistance Needed: assist from dtr with IADL, bathing getting more difficult            OT Problem List: Decreased knowledge of use of DME or AE;Decreased range of motion;Decreased knowledge of precautions;Decreased activity tolerance;Cardiopulmonary status limiting activity;Impaired balance (sitting and/or standing);Pain;Decreased strength      OT Treatment/Interventions: Self-care/ADL training;Therapeutic exercise;Patient/family education;Balance training;Therapeutic  activities;Energy conservation;DME and/or AE instruction    OT Goals(Current goals can be found in the care plan section) Acute Rehab OT Goals Patient Stated Goal: go home soon OT Goal Formulation: With patient Time For Goal Achievement: 03/23/19 Potential to Achieve Goals: Good  OT Frequency: Min 2X/week   Barriers to D/C:            Co-evaluation              AM-PAC OT "6 Clicks" Daily Activity     Outcome Measure Help from another person eating meals?: A Little Help from another person taking care of personal grooming?: A Little Help from another person toileting, which includes using toliet, bedpan, or urinal?: A Lot Help from another person bathing (including washing, rinsing, drying)?: A Lot Help from another person to put on and taking off regular upper body clothing?: A Little Help from another person to put on and taking off regular lower body clothing?: Total 6 Click Score: 14   End of Session Equipment Utilized During Treatment: Gait belt;Oxygen;Other (comment)(stedy) Nurse Communication: Mobility status;Need for lift equipment;Precautions;Weight bearing status  Activity Tolerance: Patient tolerated treatment well Patient left: in chair;with call bell/phone within reach;with chair alarm set;with family/visitor present  OT Visit Diagnosis: Unsteadiness on feet (R26.81);Other abnormalities of gait and mobility (R26.89);Muscle weakness (generalized) (M62.81);History of falling (Z91.81);Pain Pain - Right/Left: Left Pain - part of body: Hip;Knee;Leg  Time: [id number] OT Time Calculation (min): 41 min Charges:  OT General Charges $OT Visit: 1 Visit OT Evaluation $OT Eval Moderate Complexity: 1 Mod OT Treatments $Self Care/Home Management : 23-37 mins . Zenovia Jarred, MSOT, OTR/L Acute Rehabilitation Services [facility] Office Number: [phone]  Zenovia Jarred 03/09/2019, 1:11 PM

## 2019-03-09 NOTE — Progress Notes (Signed)
Pt wears C-pap at night at home, please address, Thanks Arvella Nigh RN.

## 2019-03-10 ENCOUNTER — Inpatient Hospital Stay (HOSPITAL_COMMUNITY): Payer: Medicare Other

## 2019-03-10 LAB — BASIC METABOLIC PANEL
Anion gap: 8 (ref 5–15)
BUN: 15 mg/dL (ref 8–23)
CO2: 30 mmol/L (ref 22–32)
Calcium: 8.3 mg/dL — ABNORMAL LOW (ref 8.9–10.3)
Chloride: 104 mmol/L (ref 98–111)
Creatinine, Ser: 0.75 mg/dL (ref 0.44–1.00)
GFR calc Af Amer: >60 mL/min (ref >60)
GFR calc non Af Amer: >60 mL/min (ref >60)
Glucose, Bld: 108 mg/dL — ABNORMAL HIGH (ref 70–99)
Potassium: 3.6 mmol/L (ref 3.5–5.1)
Sodium: 142 mmol/L (ref 135–145)

## 2019-03-10 LAB — GLUCOSE, CAPILLARY
Glucose-Capillary: 94 mg/dL (ref 70–99)
Glucose-Capillary: 74 mg/dL (ref 70–99)
Glucose-Capillary: 148 mg/dL — ABNORMAL HIGH (ref 70–99)
Glucose-Capillary: 140 mg/dL — ABNORMAL HIGH (ref 70–99)

## 2019-03-10 LAB — CBC
HCT: 27.1 % — ABNORMAL LOW (ref 36.0–46.0)
HCT: 25.7 % — ABNORMAL LOW (ref 36.0–46.0)
Hemoglobin: 8.7 g/dL — ABNORMAL LOW (ref 12.0–15.0)
Hemoglobin: 7.9 g/dL — ABNORMAL LOW (ref 12.0–15.0)
MCH: 31.4 pg (ref 26.0–34.0)
MCH: 30.6 pg (ref 26.0–34.0)
MCHC: 32.1 g/dL (ref 30.0–36.0)
MCHC: 30.7 g/dL (ref 30.0–36.0)
MCV: 97.8 fL (ref 80.0–100.0)
MCV: 99.6 fL (ref 80.0–100.0)
Platelets: 229 10*3/uL (ref 150–400)
Platelets: 244 10*3/uL (ref 150–400)
RBC: 2.77 MIL/uL — ABNORMAL LOW (ref 3.87–5.11)
RBC: 2.58 MIL/uL — ABNORMAL LOW (ref 3.87–5.11)
RDW: 14.7 % (ref 11.5–15.5)
RDW: 14.6 % (ref 11.5–15.5)
WBC: 20.3 10*3/uL — ABNORMAL HIGH (ref 4.0–10.5)
WBC: 17.3 10*3/uL — ABNORMAL HIGH (ref 4.0–10.5)
nRBC: 1.4 % — ABNORMAL HIGH (ref 0.0–0.2)
nRBC: 2.5 % — ABNORMAL HIGH (ref 0.0–0.2)

## 2019-03-10 MED ORDER — ENSURE ENLIVE PO LIQD
237.0000 mL | Freq: Every day | ORAL | Status: DC
  Administered 2019-03-10 – 2019-03-12 (×3): 237 mL via ORAL

## 2019-03-10 MED ORDER — IPRATROPIUM-ALBUTEROL 0.5-2.5 (3) MG/3ML IN SOLN: 3.0000 mL | Freq: Four times a day (QID) | RESPIRATORY_TRACT | Status: DC | PRN

## 2019-03-10 MED ORDER — METOPROLOL TARTRATE 12.5 MG HALF TABLET
12.5000 mg | ORAL_TABLET | Freq: Two times a day (BID) | ORAL | Status: DC
  Administered 2019-03-10 – 2019-03-13 (×7): 12.5 mg via ORAL
  Filled 2019-03-10 (×7): qty 1

## 2019-03-10 MED ORDER — ADULT MULTIVITAMIN W/MINERALS CH
1.0000 | ORAL_TABLET | Freq: Every day | ORAL | Status: DC
  Administered 2019-03-10 – 2019-03-13 (×4): 1 via ORAL
  Filled 2019-03-10 (×4): qty 1

## 2019-03-10 NOTE — Progress Notes (Signed)
PROGRESS NOTE    Virginia Thompson  [id number] DOB: 14-Nov-1928 DOA: 03/06/2019 PCP: Angeline Slim, MD   Brief Narrative: Virginia Thompson is a 86 y.o. 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 Patient presented secondary to worsening left hip pain and found to have a left hip fracture. She is s/p intramedullary nailing of left femur on 1/8. Post-surgery, she developed acute blood loss anemia requiring 1 unit of PRBC to date.   Assessment & Plan:   Principal Problem:   Closed left hip fracture, initial encounter [facility]) Active Problems:   Diastolic CHF, chronic (HCC)   Autoimmune hemolytic anemia   Left hip fracture Does not appear to be secondary to trauma. Patient is on alendronate as an outpatient. Orthopedic surgery planning surgical management today. -Orthopedic surgery recommendations: Lovenox, PT, WBAT -Continue Morphine prn  Chronic diastolic heart failure Euvolemic at this time. Per IO, patient is up 1.9 L for this admission -Hold lasix and metoprolol  Hypotension Patient has received multiple boluses with minimal improvement. Lactic acid normal. Patient otherwise asymptomatic. Appears patient has chronic soft blood pressure with a history of hypotension in the past. Blood pressure improved today. -Continue midodrine 5 mg TID  Chronic respiratory failure Patient is on 2L via nasal canula after a bad pneumonia s/p chemotherapy. Patient with increased tachypnea today -Chest x-ray  History of autoimmune hemolytic anemia Patient is on prednisone as an outpatient. Started on hydrocortisone for stress dosing. LDH trended up slightly. Hemoglobin stable after 1 unit of PRBC. -Continue prednisone today  Acute blood loss anemia Obtained LDH which was normal. Unlikely hemolysis and is all post-surgery. Patient is s/p 1 unit of PRBC on 1/9. Hemoglobin improved.  CBC pending today -Daily CBC  Chronic bronchitis Patient on Spiriva and albuterol as an outpatient -Duonebs  Restless leg syndrome On Mirapex as an outpatient  Mood disorder On Cymbalta  Chronic pain -Continue buprenorphine  Sleep apnea -CPAP qhs  GERD On Nexium   DVT prophylaxis: SCDs, Lovenox Code Status:   Code Status: Full Code Family Communication: None; will call daughter later today Disposition Plan: Discharge pending orthopedic surgery recommendations, stabilization of blood pressure/hemoglobin and improvement of respiratory symptoms   Consultants:   Orthopedic surgery  Procedures:   03/07/2019: Intramedullary nailing of left femur with open reduction  Antimicrobials:  None    Subjective: Some dyspnea. Some pain of her left leg; rated 4/10. No other issues overnight.  Objective: Vitals:   03/10/19 0450 03/10/19 0700 03/10/19 0820 03/10/19 0857  BP: 113/74 (!) 111/54  (!) 83/48  Pulse: (!) 104 (!) 109 (!) 102 (!) 117  Resp:   18 17  Temp:    99.6 F (37.6 C)  TempSrc:    Oral  SpO2:   98% (!) 86%  Weight:        Intake/Output Summary (Last 24 hours) at 03/10/2019 0902 Last data filed at 03/10/2019 0200 Gross per 24 hour  Intake 680 ml  Output 850 ml  Net -170 ml   Filed Weights   03/08/19 0501 03/09/19 0500 03/10/19 0353  Weight: 98.7 kg 96.6 kg 98.2 kg    Examination:  General exam: Appears calm and comfortable Respiratory system: Rhonchi, diminished breath sounds, mild tachypnea. Cardiovascular system: S1 & S2 heard, Mild tachycardia, normal rhythm. No murmurs, rubs, gallops or clicks. Gastrointestinal system: Abdomen is nondistended, soft and nontender. No organomegaly or masses felt. Normal bowel sounds heard. Central  nervous system: Alert and oriented. No focal neurological deficits. Extremities: No edema. No calf tenderness Skin: No cyanosis. No rashes Psychiatry: Judgement and insight appear normal. Mood & affect appropriate.     Data Reviewed: I have personally reviewed following labs and imaging studies  CBC: Recent Labs  Lab 03/06/19 1900 03/07/19 2201 03/08/19 0323 03/08/19 0902 03/08/19 2331 03/09/19 0002 03/09/19 0739  WBC 14.6* 13.4* 13.7*  --   --  12.3* 17.9*  NEUTROABS 11.4*  --   --   --   --   --   --   HGB 12.5 8.4* 7.5* 7.3* 8.0* 8.0* 8.1*  HCT 40.2 26.4* 24.2* 23.8* 24.7* 24.0* 24.8*  MCV 100.0 99.2 100.0  --   --  95.6 97.6  PLT 299 187 186  --   --  65* [id number]   Basic Metabolic Panel: Recent Labs  Lab 03/06/19 1900 03/07/19 1809 03/07/19 2201 03/08/19 0323 03/09/19 0002 03/10/19 0804  NA 138 137  --  140 139 142  K 3.9 4.0  --  4.2 4.0 3.6  CL 97* 99  --  104 104 104  CO2 30  --   --  29 29 30   GLUCOSE 110* 156*  --  153* 147* 108*  BUN 17 17  --  17 15 15   CREATININE 0.86 0.70 0.94 0.84 0.78 0.75  CALCIUM 8.7*  --   --  7.7* 7.9* 8.3*   GFR: CrCl cannot be calculated (Unknown ideal weight.). Liver Function Tests: Recent Labs  Lab 03/09/19 0739  AST 44*  ALT 19  ALKPHOS 39  BILITOT 1.1  PROT 5.1*  ALBUMIN 2.6*   No results for input(s): LIPASE, AMYLASE in the last 168 hours. No results for input(s): AMMONIA in the last 168 hours. Coagulation Profile: No results for input(s): INR, PROTIME in the last 168 hours. Cardiac Enzymes: No results for input(s): CKTOTAL, CKMB, CKMBINDEX, TROPONINI in the last 168 hours. BNP (last 3 results) No results for input(s): PROBNP in the last 8760 hours. HbA1C: No results for input(s): HGBA1C in the last 72 hours. CBG: Recent Labs  Lab 03/09/19 0803 03/09/19 1225 03/09/19 1633 03/10/19 0236 03/10/19 0834  GLUCAP 114* 136* 147* 94 74   Lipid Profile: No results for input(s): CHOL, HDL, LDLCALC, TRIG, CHOLHDL, LDLDIRECT in the last 72 hours. Thyroid Function Tests: No results for input(s): TSH, T4TOTAL, FREET4, T3FREE, THYROIDAB in the last 72 hours. Anemia Panel: Recent Labs    03/08/19 1130  FERRITIN 128  TIBC  175*  IRON 13*   Sepsis Labs: Recent Labs  Lab 03/07/19 0945 03/07/19 2001 03/08/19 0814  LATICACIDVEN 1.0 1.4 1.4    Recent Results (from the past 240 hour(s))  SARS CORONAVIRUS 2 (TAT 6-24 HRS) Nasopharyngeal Nasopharyngeal Swab     Status: None   Collection Time: 03/06/19  9:57 PM   Specimen: Nasopharyngeal Swab  Result Value Ref Range Status   SARS Coronavirus 2 NEGATIVE NEGATIVE Final    Comment: (NOTE) SARS-CoV-2 target nucleic acids are NOT DETECTED. The SARS-CoV-2 RNA is generally detectable in upper and lower respiratory specimens during the acute phase of infection. Negative results do not preclude SARS-CoV-2 infection, do not rule out co-infections with other pathogens, and should not be used as the sole basis for treatment or other patient management decisions. Negative results must be combined with clinical observations, patient history, and epidemiological information. The expected result is Negative. Fact Sheet for Patients: SugarRoll.be Fact Sheet for Healthcare Providers: https://www.woods-mathews.com/ This test  is not yet approved or cleared by the [place] and  has been authorized for detection and/or diagnosis of SARS-CoV-2 by FDA under an Emergency Use Authorization (EUA). This EUA will remain  in effect (meaning this test can be used) for the duration of the COVID-19 declaration under Section 56 4(b)(1) of the Act, 21 U.S.C. section 360bbb-3(b)(1), unless the authorization is terminated or revoked sooner. Performed at [facility], [place] [address] [place], [place] [address]          Radiology Studies: ECHOCARDIOGRAM COMPLETE  Result Date: 03/08/2019   ECHOCARDIOGRAM REPORT   Patient Name:   Virginia Thompson Date of Exam: 03/08/2019 Medical Rec #:  [id number]      Height:       62.0 in Accession #:    [id number]     Weight:       217.6 lb Date of Birth:  03/13/1928     BSA:          1.98 m  Patient Age:    31 years       BP:           68/57 mmHg Patient Gender: F              HR:           95 bpm. Exam Location:  Inpatient Procedure: 2D Echo Indications:    Aortic Stenosis 424.1/135.0  History:        Patient has prior history of Echocardiogram examinations, most                 recent 10/06/2004. CHF; Risk Factors:Hypertension.  Sonographer:    Clayton Lefort RDCS (AE) Referring Phys: [phone] Amardeep Beckers A Jaaliyah Lucatero  Sonographer Comments: Limited patient mobility. IMPRESSIONS  1. Left ventricular ejection fraction, by visual estimation, is 60 to 65%. The left ventricle has normal function. There is moderately increased left ventricular hypertrophy.  2. Elevated left atrial pressure.  3. Global right ventricle has normal systolic function.The right ventricular size is normal.  4. Left atrial size was severely dilated.  5. Right atrial size was normal.  6. Moderate mitral annular calcification.  7. The mitral valve is normal in structure. No evidence of mitral valve regurgitation. No evidence of mitral stenosis.  8. The tricuspid valve is normal in structure.  9. The aortic valve has an indeterminant number of cusps. Aortic valve regurgitation is not visualized. Moderate aortic valve stenosis. 10. The pulmonic valve was not well visualized. Pulmonic valve regurgitation is not visualized. 11. The left ventricle has no regional wall motion abnormalities. FINDINGS  Left Ventricle: Left ventricular ejection fraction, by visual estimation, is 60 to 65%. The left ventricle has normal function. The left ventricle has no regional wall motion abnormalities. There is moderately increased left ventricular hypertrophy. Elevated left atrial pressure. Right Ventricle: The right ventricular size is normal.Global RV systolic function is has normal systolic function. Left Atrium: Left atrial size was severely dilated. Right Atrium: Right atrial size was normal in size Pericardium: There is no evidence of pericardial effusion. Mitral  Valve: The mitral valve is normal in structure. Moderate mitral annular calcification. No evidence of mitral valve regurgitation. No evidence of mitral valve stenosis by observation. MV peak gradient, 19.2 mmHg. Tricuspid Valve: The tricuspid valve is normal in structure. Tricuspid valve regurgitation is trivial. Aortic Valve: The aortic valve has an indeterminant number of cusps. Aortic valve regurgitation is not visualized. Moderate aortic stenosis is present. Aortic valve mean  gradient measures 20.0 mmHg. Aortic valve peak gradient measures 32.5 mmHg. Aortic valve area, by VTI measures 1.13 cm. Pulmonic Valve: The pulmonic valve was not well visualized. Pulmonic valve regurgitation is not visualized. Pulmonic regurgitation is not visualized. Aorta: The aortic root is normal in size and structure. Venous: The inferior vena cava was not well visualized. IAS/Shunts: No atrial level shunt detected by color flow Doppler. Additional Comments: Normal LV systolic function; moderate LVH; moderate AS (mean gradient 21 mmHg); mean gradient across MV elevated (11 mmHg) but no MS by pressure half time; severe LAE.  LEFT VENTRICLE PLAX 2D LVIDd:         3.40 cm  Diastology LVIDs:         3.28 cm  LV e' lateral:   5.44 cm/s LV PW:         1.77 cm  LV E/e' lateral: 31.6 LV IVS:        1.72 cm  LV e' medial:    7.51 cm/s LVOT diam:     1.80 cm  LV E/e' medial:  22.9 LV SV:         4 ml LV SV Index:   1.85 LVOT Area:     2.54 cm  RIGHT VENTRICLE RV Basal diam:  2.79 cm RV S prime:     13.90 cm/s TAPSE (M-mode): 2.1 cm LEFT ATRIUM              Index       RIGHT ATRIUM           Index LA diam:        4.40 cm  2.22 cm/m  RA Area:     11.50 cm LA Vol (A2C):   109.0 ml 55.01 ml/m RA Volume:   20.80 ml  10.50 ml/m LA Vol (A4C):   81.6 ml  41.18 ml/m LA Biplane Vol: 94.7 ml  47.79 ml/m  AORTIC VALVE AV Area (Vmax):    1.26 cm AV Area (Vmean):   1.14 cm AV Area (VTI):     1.13 cm AV Vmax:           285.00 cm/s AV Vmean:           214.000 cm/s AV VTI:            0.607 m AV Peak Grad:      32.5 mmHg AV Mean Grad:      20.0 mmHg LVOT Vmax:         141.50 cm/s LVOT Vmean:        95.450 cm/s LVOT VTI:          0.270 m LVOT/AV VTI ratio: 0.45  AORTA Ao Root diam: 2.80 cm MITRAL VALVE                         TRICUSPID VALVE MV Area (PHT): 3.19 cm              TR Peak grad:   15.8 mmHg MV Peak grad:  19.2 mmHg             TR Vmax:        266.00 cm/s MV Mean grad:  11.0 mmHg MV Vmax:       2.19 m/s              SHUNTS MV Vmean:      154.0 cm/s            Systemic VTI:  0.27 m MV VTI:        0.43 m                Systemic Diam: 1.80 cm MV PHT:        69.02 msec MV Decel Time: 238 msec MV E velocity: 172.00 cm/s 103 cm/s MV A velocity: 185.00 cm/s 70.3 cm/s MV E/A ratio:  0.93        1.5  Kirk Ruths MD Electronically signed by Kirk Ruths MD Signature Date/Time: 03/08/2019/2:43:58 PM    Final         Scheduled Meds: . Derrill Memo ON 03/11/2019] buprenorphine  1 patch Transdermal Weekly  . docusate sodium  100 mg Oral BID  . enoxaparin (LOVENOX) injection  40 mg Subcutaneous Q24H  . metoprolol tartrate  12.5 mg Oral BID  . midodrine  5 mg Oral TID WC  . pantoprazole  40 mg Oral Daily  . predniSONE  5 mg Oral Daily  . umeclidinium bromide  1 puff Inhalation Daily   Continuous Infusions: . lactated ringers 10 mL/hr at 03/07/19 2129     LOS: 4 days     Cordelia Poche, MD [facility] 03/10/2019, 9:02 AM  If 7PM-7AM, please contact night-coverage [email or web address]

## 2019-03-10 NOTE — Progress Notes (Signed)
Nutrition Follow-up  RD working remotely.  DOCUMENTATION CODES:   Obesity unspecified  INTERVENTION:   -Ensure Enlive po daily, each supplement provides 350 kcal and 20 grams of protein -MVI with minerals daily  NUTRITION DIAGNOSIS:   Increased nutrient needs related to post-op healing as evidenced by estimated needs.  Ongoing  GOAL:   Patient will meet greater than or equal to 90% of their needs  Progressing   MONITOR:   PO intake, Supplement acceptance, Labs, Weight trends, Skin, I & O's  REASON FOR ASSESSMENT:   Consult Hip fracture protocol  ASSESSMENT:   Patient is an obese 86 yo female who has a hx of CHF, HTN, lymphoma. Autoimmune hemolytic anemia. Chronic prednisone therapy.  Presents with complaint of leg pain. CT findings - closed left hip fracture.  1/8- s/p Procedure(s): INTRAMEDULLARY (IM) NAILING OF LEFT FEMUR WITH OPEN REDUCTION USING PLATING AND 10 X 360 MM STATICALLY LOCKED ZIMMER NATURAL NAIL  Attempted to speak with pt via phone, however, no answer.   Pt's intake has been variable this admission; noted meal completion 25-100%. Pt would benefit from addition of nutritional supplements due to increased nutritional needs from post-operative healing.   Per MD notes, pt will likely needs SNF at discharge.   Medication reviewed and include prednisone.   Labs reviewed: CBGS: 74-147.   Diet Order:   Diet Order            Diet Heart Room service appropriate? Yes; Fluid consistency: Thin  Diet effective now              EDUCATION NEEDS:   Not appropriate for education at this time  Skin:  Skin Assessment: Skin Integrity Issues: Skin Integrity Issues:: Incisions Incisions: closed lt leg, lt groin  Last BM:  03/05/18  Height:   Ht Readings from Last 1 Encounters:  04/08/16 5\' 2"  (1.575 m)    Weight:   Wt Readings from Last 1 Encounters:  03/10/19 98.2 kg    Ideal Body Weight:  50 kg  BMI:  Body mass index is 39.6  kg/m.  Estimated Nutritional Needs:   Kcal:  [id number]  Protein:  85-100 grams  Fluid:  >1.7 L    Aaliyah Cancro A. Jimmye Norman, RD, LDN, [place] Registered Dietitian II Certified Diabetes Care and Education Specialist Pager: [phone] After hours Pager: [phone]

## 2019-03-10 NOTE — Care Management Important Message (Signed)
Important Message  Patient Details  Name: Virginia Thompson MRN: [id number] Date of Birth: 02/10/1929   Medicare Important Message Given:  Yes     Shelda Altes 03/10/2019, 12:52 PM

## 2019-03-10 NOTE — Progress Notes (Signed)
Orthopaedic Trauma Service Progress Note  Patient ID: Virginia Thompson MRN: [id number] DOB/AGE: 11-04-1928 86 y.o.  Subjective:  Doing ok  Very sore Sat up for several hours yesterday   Thinks she has been on fosamax at least 10 years, possibly longer  Lives with daughter in single story house. Has ramp to gain entry to dwelling   Pt was sat'ing at 96% on 2L [place] during my eval. Still tachy   ROS As above  Objective:   VITALS:   Vitals:   03/10/19 0450 03/10/19 0700 03/10/19 0820 03/10/19 0857  BP: 113/74 (!) 111/54  (!) 83/48  Pulse: (!) 104 (!) 109 (!) 102 (!) 117  Resp:   18 17  Temp:    99.6 F (37.6 C)  TempSrc:    Oral  SpO2:   98% (!) 86%  Weight:        Estimated body mass index is 39.6 kg/m as calculated from the following:   Height as of 04/08/16: 5\' 2"  (1.575 m).   Weight as of this encounter: 98.2 kg.   Intake/Output      01/10 0701 - 01/11 0700 01/11 0701 - 01/12 0700   P.O. 1040 110   Blood     Total Intake(mL/kg) 1040 (10.6) 110 (1.1)   Urine (mL/kg/hr) 1275 (0.5)    Total Output 1275    Net -235 +110          LABS  Results for orders placed or performed during the hospital encounter of 03/06/19 (from the past 24 hour(s))  Glucose, capillary     Status: Abnormal   Collection Time: 03/09/19 12:25 PM  Result Value Ref Range   Glucose-Capillary 136 (H) 70 - 99 mg/dL   Comment 1 Notify RN    Comment 2 Document in Chart   Glucose, capillary     Status: Abnormal   Collection Time: 03/09/19  4:33 PM  Result Value Ref Range   Glucose-Capillary 147 (H) 70 - 99 mg/dL   Comment 1 Notify RN    Comment 2 Document in Chart   Glucose, capillary     Status: None   Collection Time: 03/10/19  2:36 AM  Result Value Ref Range   Glucose-Capillary 94 70 - 99 mg/dL   Comment 1 Notify RN    Comment 2 Document in Chart   Basic metabolic panel     Status: Abnormal   Collection Time:  03/10/19  8:04 AM  Result Value Ref Range   Sodium 142 135 - 145 mmol/L   Potassium 3.6 3.5 - 5.1 mmol/L   Chloride 104 98 - 111 mmol/L   CO2 30 22 - 32 mmol/L   Glucose, Bld 108 (H) 70 - 99 mg/dL   BUN 15 8 - 23 mg/dL   Creatinine, Ser 0.75 0.44 - 1.00 mg/dL   Calcium 8.3 (L) 8.9 - 10.3 mg/dL   GFR calc non Af Amer >60 >60 mL/min   GFR calc Af Amer >60 >60 mL/min   Anion gap 8 5 - 15  Glucose, capillary     Status: None   Collection Time: 03/10/19  8:34 AM  Result Value Ref Range   Glucose-Capillary 74 70 - 99 mg/dL     PHYSICAL EXAM:   Gen: sitting up in bed, pleasant  Lungs: subtle expiratory  wheezes B  Cardiac: tachy but regular  Ext:       Left Lower Extremity   Dressings L thigh stable  Incisions look good   No signs of infection   Small blister mid-lateral thigh stable  Distal motor and sensory functions at baseline   Ext warm   + DP pulse  No DCT   Swelling well controlled   Assessment/Plan: 3 Days Post-Op   Principal Problem:   Closed left hip fracture, initial encounter [facility]) Active Problems:   Diastolic CHF, chronic (HCC)   Autoimmune hemolytic anemia   Anti-infectives (From admission, onward)   Start     Dose/Rate Route Frequency Ordered Stop   03/08/19 0600  ceFAZolin (ANCEF) IVPB 2g/100 mL premix     2 g 200 mL/hr over 30 Minutes Intravenous On call to O.R. 03/07/19 1205 03/08/19 0720   03/07/19 2200  ceFAZolin (ANCEF) IVPB 1 g/50 mL premix     1 g 100 mL/hr over 30 Minutes Intravenous Every 6 hours 03/07/19 2059 03/08/19 1559   03/07/19 1300  ceFAZolin (ANCEF) IVPB 2g/100 mL premix     2 g 200 mL/hr over 30 Minutes Intravenous On call to O.R. 03/07/19 [id number] 03/08/19 0559    .  POD/HD#: 35  86 y/o female with atraumatic fracture L femur   -bisphosphonate associated L subtrochanteric femur fracture s/p ORIF and IMN  Fracture pattern very characteristic of what is seen is with prolonged bisphosphonate use.  This is important to identify  because they typically have a protracted healing course and may require subsequent intervention in several months.  Will continue to monitor closely .  Will try to arrange for low intensity pulsed ultrasound (LIPUS) bone growth stimulator to assist in the healing process    WBAT L lower extremity with assistance  ROM as tolerated L hip and knee    Dressing changes as needed   Ok to shower and clean wounds with soap and water  Ice and elevate  PT/OT   Dc bisphosphonates indefinitely     - Pain management:  Chronic pain    Continue with current regimen   - ABL anemia/Hemodynamics  Tachycardic along with some hypotension (acute on chronic?)  CBC pending for today   Follow up on labs  - Medical issues   Per medical team   - DVT/PE prophylaxis:  lovenox x 30 days for DVT/PE prophylaxis   - ID:   periop abx completed  - Metabolic Bone Disease:  Check vitamin d   Will need DEXA as outpt   Explore additional agents to treat metabolic bone disease  - Activity:  Up with assistance  WBAT L lex   - FEN/GI prophylaxis/Foley/Lines:  Heart healthy diet  - Impediments to fracture healing:  Prolonged bisphosphonate use   COPD, CHF   Chronic opioid use   Chronic prednisone use    - Dispo:  Continue with therapies  Will likely need SNF   Ortho issues stable     Weightbearing: WBAT LLE Insicional and dressing care: Daily dressing changes with 4x4 and tape or mepilex type dressing  Orthopedic device(s): walker Showering: ok to clean wounds with soap and water only  VTE prophylaxis: Lovenox 40mg  qd 30 days Pain control: home regimen  Follow - up plan: 2 weeks Contact information:  Altamese Cofield MD, Ainsley Spinner PA-C   Jari Pigg, PA-C [phone] (C) 03/10/2019, 9:19 AM  [facility] [place] [address] [phone] Domingo Sep (F)  After 6pm on weekdays please call office number to get in touch with on call provider  or refer to [place] and look to see who is on call for the Sports Medicine Call Group which is listed under orthopaedics   On Weekends please call office number to get in touch with on call provider or refer to [place] and look to see who is on call for the Sports Medicine Call Group which is listed under orthopaedics

## 2019-03-10 NOTE — Progress Notes (Signed)
RT asked physician to change Incruse to Duoneb tx. Patient is unable to suck in. Patient stated she was told to blow into the incruse. Directed patient on how to use the Incruse and she was still unable to perform as directed.

## 2019-03-10 NOTE — Progress Notes (Signed)
Physical Therapy Treatment Patient Details Name: Virginia Thompson MRN: [id number] DOB: 01-18-1929 Today's Date: 03/10/2019    History of Present Illness 86 yo female sustained L femur fx s/p ORIF IM nailing on 1/8 PMH CHF autoimmune hemolytic anemia mood disorder chronic pain chronic bronchitis    PT Comments    Patient seen for mobility progression. Pt requires mod/max A +2 for all mobility this session. Stedy standing frame utilized for safety with OOB transfers. Continue to progress as tolerated with anticipated d/c to SNF for further skilled PT services.      Follow Up Recommendations  SNF     Equipment Recommendations  None recommended by PT    Recommendations for Other Services       Precautions / Restrictions Precautions Precautions: Fall Restrictions Weight Bearing Restrictions: Yes LLE Weight Bearing: Weight bearing as tolerated    Mobility  Bed Mobility Overal bed mobility: Needs Assistance Bed Mobility: Supine to Sit;Sit to Supine     Supine to sit: Max assist;+2 for physical assistance;HOB elevated Sit to supine: Mod assist;+2 for physical assistance   General bed mobility comments: assist to mobilize L LE, scoot hips to EOB, and elevate trunk into sitting; cues for sequencing  Transfers Overall transfer level: Needs assistance Equipment used: Ambulation equipment used Transfers: Sit to/from Stand Sit to Stand: Max assist;+2 physical assistance;Mod assist;+2 safety/equipment         General transfer comment: max A +2 to power up into standing from EOB and BSC and mod A  +2 from Stedy standing frame; pt unable to achieve full upright posture; multimodal cues for posture and assistance for hips extension in standing  Ambulation/Gait                 Stairs             Wheelchair Mobility    Modified Rankin (Stroke Patients Only)       Balance Overall balance assessment: Needs assistance;History of Falls Sitting-balance support:  Bilateral upper extremity supported;Feet supported;Single extremity supported Sitting balance-Leahy Scale: Poor     Standing balance support: During functional activity;Bilateral upper extremity supported Standing balance-Leahy Scale: Poor                              Cognition Arousal/Alertness: Awake/alert Behavior During Therapy: WFL for tasks assessed/performed Overall Cognitive Status: Within Functional Limits for tasks assessed                                        Exercises      General Comments        Pertinent Vitals/Pain Pain Assessment: Faces Faces Pain Scale: Hurts even more Pain Location: L LE Pain Descriptors / Indicators: Grimacing;Guarding Pain Intervention(s): Limited activity within patient's tolerance;Monitored during session;Repositioned    Home Living                      Prior Function            PT Goals (current goals can now be found in the care plan section) Progress towards PT goals: Progressing toward goals    Frequency    Min 3X/week      PT Plan Current plan remains appropriate    Co-evaluation              AM-PAC PT "6  Clicks" Mobility   Outcome Measure  Help needed turning from your back to your side while in a flat bed without using bedrails?: A Lot Help needed moving from lying on your back to sitting on the side of a flat bed without using bedrails?: A Lot Help needed moving to and from a bed to a chair (including a wheelchair)?: A Lot Help needed standing up from a chair using your arms (e.g., wheelchair or bedside chair)?: A Lot Help needed to walk in hospital room?: Total Help needed climbing 3-5 steps with a railing? : Total 6 Click Score: 10    End of Session Equipment Utilized During Treatment: Oxygen Activity Tolerance: Patient limited by fatigue;Patient limited by pain Patient left: in bed;with call bell/phone within reach;with bed alarm set;Other (comment)(bed in  chair position) Nurse Communication: Mobility status PT Visit Diagnosis: Muscle weakness (generalized) (M62.81);Repeated falls (R29.6);Unsteadiness on feet (R26.81);Pain Pain - Right/Left: Left Pain - part of body: Leg     Time: [id number] PT Time Calculation (min) (ACUTE ONLY): 55 min  Charges:  $Gait Training: 23-37 mins $Therapeutic Activity: 8-22 mins                     Earney Navy, PTA Acute Rehabilitation Services Pager: [phone] Office: [phone]     Darliss Cheney 03/10/2019, 4:39 PM

## 2019-03-11 ENCOUNTER — Encounter: Payer: Self-pay | Admitting: *Deleted

## 2019-03-11 LAB — VITAMIN D 25 HYDROXY (VIT D DEFICIENCY, FRACTURES): Vit D, 25-Hydroxy: 26.18 ng/mL — ABNORMAL LOW (ref 30–100)

## 2019-03-11 LAB — CBC
HCT: 24.1 % — ABNORMAL LOW (ref 36.0–46.0)
Hemoglobin: 7.4 g/dL — ABNORMAL LOW (ref 12.0–15.0)
MCH: 31.1 pg (ref 26.0–34.0)
MCHC: 30.7 g/dL (ref 30.0–36.0)
MCV: 101.3 fL — ABNORMAL HIGH (ref 80.0–100.0)
Platelets: 247 10*3/uL (ref 150–400)
RBC: 2.38 MIL/uL — ABNORMAL LOW (ref 3.87–5.11)
RDW: 14.7 % (ref 11.5–15.5)
WBC: 12.9 10*3/uL — ABNORMAL HIGH (ref 4.0–10.5)
nRBC: 5.7 % — ABNORMAL HIGH (ref 0.0–0.2)

## 2019-03-11 LAB — GLUCOSE, CAPILLARY
Glucose-Capillary: 132 mg/dL — ABNORMAL HIGH (ref 70–99)
Glucose-Capillary: 119 mg/dL — ABNORMAL HIGH (ref 70–99)
Glucose-Capillary: 223 mg/dL — ABNORMAL HIGH (ref 70–99)

## 2019-03-11 MED ORDER — IPRATROPIUM-ALBUTEROL 0.5-2.5 (3) MG/3ML IN SOLN: 3.0000 mL | Freq: Four times a day (QID) | RESPIRATORY_TRACT | Status: DC

## 2019-03-11 MED ORDER — AZITHROMYCIN 500 MG PO TABS
500.0000 mg | ORAL_TABLET | Freq: Every day | ORAL | Status: AC
  Administered 2019-03-11 – 2019-03-13 (×3): 500 mg via ORAL
  Filled 2019-03-11 (×3): qty 1

## 2019-03-11 MED ORDER — MIDODRINE HCL 5 MG PO TABS
5.0000 mg | ORAL_TABLET | Freq: Three times a day (TID) | ORAL | Status: DC
  Administered 2019-03-11: 5 mg via ORAL
  Filled 2019-03-11: qty 1

## 2019-03-11 MED ORDER — IPRATROPIUM-ALBUTEROL 0.5-2.5 (3) MG/3ML IN SOLN
3.0000 mL | Freq: Four times a day (QID) | RESPIRATORY_TRACT | Status: DC
  Administered 2019-03-11: 3 mL via RESPIRATORY_TRACT
  Filled 2019-03-11: qty 3

## 2019-03-11 MED ORDER — PREDNISONE 20 MG PO TABS
40.0000 mg | ORAL_TABLET | Freq: Every day | ORAL | Status: DC
  Administered 2019-03-11 – 2019-03-13 (×3): 40 mg via ORAL
  Filled 2019-03-11 (×3): qty 2

## 2019-03-11 MED ORDER — IPRATROPIUM-ALBUTEROL 0.5-2.5 (3) MG/3ML IN SOLN
3.0000 mL | Freq: Three times a day (TID) | RESPIRATORY_TRACT | Status: DC
  Administered 2019-03-11 – 2019-03-13 (×6): 3 mL via RESPIRATORY_TRACT
  Filled 2019-03-11 (×6): qty 3

## 2019-03-11 MED ORDER — MIDODRINE HCL 5 MG PO TABS
10.0000 mg | ORAL_TABLET | Freq: Three times a day (TID) | ORAL | Status: DC
  Administered 2019-03-11 – 2019-03-13 (×6): 10 mg via ORAL
  Filled 2019-03-11 (×5): qty 2

## 2019-03-11 NOTE — Progress Notes (Signed)
PROGRESS NOTE    THEODORE BARTNICKI  [id number] DOB: October 21, 1928 DOA: 03/06/2019 PCP: Angeline Slim, MD   Brief Narrative: Virginia Thompson is a 86 y.o. 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 Patient presented secondary to worsening left hip pain and found to have a left hip fracture. She is s/p intramedullary nailing of left femur on 1/8. Post-surgery, she developed acute blood loss anemia requiring 1 unit of PRBC to date.   Assessment & Plan:   Principal Problem:   Closed left hip fracture, initial encounter [facility]) Active Problems:   Diastolic CHF, chronic (HCC)   Autoimmune hemolytic anemia   Left hip fracture Does not appear to be secondary to trauma. Patient is on alendronate as an outpatient. Orthopedic surgery planning surgical management today. -Orthopedic surgery recommendations: Lovenox, PT, WBAT -Continue Morphine prn  Chronic diastolic heart failure Euvolemic at this time. Per IO, patient is up 1.9 L for this admission -Hold lasix -Metoprolol restarted  Hypotension Patient has received multiple boluses with minimal improvement. Lactic acid normal. Patient otherwise asymptomatic. Appears patient has chronic soft blood pressure with a history of hypotension in the past. Blood pressure improved today. -Increase to midodrine 10 mg TID  Chronic respiratory failure Patient is on 2L via nasal canula after a bad pneumonia s/p chemotherapy. Patient with increased tachypnea. Possible interstitial edema on chest x-ray from 1/11. Breathing improved today per patient  COPD exacerbation Patient with dyspnea, cough, wheezing on exam. -Increase to prednisone 40 mg daily for at least 3-5 days; will need to be transitioned back to home dose of 5 mg daily when course is complete -Schedule Duoneb QID -Azithromycin 500 mg qd x3 days  Tachycardia Likely rebound  from cessation of metoprolol rather than from hypotension. Metoprolol restarted with improvement.  History of autoimmune hemolytic anemia Patient is on prednisone as an outpatient. Started on hydrocortisone for stress dosing. LDH trended up slightly. Hemoglobin stabilized initially with slight drop yesterday. CBC pending today -Continue prednisone today -CBC  Acute blood loss anemia Obtained LDH which was normal. Unlikely hemolysis and is all post-surgery. Patient is s/p 1 unit of PRBC on 1/9. Hemoglobin improved. CBC pending today -Daily CBC  Leukocytosis Secondary to Solu-cortef. Afebrile. No apparent source of infection.   Chronic bronchitis Patient on Spiriva and albuterol as an outpatient -Duonebs  Restless leg syndrome On Mirapex as an outpatient  Mood disorder On Cymbalta  Chronic pain -Continue buprenorphine  Sleep apnea -CPAP qhs  GERD On Nexium   DVT prophylaxis: SCDs, Lovenox Code Status:   Code Status: Full Code Family Communication: Daughter on telephone; message left. Disposition Plan: Discharge to SNF pending orthopedic surgery recommendations, stabilization of blood pressure/hemoglobin and improvement of respiratory symptoms   Consultants:   Orthopedic surgery  Procedures:   03/07/2019: Intramedullary nailing of left femur with open reduction  Antimicrobials:  None    Subjective: Breathing better. Still with leg pain mostly around the knee.  Objective: Vitals:   03/10/19 2222 03/11/19 0529 03/11/19 0533 03/11/19 0623  BP:   (!) 84/52 (!) 83/64  Pulse: (!) 104  82 91  Resp: 20  20   Temp:   97.7 F (36.5 C)   TempSrc:   Oral   SpO2: 93%  100% (!) 89%  Weight:  101.2 kg      Intake/Output Summary (Last 24 hours) at 03/11/2019 0941 Last data filed at 03/11/2019 0900 Gross per 24 hour  Intake 340 ml  Output 350 ml  Net -10 ml   Filed Weights   03/09/19 0500 03/10/19 0353 03/11/19 0529  Weight: 96.6 kg 98.2 kg 101.2 kg     Examination:  General exam: Appears calm and comfortable. Appears slightly dry today. Respiratory system: Significant wheezing bilaterally, mild tachypnea. Respiratory effort normal. Cardiovascular system: S1 & S2 heard, RRR. No murmurs, rubs, gallops or clicks. Gastrointestinal system: Abdomen is nondistended, soft and nontender. No organomegaly or masses felt. Normal bowel sounds heard. Central nervous system: Alert and oriented. No focal neurological deficits. Extremities: No edema. No calf tenderness Skin: No cyanosis. No rashes Psychiatry: Judgement and insight appear normal. Mood & affect appropriate.    Data Reviewed: I have personally reviewed following labs and imaging studies  CBC: Recent Labs  Lab 03/06/19 1900 03/07/19 1809 03/08/19 0323 03/08/19 2331 03/09/19 0002 03/09/19 0739 03/10/19 0804 03/10/19 1747  WBC 14.6*   < > 13.7*  --  12.3* 17.9* 20.3* 17.3*  NEUTROABS 11.4*  --   --   --   --   --   --   --   HGB 12.5  --  7.5* 8.0* 8.0* 8.1* 8.7* 7.9*  HCT 40.2  --  24.2* 24.7* 24.0* 24.8* 27.1* 25.7*  MCV 100.0   < > 100.0  --  95.6 97.6 97.8 99.6  PLT 299   < > 186  --  65* 186 229 244   < > = values in this interval not displayed.   Basic Metabolic Panel: Recent Labs  Lab 03/06/19 1900 03/07/19 1809 03/07/19 2201 03/08/19 0323 03/09/19 0002 03/10/19 0804  NA 138 137  --  140 139 142  K 3.9 4.0  --  4.2 4.0 3.6  CL 97* 99  --  104 104 104  CO2 30  --   --  29 29 30   GLUCOSE 110* 156*  --  153* 147* 108*  BUN 17 17  --  17 15 15   CREATININE 0.86 0.70 0.94 0.84 0.78 0.75  CALCIUM 8.7*  --   --  7.7* 7.9* 8.3*   GFR: CrCl cannot be calculated (Unknown ideal weight.). Liver Function Tests: Recent Labs  Lab 03/09/19 0739  AST 44*  ALT 19  ALKPHOS 39  BILITOT 1.1  PROT 5.1*  ALBUMIN 2.6*   No results for input(s): LIPASE, AMYLASE in the last 168 hours. No results for input(s): AMMONIA in the last 168 hours. Coagulation Profile: No results  for input(s): INR, PROTIME in the last 168 hours. Cardiac Enzymes: No results for input(s): CKTOTAL, CKMB, CKMBINDEX, TROPONINI in the last 168 hours. BNP (last 3 results) No results for input(s): PROBNP in the last 8760 hours. HbA1C: No results for input(s): HGBA1C in the last 72 hours. CBG: Recent Labs  Lab 03/10/19 0834 03/10/19 1614 03/10/19 2211 03/11/19 0104 03/11/19 0748  GLUCAP 74 148* 140* 132* 119*   Lipid Profile: No results for input(s): CHOL, HDL, LDLCALC, TRIG, CHOLHDL, LDLDIRECT in the last 72 hours. Thyroid Function Tests: No results for input(s): TSH, T4TOTAL, FREET4, T3FREE, THYROIDAB in the last 72 hours. Anemia Panel: Recent Labs    03/08/19 1130  FERRITIN 128  TIBC 175*  IRON 13*   Sepsis Labs: Recent Labs  Lab 03/07/19 0945 03/07/19 2001 03/08/19 0814  LATICACIDVEN 1.0 1.4 1.4    Recent Results (from the past 240 hour(s))  SARS CORONAVIRUS 2 (TAT 6-24 HRS) Nasopharyngeal Nasopharyngeal Swab     Status: None   Collection Time:  03/06/19  9:57 PM   Specimen: Nasopharyngeal Swab  Result Value Ref Range Status   SARS Coronavirus 2 NEGATIVE NEGATIVE Final    Comment: (NOTE) SARS-CoV-2 target nucleic acids are NOT DETECTED. The SARS-CoV-2 RNA is generally detectable in upper and lower respiratory specimens during the acute phase of infection. Negative results do not preclude SARS-CoV-2 infection, do not rule out co-infections with other pathogens, and should not be used as the sole basis for treatment or other patient management decisions. Negative results must be combined with clinical observations, patient history, and epidemiological information. The expected result is Negative. Fact Sheet for Patients: SugarRoll.be Fact Sheet for Healthcare Providers: https://www.woods-mathews.com/ This test is not yet approved or cleared by the [place] FDA and  has been authorized for detection and/or  diagnosis of SARS-CoV-2 by FDA under an Emergency Use Authorization (EUA). This EUA will remain  in effect (meaning this test can be used) for the duration of the COVID-19 declaration under Section 56 4(b)(1) of the Act, 21 U.S.C. section 360bbb-3(b)(1), unless the authorization is terminated or revoked sooner. Performed at [facility], [place] [address] [place], [place] [address]          Radiology Studies: DG CHEST PORT 1 VIEW  Result Date: 03/10/2019 CLINICAL DATA:  Tachypnea.  Shortness of breath. EXAM: PORTABLE CHEST 1 VIEW COMPARISON:  06/17/2006 FINDINGS: Cardiomegaly. Chronic aortic atherosclerosis. Chronic interstitial lung markings. Possible venous hypertension with mild interstitial edema. No consolidation, collapse or visible effusion. IMPRESSION: Cardiomegaly. Aortic atherosclerosis. Chronic lung disease. Possible venous hypertension with mild interstitial edema. Electronically Signed   By: Nelson Chimes M.D.   On: 03/10/2019 10:02        Scheduled Meds: . azithromycin  500 mg Oral Daily  . buprenorphine  1 patch Transdermal Weekly  . docusate sodium  100 mg Oral BID  . enoxaparin (LOVENOX) injection  40 mg Subcutaneous Q24H  . feeding supplement (ENSURE ENLIVE)  237 mL Oral Q2200  . ipratropium-albuterol  3 mL Nebulization QID  . metoprolol tartrate  12.5 mg Oral BID  . midodrine  10 mg Oral TID WC  . multivitamin with minerals  1 tablet Oral Daily  . pantoprazole  40 mg Oral Daily  . predniSONE  40 mg Oral Q breakfast   Continuous Infusions:    LOS: 5 days     Cordelia Poche, MD [facility] 03/11/2019, 9:41 AM  If 7PM-7AM, please contact night-coverage [email or web address]

## 2019-03-12 DIAGNOSIS — D591 Autoimmune hemolytic anemia, unspecified: Secondary | ICD-10-CM

## 2019-03-12 DIAGNOSIS — T148XXA Other injury of unspecified body region, initial encounter: Secondary | ICD-10-CM

## 2019-03-12 DIAGNOSIS — I5032 Chronic diastolic (congestive) heart failure: Secondary | ICD-10-CM

## 2019-03-12 DIAGNOSIS — S72002A Fracture of unspecified part of neck of left femur, initial encounter for closed fracture: Secondary | ICD-10-CM

## 2019-03-12 LAB — CBC
HCT: 23.8 % — ABNORMAL LOW (ref 36.0–46.0)
Hemoglobin: 7.4 g/dL — ABNORMAL LOW (ref 12.0–15.0)
MCH: 31.4 pg (ref 26.0–34.0)
MCHC: 31.1 g/dL (ref 30.0–36.0)
MCV: 100.8 fL — ABNORMAL HIGH (ref 80.0–100.0)
Platelets: 285 10*3/uL (ref 150–400)
RBC: 2.36 MIL/uL — ABNORMAL LOW (ref 3.87–5.11)
RDW: 14.6 % (ref 11.5–15.5)
WBC: 16.4 10*3/uL — ABNORMAL HIGH (ref 4.0–10.5)
nRBC: 7.8 % — ABNORMAL HIGH (ref 0.0–0.2)

## 2019-03-12 LAB — BPAM RBC
Blood Product Expiration Date: 202101272359
Blood Product Expiration Date: 202101272359
ISSUE DATE / TIME: 202101091755
Unit Type and Rh: 6200
Unit Type and Rh: 6200

## 2019-03-12 LAB — TYPE AND SCREEN
ABO/RH(D): A POS
Antibody Screen: NEGATIVE
Unit division: 0
Unit division: 0

## 2019-03-12 LAB — BASIC METABOLIC PANEL
Anion gap: 10 (ref 5–15)
BUN: 16 mg/dL (ref 8–23)
CO2: 28 mmol/L (ref 22–32)
Calcium: 8.2 mg/dL — ABNORMAL LOW (ref 8.9–10.3)
Chloride: 100 mmol/L (ref 98–111)
Creatinine, Ser: 0.74 mg/dL (ref 0.44–1.00)
GFR calc Af Amer: >60 mL/min (ref >60)
GFR calc non Af Amer: >60 mL/min (ref >60)
Glucose, Bld: 150 mg/dL — ABNORMAL HIGH (ref 70–99)
Potassium: 4.4 mmol/L (ref 3.5–5.1)
Sodium: 138 mmol/L (ref 135–145)

## 2019-03-12 LAB — GLUCOSE, CAPILLARY
Glucose-Capillary: 154 mg/dL — ABNORMAL HIGH (ref 70–99)
Glucose-Capillary: 158 mg/dL — ABNORMAL HIGH (ref 70–99)
Glucose-Capillary: 177 mg/dL — ABNORMAL HIGH (ref 70–99)

## 2019-03-12 LAB — PREPARE RBC (CROSSMATCH)

## 2019-03-12 MED ORDER — SODIUM CHLORIDE 0.9% IV SOLUTION: Freq: Once | INTRAVENOUS | Status: AC

## 2019-03-12 NOTE — TOC Progression Note (Addendum)
Transition of Care [facility]) - Progression Note    Patient Details  Name: Virginia Thompson MRN: [id number] Date of Birth: 02-10-1929  Transition of Care [facility]) CM/SW [place], [place] Phone Number: [phone] 03/12/2019, 12:20 PM  Clinical Narrative:     CSW informed of potential dc to SNF tomorrow if patient medically stable, updated COVID test has been ordered by MD and CSW has initiated insurance [place] with [facility] for [place], [place] # [id number].  Patient's daughter Jacqlyn Larsen will be at [facility] tomorrow 1/14 at 11:00 am to complete admission paperwork.   Expected Discharge Plan: Skilled Nursing Facility Barriers to Discharge: Continued Medical Work up, SNF Pending bed offer  Expected Discharge Plan and Services Expected Discharge Plan: [facility]                                               Social Determinants of Health (SDOH) Interventions    Readmission Risk Interventions No flowsheet data found.

## 2019-03-12 NOTE — Progress Notes (Signed)
Occupational Therapy Treatment Patient Details Name: Virginia Thompson MRN: [id number] DOB: 1928/03/16 Today's Date: 03/12/2019    History of present illness 86 yo female sustained L femur fx s/p ORIF IM nailing on 1/8 PMH CHF autoimmune hemolytic anemia mood disorder chronic pain chronic bronchitis   OT comments  Pt making steady progress towards OT goals this session. Session focus on functional transfers and standing balance as precursor to higher level ADLs. Pt requires MAX A +2 for bed mobility and MAX A- min guard for sit>stand with stedy. Pt able to tolerate standing for ~ 4 minutes before needing to sit down. Pt requires total A for LB ADL and continues to be limited by pain and generalized weakness. Continue to recommend SNF at time of DC. Will continue to follow acutely per POC.    Follow Up Recommendations  SNF;Supervision/Assistance - 24 hour    Equipment Recommendations  None recommended by OT    Recommendations for Other Services      Precautions / Restrictions Precautions Precautions: Fall Restrictions Weight Bearing Restrictions: Yes LLE Weight Bearing: Weight bearing as tolerated       Mobility Bed Mobility Overal bed mobility: Needs Assistance Bed Mobility: Supine to Sit     Supine to sit: Max assist;+2 for physical assistance;HOB elevated     General bed mobility comments: assist to bring L LE and hips to EOB and to elevate trunk into sitting; use of rail; cues for sequencing  Transfers Overall transfer level: Needs assistance Equipment used: Ambulation equipment used Transfers: Sit to/from Stand Sit to Stand: +2 physical assistance;Mod assist;Min guard;From elevated surface         General transfer comment: pt stood from elevated bed height with mod A +2 and min guard from Stedy standing frame; cues for positioning of bilat LE prior to standing; increased time for pt to increase L knee flexion in sitting     Balance Overall balance assessment:  Needs assistance;History of Falls Sitting-balance support: Bilateral upper extremity supported;Feet supported;Single extremity supported Sitting balance-Leahy Scale: Poor     Standing balance support: During functional activity;Bilateral upper extremity supported;Single extremity supported Standing balance-Leahy Scale: Poor Standing balance comment: pt requires at least single UE support; worked on standing tolerance utilizing standing frame; pt tolerated weight bearing for 4 mins before sitting in recliner                           ADL either performed or assessed with clinical judgement   ADL Overall ADL's : Needs assistance/impaired                     Lower Body Dressing: Total assistance Lower Body Dressing Details (indicate cue type and reason): total A to don socks in supine Toilet Transfer: +2 for physical assistance;+2 for safety/equipment;Moderate assistance Toilet Transfer Details (indicate cue type and reason): stedy used for transfer this date         Functional mobility during ADLs: +2 for physical assistance;+2 for safety/equipment;Moderate assistance(use of stedy only) General ADL Comments: pt limited by post operative pain, generalized weakness, and decreased activity tolerance for BADL. session focus on functional transfer training and standing balance.     Vision Baseline Vision/History: No visual deficits Patient Visual Report: No change from baseline     Perception     Praxis      Cognition Arousal/Alertness: Awake/alert Behavior During Therapy: WFL for tasks assessed/performed Overall Cognitive Status: Within Functional Limits for  tasks assessed                                          Exercises     Shoulder Instructions       General Comments HR in 90s and SpO2 99% on 2L O2 via [place]    Pertinent Vitals/ Pain       Pain Assessment: 0-10 Pain Score: 8  Pain Location: L LE weight bearing  Pain Descriptors /  Indicators: Grimacing;Guarding Pain Intervention(s): Limited activity within patient's tolerance;Monitored during session;Repositioned;Ice applied  Home Living                                          Prior Functioning/Environment              Frequency  Min 2X/week        Progress Toward Goals  OT Goals(current goals can now be found in the care plan section)  Progress towards OT goals: Progressing toward goals  Acute Rehab OT Goals Patient Stated Goal: go home soon OT Goal Formulation: With patient Time For Goal Achievement: 03/23/19 Potential to Achieve Goals: Good  Plan Discharge plan remains appropriate    Co-evaluation      Reason for Co-Treatment: For patient/therapist safety;To address functional/ADL transfers PT goals addressed during session: Mobility/safety with mobility        AM-PAC OT "6 Clicks" Daily Activity     Outcome Measure   Help from another person eating meals?: None Help from another person taking care of personal grooming?: A Little Help from another person toileting, which includes using toliet, bedpan, or urinal?: A Lot Help from another person bathing (including washing, rinsing, drying)?: A Lot Help from another person to put on and taking off regular upper body clothing?: A Little Help from another person to put on and taking off regular lower body clothing?: Total 6 Click Score: 15    End of Session Equipment Utilized During Treatment: Gait belt;Oxygen;Other (comment)(stedy, 3L O2)  OT Visit Diagnosis: Unsteadiness on feet (R26.81);Other abnormalities of gait and mobility (R26.89);Muscle weakness (generalized) (M62.81);History of falling (Z91.81);Pain Pain - Right/Left: Left Pain - part of body: Hip;Knee;Leg   Activity Tolerance Patient tolerated treatment well   Patient Left in chair;with call bell/phone within reach;with chair alarm set   Nurse Communication          Time: [id number] OT Time  Calculation (min): 35 min  Charges: OT General Charges $OT Visit: 1 Visit OT Treatments $Therapeutic Activity: 8-22 mins  Lanier Clam., COTA/L Acute Rehabilitation Services [phone] [place] 03/12/2019, 10:39 AM

## 2019-03-12 NOTE — Progress Notes (Signed)
Physical Therapy Treatment Patient Details Name: Virginia Thompson MRN: [id number] DOB: 10-21-1928 Today's Date: 03/12/2019    History of Present Illness 86 yo female sustained L femur fx s/p ORIF IM nailing on 1/8 PMH CHF autoimmune hemolytic anemia mood disorder chronic pain chronic bronchitis    PT Comments    Patient continues to make progress toward PT goals and tolerated increased time in standing this session. Stedy standing frame used for safety with OOB transfers. Continue to progress as tolerated with anticipated d/c to SNF for further skilled PT services.      Follow Up Recommendations  SNF     Equipment Recommendations  None recommended by PT    Recommendations for Other Services       Precautions / Restrictions Precautions Precautions: Fall Restrictions Weight Bearing Restrictions: Yes LLE Weight Bearing: Weight bearing as tolerated    Mobility  Bed Mobility Overal bed mobility: Needs Assistance Bed Mobility: Supine to Sit     Supine to sit: Max assist;+2 for physical assistance;HOB elevated     General bed mobility comments: assist to bring L LE and hips to EOB and to elevate trunk into sitting; use of rail; cues for sequencing  Transfers Overall transfer level: Needs assistance Equipment used: Ambulation equipment used Transfers: Sit to/from Stand Sit to Stand: +2 physical assistance;Mod assist;Min guard;From elevated surface         General transfer comment: pt stood from elevated bed height with mod A +2 and min guard from Stedy standing frame; cues for positioning of bilat LE prior to standing; increased time for pt to increase L knee flexion in sitting   Ambulation/Gait                 Stairs             Wheelchair Mobility    Modified Rankin (Stroke Patients Only)       Balance Overall balance assessment: Needs assistance;History of Falls Sitting-balance support: Bilateral upper extremity supported;Feet supported;Single  extremity supported Sitting balance-Leahy Scale: Poor     Standing balance support: During functional activity;Bilateral upper extremity supported;Single extremity supported Standing balance-Leahy Scale: Poor Standing balance comment: pt requires at least single UE support; worked on standing tolerance utilizing standing frame; pt tolerated weight bearing for 4 mins before sitting in recliner                            Cognition Arousal/Alertness: Awake/alert Behavior During Therapy: WFL for tasks assessed/performed Overall Cognitive Status: Within Functional Limits for tasks assessed                                        Exercises      General Comments General comments (skin integrity, edema, etc.): HR in 90s and SpO2 99% on 2L O2 via [place]       Pertinent Vitals/Pain Pain Assessment: 0-10 Pain Score: 8  Pain Location: L LE weight bearing  Pain Descriptors / Indicators: Grimacing;Guarding Pain Intervention(s): Repositioned;Monitored during session;Limited activity within patient's tolerance;Ice applied    Home Living                      Prior Function            PT Goals (current goals can now be found in the care plan section) Progress towards PT goals:  Progressing toward goals    Frequency    Min 3X/week      PT Plan Current plan remains appropriate    Co-evaluation PT/OT/SLP Co-Evaluation/Treatment: Yes Reason for Co-Treatment: For patient/therapist safety;To address functional/ADL transfers PT goals addressed during session: Mobility/safety with mobility        AM-PAC PT "6 Clicks" Mobility   Outcome Measure  Help needed turning from your back to your side while in a flat bed without using bedrails?: A Lot Help needed moving from lying on your back to sitting on the side of a flat bed without using bedrails?: A Lot Help needed moving to and from a bed to a chair (including a wheelchair)?: A Lot Help needed standing  up from a chair using your arms (e.g., wheelchair or bedside chair)?: A Lot Help needed to walk in hospital room?: Total Help needed climbing 3-5 steps with a railing? : Total 6 Click Score: 10    End of Session Equipment Utilized During Treatment: Oxygen;Gait belt Activity Tolerance: Patient tolerated treatment well Patient left: with call bell/phone within reach;in chair;with chair alarm set Nurse Communication: Mobility status PT Visit Diagnosis: Muscle weakness (generalized) (M62.81);Repeated falls (R29.6);Unsteadiness on feet (R26.81);Pain Pain - Right/Left: Left Pain - part of body: Leg     Time: [id number] PT Time Calculation (min) (ACUTE ONLY): 33 min  Charges:  $Gait Training: 8-22 mins                     Earney Navy, PTA Acute Rehabilitation Services Pager: [phone] Office: [phone]     Darliss Cheney 03/12/2019, 10:23 AM

## 2019-03-12 NOTE — Progress Notes (Signed)
PROGRESS NOTE  Virginia Thompson [id number] DOB: [birth year] DOA: 03/06/2019 PCP: Angeline Slim, MD   LOS: 6 days   Brief Narrative / Interim history: 86 year old female with history of diastolic CHF, HTN, lymphoma, autoimmune hemolytic anemia on chronic prednisone therapy, history of ovarian cancer came into the hospital and was admitted on 03/06/2019 due to worsening of left hip pain and found to have a left hip fracture.  Orthopedic surgery was consulted and she is status post intramedullary nailing of left femur on 1/8.  Post surgery she developed acute blood loss anemia requiring 2 U of packed red blood cells.  Subjective / 24h Interval events: She is doing well this morning, tells me she had a rough night, she says that she had very vivid dreams and sometimes having difficulty discerning dreams from reality.  She denies any chest pain, denies any palpitations.  No abdominal pain, nausea vomiting or diarrhea.  Assessment & Plan: Principal Problem Left hip fracture -Orthopedic surgery consulted, status post intramedullary nailing of the left femur on 1/8 -DVT prophylaxis and pain control per orthopedic surgery's, WBAT -PT recommending SNF, patient agreeable  Active Problems Chronic diastolic CHF -Currently appears euvolemic, her Lasix has been on hold  Acute blood loss anemia -Likely in the setting of #1, received a unit of packed red blood cells but hemoglobin drifted back down to at 7.4 and is stable today  Chronic hypotension -Patient was started on midodrine 10 mg 3 times daily, blood pressure improved -Lactic acid is normal, no evidence of infection, he has a history of hypotension in the past  COPD with mild exacerbation with underlying chronic hypoxic respiratory failure -He is on chronic 2 L nasal cannula after pneumonia status post chemotherapy.  She was wheezing more yesterday and was placed on prednisone -No significant clinical signs of fluid overload, respiratory  status stable no wheezing this morning  Sinus tachycardia -Probably rebound from cessation of metoprolol, this was resumed yesterday with improvement in her heart rate  History of autoimmune hemolytic anemia -Continue prednisone, her transfusion requirements are likely due to acute blood loss anemia  Leukocytosis -Likely due to steroids, afebrile, no source of infection  Chronic bronchitis -On Spiriva and albuterol as an outpatient, duo nebs here.  Respiratory status is stable this morning  Restless leg syndrome -On Mirapex as an outpatient  Mood disorder -On Cymbalta  Chronic pain -Continue buprenorphine  Sleep apnea -CPAP qhs  GERD -On Nexium  Scheduled Meds: . sodium chloride   Intravenous Once  . azithromycin  500 mg Oral Daily  . buprenorphine  1 patch Transdermal Weekly  . docusate sodium  100 mg Oral BID  . enoxaparin (LOVENOX) injection  40 mg Subcutaneous Q24H  . feeding supplement (ENSURE ENLIVE)  237 mL Oral Q2200  . ipratropium-albuterol  3 mL Nebulization TID  . metoprolol tartrate  12.5 mg Oral BID  . midodrine  10 mg Oral TID WC  . multivitamin with minerals  1 tablet Oral Daily  . pantoprazole  40 mg Oral Daily  . predniSONE  40 mg Oral Q breakfast   Continuous Infusions: PRN Meds:.acetaminophen, calcium carbonate, HYDROcodone-acetaminophen, HYDROcodone-acetaminophen, metoCLOPramide **OR** metoCLOPramide (REGLAN) injection, morphine injection, morphine injection, ondansetron **OR** ondansetron (ZOFRAN) IV  DVT prophylaxis: Lovenox Code Status: Full code Family Communication: No family at bedside Disposition Plan: SNF likely 1 to 2 days  Consultants:  Orthopedic surgery  Procedures:  IM nailing left femur with open reduction 03/07/2019  Microbiology  None  Antimicrobials: None   Objective:  Vitals:   03/12/19 0514 03/12/19 0534 03/12/19 0859 03/12/19 1102  BP: (!) 72/34 (!) 94/58  (!) 93/51  Pulse: 73 70  90  Resp: 15   18  Temp:  98.5 F (36.9 C)   97.6 F (36.4 C)  TempSrc: Oral   Oral  SpO2: 95%  94% 98%  Weight:        Intake/Output Summary (Last 24 hours) at 03/12/2019 1131 Last data filed at 03/12/2019 0906 Gross per 24 hour  Intake 830 ml  Output 750 ml  Net 80 ml   Filed Weights   03/10/19 0353 03/11/19 0529 03/12/19 0504  Weight: 98.2 kg 101.2 kg 102.2 kg    Examination:  Constitutional: NAD Eyes: no scleral icterus ENMT: Mucous membranes are moist.  Neck: normal, supple Respiratory: clear to auscultation bilaterally, no wheezing, no crackles. Normal respiratory effort. No accessory muscle use.  Cardiovascular: Regular rate and rhythm, no murmurs / rubs / gallops.  Trace LE edema.  Abdomen: non distended, no tenderness. Bowel sounds positive.  Musculoskeletal: no clubbing / cyanosis.  Skin: no rashes Neurologic: CN 2-12 grossly intact. Strength 5/5 in all 4.  Psychiatric: Normal judgment and insight. Alert and oriented x 3. Normal mood.   Data Reviewed: I have independently reviewed following labs and imaging studies   CBC: Recent Labs  Lab 03/06/19 1900 03/07/19 1809 03/09/19 0739 03/10/19 0804 03/10/19 1747 03/11/19 0957 03/12/19 0528  WBC 14.6*   < > 17.9* 20.3* 17.3* 12.9* 16.4*  NEUTROABS 11.4*  --   --   --   --   --   --   HGB 12.5  --  8.1* 8.7* 7.9* 7.4* 7.4*  HCT 40.2  --  24.8* 27.1* 25.7* 24.1* 23.8*  MCV 100.0   < > 97.6 97.8 99.6 101.3* 100.8*  PLT 299   < > 186 229 244 247 285   < > = values in this interval not displayed.   Basic Metabolic Panel: Recent Labs  Lab 03/06/19 1900 03/07/19 1809 03/07/19 2201 03/08/19 0323 03/09/19 0002 03/10/19 0804 03/12/19 0528  NA 138 137  --  140 139 142 138  K 3.9 4.0  --  4.2 4.0 3.6 4.4  CL 97* 99  --  104 104 104 100  CO2 30  --   --  29 29 30 28   GLUCOSE 110* 156*  --  153* 147* 108* 150*  BUN 17 17  --  17 15 15 16   CREATININE 0.86 0.70 0.94 0.84 0.78 0.75 0.74  CALCIUM 8.7*  --   --  7.7* 7.9* 8.3* 8.2*    Liver Function Tests: Recent Labs  Lab 03/09/19 0739  AST 44*  ALT 19  ALKPHOS 39  BILITOT 1.1  PROT 5.1*  ALBUMIN 2.6*   Coagulation Profile: No results for input(s): INR, PROTIME in the last 168 hours. HbA1C: No results for input(s): HGBA1C in the last 72 hours. CBG: Recent Labs  Lab 03/11/19 0104 03/11/19 0748 03/11/19 1709 03/12/19 0044 03/12/19 1108  GLUCAP 132* 119* 223* 177* 154*    Recent Results (from the past 240 hour(s))  SARS CORONAVIRUS 2 (TAT 6-24 HRS) Nasopharyngeal Nasopharyngeal Swab     Status: None   Collection Time: 03/06/19  9:57 PM   Specimen: Nasopharyngeal Swab  Result Value Ref Range Status   SARS Coronavirus 2 NEGATIVE NEGATIVE Final    Comment: (NOTE) SARS-CoV-2 target nucleic acids are NOT DETECTED. The SARS-CoV-2 RNA is generally detectable in upper and lower  respiratory specimens during the acute phase of infection. Negative results do not preclude SARS-CoV-2 infection, do not rule out co-infections with other pathogens, and should not be used as the sole basis for treatment or other patient management decisions. Negative results must be combined with clinical observations, patient history, and epidemiological information. The expected result is Negative. Fact Sheet for Patients: SugarRoll.be Fact Sheet for Healthcare Providers: https://www.woods-mathews.com/ This test is not yet approved or cleared by the [place] FDA and  has been authorized for detection and/or diagnosis of SARS-CoV-2 by FDA under an Emergency Use Authorization (EUA). This EUA will remain  in effect (meaning this test can be used) for the duration of the COVID-19 declaration under Section 56 4(b)(1) of the Act, 21 U.S.C. section 360bbb-3(b)(1), unless the authorization is terminated or revoked sooner. Performed at [facility], [place] [address], [place], [place] [address]      Radiology Studies: No results  found.  Marzetta Board, MD, PhD [facility]  Between 7 am - 7 pm I am available, please contact me via Amion or Securechat  Between 7 pm - 7 am I am not available, please contact night coverage MD/APP via Amion

## 2019-03-13 ENCOUNTER — Other Ambulatory Visit: Payer: Self-pay | Admitting: Internal Medicine

## 2019-03-13 ENCOUNTER — Non-Acute Institutional Stay (SKILLED_NURSING_FACILITY): Payer: Medicare Other | Admitting: Internal Medicine

## 2019-03-13 ENCOUNTER — Encounter: Payer: Self-pay | Admitting: Internal Medicine

## 2019-03-13 DIAGNOSIS — Z8543 Personal history of malignant neoplasm of ovary: Secondary | ICD-10-CM | POA: Insufficient documentation

## 2019-03-13 DIAGNOSIS — D62 Acute posthemorrhagic anemia: Secondary | ICD-10-CM | POA: Diagnosis not present

## 2019-03-13 DIAGNOSIS — J42 Unspecified chronic bronchitis: Secondary | ICD-10-CM

## 2019-03-13 DIAGNOSIS — Z419 Encounter for procedure for purposes other than remedying health state, unspecified: Secondary | ICD-10-CM

## 2019-03-13 DIAGNOSIS — S7292XS Unspecified fracture of left femur, sequela: Secondary | ICD-10-CM

## 2019-03-13 DIAGNOSIS — Z9989 Dependence on other enabling machines and devices: Secondary | ICD-10-CM

## 2019-03-13 DIAGNOSIS — G4733 Obstructive sleep apnea (adult) (pediatric): Secondary | ICD-10-CM

## 2019-03-13 DIAGNOSIS — F39 Unspecified mood [affective] disorder: Secondary | ICD-10-CM

## 2019-03-13 DIAGNOSIS — G893 Neoplasm related pain (acute) (chronic): Secondary | ICD-10-CM | POA: Diagnosis not present

## 2019-03-13 DIAGNOSIS — Z8572 Personal history of non-Hodgkin lymphomas: Secondary | ICD-10-CM | POA: Insufficient documentation

## 2019-03-13 DIAGNOSIS — Z8579 Personal history of other malignant neoplasms of lymphoid, hematopoietic and related tissues: Secondary | ICD-10-CM | POA: Insufficient documentation

## 2019-03-13 DIAGNOSIS — Z7189 Other specified counseling: Secondary | ICD-10-CM

## 2019-03-13 DIAGNOSIS — Z9189 Other specified personal risk factors, not elsewhere classified: Secondary | ICD-10-CM

## 2019-03-13 LAB — COMPREHENSIVE METABOLIC PANEL
ALT: 41 U/L (ref 0–44)
AST: 47 U/L — ABNORMAL HIGH (ref 15–41)
Albumin: 2.6 g/dL — ABNORMAL LOW (ref 3.5–5.0)
Alkaline Phosphatase: 57 U/L (ref 38–126)
Anion gap: 10 (ref 5–15)
BUN: 15 mg/dL (ref 8–23)
CO2: 28 mmol/L (ref 22–32)
Calcium: 8.6 mg/dL — ABNORMAL LOW (ref 8.9–10.3)
Chloride: 101 mmol/L (ref 98–111)
Creatinine, Ser: 0.8 mg/dL (ref 0.44–1.00)
GFR calc Af Amer: >60 mL/min (ref >60)
GFR calc non Af Amer: >60 mL/min (ref >60)
Glucose, Bld: 124 mg/dL — ABNORMAL HIGH (ref 70–99)
Potassium: 4.5 mmol/L (ref 3.5–5.1)
Sodium: 139 mmol/L (ref 135–145)
Total Bilirubin: 1.2 mg/dL (ref 0.3–1.2)
Total Protein: 5.6 g/dL — ABNORMAL LOW (ref 6.5–8.1)

## 2019-03-13 LAB — TYPE AND SCREEN
ABO/RH(D): A POS
Antibody Screen: NEGATIVE
Unit division: 0

## 2019-03-13 LAB — BPAM RBC
Blood Product Expiration Date: 202102062359
ISSUE DATE / TIME: 202101131202
Unit Type and Rh: 6200

## 2019-03-13 LAB — SARS CORONAVIRUS 2 (TAT 6-24 HRS): SARS Coronavirus 2: NEGATIVE

## 2019-03-13 LAB — GLUCOSE, CAPILLARY
Glucose-Capillary: 136 mg/dL — ABNORMAL HIGH (ref 70–99)
Glucose-Capillary: 94 mg/dL (ref 70–99)

## 2019-03-13 LAB — CBC
HCT: 27.6 % — ABNORMAL LOW (ref 36.0–46.0)
Hemoglobin: 8.8 g/dL — ABNORMAL LOW (ref 12.0–15.0)
MCH: 31.1 pg (ref 26.0–34.0)
MCHC: 31.9 g/dL (ref 30.0–36.0)
MCV: 97.5 fL (ref 80.0–100.0)
Platelets: 353 10*3/uL (ref 150–400)
RBC: 2.83 MIL/uL — ABNORMAL LOW (ref 3.87–5.11)
RDW: 15.3 % (ref 11.5–15.5)
WBC: 14.6 10*3/uL — ABNORMAL HIGH (ref 4.0–10.5)
nRBC: 14.8 % — ABNORMAL HIGH (ref 0.0–0.2)

## 2019-03-13 LAB — PATHOLOGIST SMEAR REVIEW

## 2019-03-13 MED ORDER — BUPRENORPHINE 5 MCG/HR TD PTWK: 1.0000 | MEDICATED_PATCH | TRANSDERMAL | 0 refills | Status: DC

## 2019-03-13 MED ORDER — MIDODRINE HCL 10 MG PO TABS: 10.0000 mg | ORAL_TABLET | Freq: Three times a day (TID) | ORAL | 0 refills | Status: AC

## 2019-03-13 MED ORDER — BUPRENORPHINE 5 MCG/HR TD PTWK: 1.0000 | MEDICATED_PATCH | TRANSDERMAL | 0 refills | Status: AC

## 2019-03-13 MED ORDER — VITAMIN D 125 MCG (5000 UT) PO CAPS: 1.0000 | ORAL_CAPSULE | Freq: Every day | ORAL | 4 refills | Status: DC

## 2019-03-13 MED ORDER — VITAMIN C 500 MG PO CAPS: 500.0000 mg | ORAL_CAPSULE | Freq: Every day | ORAL | 2 refills | Status: DC

## 2019-03-13 MED ORDER — ENOXAPARIN SODIUM 40 MG/0.4ML ~~LOC~~ SOLN: 40.0000 mg | SUBCUTANEOUS | Status: AC

## 2019-03-13 MED ORDER — OXYCODONE HCL 5 MG PO TABS: 5.0000 mg | ORAL_TABLET | ORAL | 0 refills | Status: AC | PRN

## 2019-03-13 NOTE — Assessment & Plan Note (Signed)
CPAP @ SNF.  Risk of opioid related respiratory suppression with OSA discussed with patient

## 2019-03-13 NOTE — Progress Notes (Signed)
Attempt to give report no RN come to the phone, RN will call to get report per the receptionist .

## 2019-03-13 NOTE — Assessment & Plan Note (Signed)
Monitor for any additional bleeding on the Lovenox prophylaxis.

## 2019-03-13 NOTE — Assessment & Plan Note (Signed)
Weekly patch will be continued at the SNF.  Attempts will be made to reduce the as needed opioids to the lowest prehospitalization dose because of the significant risk of ADE.

## 2019-03-13 NOTE — TOC Transition Note (Signed)
Transition of Care [facility]) - CM/SW Discharge Note   Patient Details  Name: JOBY SEIFFERT MRN: [id number] Date of Birth: [birth year]  Transition of Care [facility]) CM/SW Contact:  Vinie Sill, [place] Phone Number: 03/13/2019, 12:28 PM   Clinical Narrative:     Patient will DC to: [facility]  DC Date: 03/13/2019 Family Notified: [name],daughter Transport [id number]  RN, patient, and facility notified of DC. Discharge Summary sent to facility. RN given number for [phone]. Ambulance transport requested for patient.   Clinical Social Worker signing off. Thurmond Butts, MSW, [place] Clinical Social Worker    Final next level of care: Skilled Nursing Facility Barriers to Discharge: Continued Medical Work up, SNF Pending bed offer   Patient Goals and CMS Choice Patient states their goals for this hospitalization and ongoing recovery are:: To be able to walk better CMS Medicare.gov Compare Post Acute Care list provided to:: Patient    Discharge Placement PASRR number recieved: 03/09/19            Patient chooses bed at: [facility] Patient to be transferred to facility by: [place] Name of family member notified: [name],daughter Patient and family notified of of transfer: 03/13/19  Discharge Plan and Services                                     Social Determinants of Health (SDOH) Interventions     Readmission Risk Interventions No flowsheet data found.

## 2019-03-13 NOTE — Patient Instructions (Signed)
See assessment and plan under each diagnosis in the problem list and acutely for this visit 

## 2019-03-13 NOTE — Assessment & Plan Note (Signed)
Continue duloxetine at SNF.  This would treat depression and also could have positive impact on chronic pain syndrome.

## 2019-03-13 NOTE — Progress Notes (Signed)
Report given to SNF RN all questions answered.

## 2019-03-13 NOTE — Progress Notes (Signed)
Patient c/o pain despite the pain med MD notified no new order given at this time .will continue to monitor the patient.

## 2019-03-13 NOTE — Progress Notes (Signed)
NURSING HOME LOCATION:  [facility]  ROOM NUMBER:  [id number]  CODE STATUS: Full code   PCP: Angeline Slim, MD  This is a comprehensive admission note to [facility] performed on this date less than 30 days from date of admission. Included are preadmission medical/surgical history; reconciled medication list; family history; social history and comprehensive review of systems.  Corrections and additions to the records were documented. Comprehensive physical exam was also performed. Additionally a clinical summary was entered for each active diagnosis pertinent to this admission in the Problem List to enhance continuity of care.  HPI: Patient was hospitalized 1/7-1/14/2021, admitted from home with increasing pain in the left hip over the prior week with acute worsening.  Although the patient denied any falls, CT imaging revealed left transverse proximal femur fracture.  Dr. Marcelino Scot performed intramedullary nailing of the left femur on 1/8.   Course was complicated by acute blood loss anemia; hemoglobin stabilized with transfusion of packed cells.   Chronic hypotension was treated with midodrine 10 mg 3 times daily with improvement in blood pressure. She also had a mild exacerbation of her COPD which is in the context of chronic hypoxic respiratory failure.  Active wheezing required an increase in her prednisone while hospitalized.  Prednisone dose was decreased to maintenance dose employed for autoimmune hemolytic anemia.  Leukocytosis was felt to be related to the chronic steroid administration.  She states she has been on prednisone since 1997. Postoperatively Lovenox for 30 days for DVT prophylaxis was initiated.  Past medical and surgical history: Includes chronic diastolic congestive heart failure, essential hypertension, history of lymphoma with autoimmune hemolytic anemia, history of ovarian cancer, sleep apnea with CPAP, GERD, mood disorder (she denies bipolar disorder or  schizophrenia), and chronic pain on maintenance buprenorphine.  She states that she had a cervical fracture related to being on high doses of gabapentin. Surgeries include hysterectomy, cholecystectomy, and splenectomy.  Social history: Nondrinker; quit smoking 2005.  She states she never smoked more than 2 cigarettes a day and often did not smoke at all.  Family history: Noncontributory due to advanced age.  Her son died of infection in the setting of immune compromise after receiving renal transplant from his brother.   Review of systems: She states that she did not fall but the leg simply fractured.  This is in the context of having been on Fosamax for over 10 years.  She stated she had to drag her foot for at least 3-4 days prior to going to the ED.  Acute exacerbation of the pain prompted that visit.   She states that she has had severe pain over the last 3 days which has prevented sleep.  She also describes neuropathic pain in her legs and feet.  She has restless leg syndrome as well. She states that she did have acute kidney impairment while she was in another nursing home.  Creatinine while hospitalized was normal, but protein and albumin were low.  She describes intermittent dysphagia with pills stating that sometimes have to be crushed.  She also describes a nonproductive cough.  She describes occasional chest pain which she was told was related to her COPD. She states that she gets constipation from opioids.  Constitutional: No fever, significant weight change Eyes: No redness, discharge, pain, vision change ENT/mouth: No nasal congestion, purulent discharge, earache, change in hearing, sore throat  Cardiovascular: No palpitations, paroxysmal nocturnal dyspnea, edema  Respiratory: No sputum production, hemoptysis Gastrointestinal: No heartburn, abdominal pain, nausea /vomiting, rectal  bleeding, melena Genitourinary: No dysuria, hematuria, pyuria, incontinence, nocturia Dermatologic: No  rash, pruritus, change in appearance of skin Neurologic: No  syncope, seizures Endocrine: No change in hair/skin/nails, excessive thirst, excessive hunger, excessive urination  Hematologic/lymphatic: No significant  lymphadenopathy, abnormal bleeding Allergy/immunology: No itchy/watery eyes, significant sneezing, urticaria, angioedema  Physical exam:  Pertinent or positive findings: She is morbidly obese.  She has weathered facies and appears her age.  The right pupil is distorted surgically.  She is wearing nasal oxygen.  There is a subtle deformity of the bridge of the nose.  Complete dentures present.  Tongue is dry.  Breath sounds are decreased especially in the lower lobes.  Heart sounds are distant.  Flow murmur is suggested.  Abdomen is markedly protuberant.  Dorsalis pedis pulses are stronger than the posterior tibial pulses.  She has clubbing of the nailbeds.  Varicosities are present at the ankles greater on the right than the left.  The left knee exhibits fusiform changes.  The operative wound at the left lateral thigh is dressed.  She has scattered bruising over the forearms.  Left great toenail is thickened and deformed.  She exhibits intermittent tremor of the right foot.  General appearance: no acute distress, increased work of breathing is present.   Lymphatic: No lymphadenopathy about the head, neck, axilla. Eyes: No conjunctival inflammation or lid edema is present. There is no scleral icterus. Ears:  External ear exam shows no significant lesions or deformities.   Nose:   Nasal mucosa  without lesions, exudates Oral exam: Lips and gums are healthy appearing. Neck:  No thyromegaly, masses, tenderness noted.    Heart:  No gallop,  click, rub.  Lungs:  without wheezes, rhonchi, rales, rubs. Abdomen: Bowel sounds are normal.  Abdomen is soft and nontender with no organomegaly, hernias, masses. GU: Deferred  Extremities:  No cyanosis,  edema. Neurologic exam:  Balance, Rhomberg,  finger to nose testing could not be completed due to clinical state Skin: Warm & dry w/o tenting. No significant lesions or rash.  See clinical summary under each active problem in the Problem List with associated updated therapeutic plan

## 2019-03-13 NOTE — Discharge Instructions (Signed)
Orthopaedic Trauma Service Discharge Instructions   General Discharge Instructions  Orthopaedic Injuries:  Atypical left femur fracture (bisphosphonate associated) treated with intramedullary nailing   WEIGHT BEARING STATUS: Weightbearing as tolerated left leg   RANGE OF MOTION/ACTIVITY: unrestricted range of motion left hip and knee. Activity as tolerated, ambulate with assistance   Bone health:  Stop bisphosphonates (fosamax) indefinitely   Wound Care:  Daily wound care starting on 03/14/2019. See below  Discharge Wound Care Instructions  Do NOT apply any ointments, solutions or lotions to pin sites or surgical wounds.  These prevent needed drainage and even though solutions like hydrogen peroxide kill bacteria, they also damage cells lining the pin sites that help fight infection.  Applying lotions or ointments can keep the wounds moist and can cause them to breakdown and open up as well. This can increase the risk for infection. When in doubt call the office.  Surgical incisions should be dressed daily.  If any drainage is noted, use one layer of adaptic, then gauze and tape. Alternatively you can use a mepilex type dressing   Once the incision is completely dry and without drainage, it may be left open to air out.  Showering may begin 36-48 hours later.  Cleaning gently with soap and water.   DVT/PE prophylaxis: Lovenox 40 mg injection daily x 28 days   Diet: as you were eating previously.  Can use over the counter stool softeners and bowel preparations, such as Miralax, to help with bowel movements.  Narcotics can be constipating.  Be sure to drink plenty of fluids  PAIN MEDICATION USE AND EXPECTATIONS  You have likely been given narcotic medications to help control your pain.  After a traumatic event that results in an fracture (broken bone) with or without surgery, it is ok to use narcotic pain medications to help control one's pain.  We understand that everyone responds to  pain differently and each individual patient will be evaluated on a regular basis for the continued need for narcotic medications. Ideally, narcotic medication use should last no more than 6-8 weeks (coinciding with fracture healing).   As a patient it is your responsibility as well to monitor narcotic medication use and report the amount and frequency you use these medications when you come to your office visit.   We would also advise that if you are using narcotic medications, you should take a dose prior to therapy to maximize you participation.  IF YOU ARE ON NARCOTIC MEDICATIONS IT IS NOT PERMISSIBLE TO OPERATE A MOTOR VEHICLE (MOTORCYCLE/CAR/TRUCK/MOPED) OR HEAVY MACHINERY DO NOT MIX NARCOTICS WITH OTHER CNS (CENTRAL NERVOUS SYSTEM) DEPRESSANTS SUCH AS ALCOHOL   STOP SMOKING OR USING NICOTINE PRODUCTS!!!!  As discussed nicotine severely impairs your body's ability to heal surgical and traumatic wounds but also impairs bone healing.  Wounds and bone heal by forming microscopic blood vessels (angiogenesis) and nicotine is a vasoconstrictor (essentially, shrinks blood vessels).  Therefore, if vasoconstriction occurs to these microscopic blood vessels they essentially disappear and are unable to deliver necessary nutrients to the healing tissue.  This is one modifiable factor that you can do to dramatically increase your chances of healing your injury.    (This means no smoking, no nicotine gum, patches, etc)  DO NOT USE NONSTEROIDAL ANTI-INFLAMMATORY DRUGS (NSAID'S)  Using products such as Advil (ibuprofen), Aleve (naproxen), Motrin (ibuprofen) for additional pain control during fracture healing can delay and/or prevent the healing response.  If you would like to take over the counter (  OTC) medication, Tylenol (acetaminophen) is ok.  However, some narcotic medications that are given for pain control contain acetaminophen as well. Therefore, you should not exceed more than 4000 mg of tylenol in a day  if you do not have liver disease.  Also note that there are may OTC medicines, such as cold medicines and allergy medicines that my contain tylenol as well.  If you have any questions about medications and/or interactions please ask your doctor/PA or your pharmacist.      ICE AND ELEVATE INJURED/OPERATIVE EXTREMITY  Using ice and elevating the injured extremity above your heart can help with swelling and pain control.  Icing in a pulsatile fashion, such as 20 minutes on and 20 minutes off, can be followed.    Do not place ice directly on skin. Make sure there is a barrier between to skin and the ice pack.    Using frozen items such as frozen peas works well as the conform nicely to the are that needs to be iced.  USE AN ACE WRAP OR TED HOSE FOR SWELLING CONTROL  In addition to icing and elevation, Ace wraps or TED hose are used to help limit and resolve swelling.  It is recommended to use Ace wraps or TED hose until you are informed to stop.    When using Ace Wraps start the wrapping distally (farthest away from the body) and wrap proximally (closer to the body)   Example: If you had surgery on your leg or thing and you do not have a splint on, start the ace wrap at the toes and work your way up to the thigh        If you had surgery on your upper extremity and do not have a splint on, start the ace wrap at your fingers and work your way up to the upper arm  IF YOU ARE IN A SPLINT OR CAST DO NOT Ritchey   If your splint gets wet for any reason please contact the office immediately. You may shower in your splint or cast as long as you keep it dry.  This can be done by wrapping in a cast cover or garbage back (or similar)  Do Not stick any thing down your splint or cast such as pencils, money, or hangers to try and scratch yourself with.  If you feel itchy take benadryl as prescribed on the bottle for itching  IF YOU ARE IN A CAM BOOT (BLACK BOOT)  You may remove boot periodically.  Perform daily dressing changes as noted below.  Wash the liner of the boot regularly and wear a sock when wearing the boot. It is recommended that you sleep in the boot until told otherwise    Call office for the following:  Temperature greater than 101F  Persistent nausea and vomiting  Severe uncontrolled pain  Redness, tenderness, or signs of infection (pain, swelling, redness, odor or green/yellow discharge around the site)  Difficulty breathing, headache or visual disturbances  Hives  Persistent dizziness or light-headedness  Extreme fatigue  Any other questions or concerns you may have after discharge  In an emergency, call 911 or go to an Emergency Department at a nearby hospital    [place]: [phone]   VISIT OUR WEBSITE FOR ADDITIONAL INFORMATION: [email or web address]

## 2019-03-13 NOTE — Assessment & Plan Note (Addendum)
Follow-up with orthopedist roughly 2 weeks post discharge. PT/OT at SNF.

## 2019-03-13 NOTE — Progress Notes (Signed)
Orthopaedic Trauma Service Progress Note  Patient ID: Virginia Thompson MRN: [id number] DOB/AGE: [birth year] 86 y.o.  Subjective:  Doing ok  No acute issues  Should be going to SNF today    ROS As above  Objective:   VITALS:   Vitals:   03/12/19 2227 03/13/19 0223 03/13/19 0410 03/13/19 0918  BP: (!) 102/55 134/65 (!) 122/59   Pulse: 79 97 75   Resp:   18   Temp:   97.6 F (36.4 C)   TempSrc:   Oral   SpO2:   100% 100%  Weight:   100.3 kg     Estimated body mass index is 40.44 kg/m as calculated from the following:   Height as of 04/08/16: 5\' 2"  (1.575 m).   Weight as of this encounter: 100.3 kg.   Intake/Output      01/13 0701 - 01/14 0700 01/14 0701 - 01/15 0700   P.O. 780 360   Blood 315    Total Intake(mL/kg) 1095 (10.9) 360 (3.6)   Urine (mL/kg/hr) 1650 (0.7) 700 (1.9)   Total Output 1650 700   Net -555 -340        Urine Occurrence 2 x      LABS  Results for orders placed or performed during the hospital encounter of 03/06/19 (from the past 24 hour(s))  Glucose, capillary     Status: Abnormal   Collection Time: 03/12/19 11:08 AM  Result Value Ref Range   Glucose-Capillary 154 (H) 70 - 99 mg/dL  SARS CORONAVIRUS 2 (TAT 6-24 HRS) Nasopharyngeal Nasopharyngeal Swab     Status: None   Collection Time: 03/12/19 12:00 PM   Specimen: Nasopharyngeal Swab  Result Value Ref Range   SARS Coronavirus 2 NEGATIVE NEGATIVE  Glucose, capillary     Status: Abnormal   Collection Time: 03/12/19  4:27 PM  Result Value Ref Range   Glucose-Capillary 158 (H) 70 - 99 mg/dL  Glucose, capillary     Status: Abnormal   Collection Time: 03/13/19 12:09 AM  Result Value Ref Range   Glucose-Capillary 136 (H) 70 - 99 mg/dL   Comment 1 Notify RN    Comment 2 Document in Chart   Daily CBC     Status: Abnormal   Collection Time: 03/13/19  3:52 AM  Result Value Ref Range   WBC 14.6 (H) 4.0 - 10.5 K/uL   RBC 2.83 (L) 3.87 - 5.11 MIL/uL   Hemoglobin 8.8 (L) 12.0 - 15.0 g/dL   HCT 27.6 (L) 36.0 - 46.0 %   MCV 97.5 80.0 - 100.0 fL   MCH 31.1 26.0 - 34.0 pg   MCHC 31.9 30.0 - 36.0 g/dL   RDW 15.3 11.5 - 15.5 %   Platelets 353 150 - 400 K/uL   nRBC 14.8 (H) 0.0 - 0.2 %  Comprehensive metabolic panel tomorrow     Status: Abnormal   Collection Time: 03/13/19  3:52 AM  Result Value Ref Range   Sodium 139 135 - 145 mmol/L   Potassium 4.5 3.5 - 5.1 mmol/L   Chloride 101 98 - 111 mmol/L   CO2 28 22 - 32 mmol/L   Glucose, Bld 124 (H) 70 - 99 mg/dL   BUN 15 8 - 23 mg/dL   Creatinine, Ser 0.80 0.44 - 1.00 mg/dL   Calcium 8.6 (  L) 8.9 - 10.3 mg/dL   Total Protein 5.6 (L) 6.5 - 8.1 g/dL   Albumin 2.6 (L) 3.5 - 5.0 g/dL   AST 47 (H) 15 - 41 U/L   ALT 41 0 - 44 U/L   Alkaline Phosphatase 57 38 - 126 U/L   Total Bilirubin 1.2 0.3 - 1.2 mg/dL   GFR calc non Af Amer >60 >60 mL/min   GFR calc Af Amer >60 >60 mL/min   Anion gap 10 5 - 15  Glucose, capillary     Status: None   Collection Time: 03/13/19  7:15 AM  Result Value Ref Range   Glucose-Capillary 94 70 - 99 mg/dL   Results for Virginia Thompson, Virginia Thompson (MRN [id number]) as of 03/13/2019 10:48  Ref. Range 03/11/2019 09:57  Vitamin D, 25-Hydroxy Latest Ref Range: 30 - 100 ng/mL 26.18 (L)    PHYSICAL EXAM:   Gen: in bed, NAD Ext:       Left Lower Extremity   Dressings L thigh C/D/I  Ext warm  Swelling controlled  Baseline neuropathy seems unchanged  EHL, FHL, lesser toe motor intact  AT, PT, peroneals, gastroc motor intact  No DCT   Compartments soft   Assessment/Plan: 6 Days Post-Op   Principal Problem:   Closed left hip fracture, initial encounter [facility]) Active Problems:   Diastolic CHF, chronic (HCC)   Autoimmune hemolytic anemia   Fracture   Surgery, elective   Chronic bronchitis ([place])   Anti-infectives (From admission, onward)   Start     Dose/Rate Route Frequency Ordered Stop   03/11/19 1030  azithromycin (ZITHROMAX) tablet 500  mg     500 mg Oral Daily 03/11/19 0937 03/13/19 0846   03/08/19 0600  ceFAZolin (ANCEF) IVPB 2g/100 mL premix     2 g 200 mL/hr over 30 Minutes Intravenous On call to O.R. 03/07/19 1205 03/08/19 0720   03/07/19 2200  ceFAZolin (ANCEF) IVPB 1 g/50 mL premix     1 g 100 mL/hr over 30 Minutes Intravenous Every 6 hours 03/07/19 2059 03/08/19 1559   03/07/19 1300  ceFAZolin (ANCEF) IVPB 2g/100 mL premix     2 g 200 mL/hr over 30 Minutes Intravenous On call to O.R. 03/07/19 [id number] 03/08/19 0559    .  POD/HD#: 48  86 y/o female with atraumatic fracture L femur    -bisphosphonate associated L subtrochanteric femur fracture s/p ORIF and IMN             Fracture pattern very characteristic of what is seen is with prolonged bisphosphonate use.  This is important to identify because they typically have a protracted healing course and may require subsequent intervention in several months.  Will continue to monitor closely .  Will try to arrange for low intensity pulsed ultrasound (LIPUS) bone growth stimulator to assist in the healing process                WBAT L lower extremity with assistance             ROM as tolerated L hip and knee                          daily dressing changes as needed              Ok to shower and clean wounds with soap and water             Ice and elevate  PT/OT               Dc bisphosphonates indefinitely                 - Pain management:             Chronic pain                          Continue with current regimen    - ABL anemia/Hemodynamics             stable  - Medical issues              Per medical team    - DVT/PE prophylaxis:             lovenox x 30 days for DVT/PE prophylaxis    - ID:              periop abx completed   - Metabolic Bone Disease:             vitamin d insufficiency    Supplement              Will need DEXA as outpt              Explore additional agents to treat metabolic bone disease   - Activity:              Up with assistance             WBAT L lex    - FEN/GI prophylaxis/Foley/Lines:             Heart healthy diet   - Impediments to fracture healing:             Prolonged bisphosphonate use              COPD, CHF              Chronic opioid use              Chronic prednisone use               - Dispo:             dc to snf today   Follow up with ortho in 10-14 days for suture removal and xrays    Jari Pigg, PA-C [phone] (C) 03/13/2019, 10:46 AM  [facility] [place] [phone] Jenetta [phone] (F)   After 6pm on weekdays please call office number to get in touch with on call provider or refer to Amion and look to see who is on call for the Sports Medicine Call Group which is listed under orthopaedics   On Weekends please call office number to get in touch with on call provider or refer to Amion and look to see who is on call for the Sports Medicine Call Group which is listed under orthopaedics

## 2019-03-13 NOTE — Discharge Planning (Deleted)
Physician Discharge Summary  Virginia Thompson [id number] DOB: [birth year] DOA: 03/06/2019  PCP: Angeline Slim, MD  Admit date: 03/06/2019 Discharge date: 03/13/2019  Admitted From: home Disposition:  SNF  Recommendations for Outpatient Follow-up:  1. Follow up with PCP in 1-2 weeks 2. Follow up with Dr. Marcelino Scot in 2 weeks 3. DVT prophylaxis per orthoprdic surgery Lovenox s.q for 30 days  Home Health: none Equipment/Devices: none  Discharge Condition: stable CODE STATUS: Full code Diet recommendation: regular  HPI: Per admitting MD, Virginia Thompson is a 86 y.o. 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  Patient denies any fall.  Denies any fever chills chest pain or shortness of breath. ED Course: In the ER patient had CT lumbar and CT renal study and x-rays which showed left transverse proximal femur fracture for which ER physician had discussed with on-call orthopedic surgeon Dr. Lyla Glassing will be planning to do surgery in the morning at [facility] test came back negative.  Labs show mild leukocytosis.  EKG shows normal sinus rhythm with RBBB.  Hospital Course / Discharge diagnoses: Principal Problem Left hip fracture -Orthopedic surgery consulted, status post intramedullary nailing of the left femur on 1/8. DVT prophylaxis and pain control per orthopedic surgery's, WBAT. Lovenox for 30 days for DVT prophylaxis. FOllow up with Dr. Marcelino Scot in 2 weeks.   Active Problems Chronic diastolic CHF -Currently appears euvolemic, resume home medications Acute blood loss anemia -Likely in the setting of #1, received transfusion and Hb now stabilized.  Chronic hypotension -Patient was started on midodrine 10 mg 3 times daily, blood pressure  improved. Lactic acid is normal, no evidence of infection, he has a history of hypotension in the past COPD with mild exacerbation with underlying chronic hypoxic respiratory failure -He is on chronic 2 L nasal cannula after pneumonia status post chemotherapy. Had mild wheezing requiring increase in her prednisone while hospitalized, respiratory status is at baseline, no further wheezing, continue prednisone at home regimen.  History of autoimmune hemolytic anemia -Continue prednisone, her transfusion requirements are likely due to acute blood loss anemia Leukocytosis -Likely due to steroids, afebrile, no source of infection Chronic bronchitis -continue home medications Mood disorder -On Cymbalta Chronic pain -Continue buprenorphine Sleep apnea -CPAP qhs GERD -On Nexium  Discharge Instructions   Allergies as of 03/13/2019      Reactions   Aleve [naproxen] Rash   Mobic [meloxicam] Rash   Tape Rash      Medication List    STOP taking these medications   cephALEXin 250 MG capsule Commonly known as: KEFLEX     TAKE these medications   acetaminophen 500 MG tablet Commonly known as: TYLENOL Take by mouth.   aspirin EC 81 MG tablet Take 81 mg by mouth daily.   buprenorphine 5 MCG/HR Ptwk Commonly known as: BUTRANS Place 1 patch onto the skin once a week.   docusate sodium 100 MG capsule Commonly known as: COLACE Take 200 mg by mouth daily.   DULoxetine 60 MG capsule Commonly known as: CYMBALTA Take 60 mg by mouth daily.   enoxaparin 40 MG/0.4ML injection Commonly known as: LOVENOX Inject 0.4 mLs (40 mg total) into the skin daily.   esomeprazole 40 MG capsule Commonly known as: NEXIUM Take 40 mg by mouth daily.  folic acid 1 MG tablet Commonly known as: FOLVITE Take 1 mg by mouth 3 (three) times daily.   furosemide 40 MG tablet Commonly known as: LASIX Take 60 mg by mouth daily.   ICAPS AREDS 2 PO Take 2 tablets by mouth daily.   loratadine 10 MG  tablet Commonly known as: CLARITIN Take 10 mg by mouth daily.   metoprolol tartrate 25 MG tablet Commonly known as: LOPRESSOR Take 12.5 mg by mouth 2 (two) times daily.   midodrine 10 MG tablet Commonly known as: PROAMATINE Take 1 tablet (10 mg total) by mouth 3 (three) times daily with meals.   oxyCODONE 5 MG immediate release tablet Commonly known as: Oxy IR/ROXICODONE Take 1 tablet (5 mg total) by mouth every 4 (four) hours as needed for up to 5 days.   pramipexole 1 MG tablet Commonly known as: MIRAPEX Take 1 mg by mouth at bedtime.   predniSONE 5 MG tablet Commonly known as: DELTASONE Take 5 mg by mouth daily.   ProAir HFA 108 (90 Base) MCG/ACT inhaler Generic drug: albuterol Inhale 2 puffs into the lungs every 6 (six) hours as needed for wheezing or shortness of breath.   Spiriva HandiHaler 18 MCG inhalation capsule Generic drug: tiotropium Place 18 mcg into inhaler and inhale daily.   sulfamethoxazole-trimethoprim 800-160 MG tablet Commonly known as: BACTRIM DS TAKE ONE TABLET BY MOUTH EVERY MONDAY, WEDNESDAY, AND FRIDAY       Contact information for follow-up providers    Altamese Murray Hill, MD. Schedule an appointment as soon as possible for a visit in 2 week(s).   Specialty: Orthopedic Surgery Contact information: [place] [address] [phone]            Contact information for after-discharge care    Destination    [facility] .   Service: Skilled Nursing Contact information: [id number] [place] [phone]                  Consultations:  Orthopedic surgery   Procedures/Studies:  IM nail   DG Pelvis Portable  Result Date: 03/07/2019 CLINICAL DATA:  Status post left femur surgery EXAM: PORTABLE PELVIS 1-2 VIEWS COMPARISON:  None. FINDINGS: Pelvic ring is intact. Postsurgical changes are noted in the left femur. Previously seen femoral shaft fracture is  again noted. Vascular calcifications are seen. IMPRESSION: Status post ORIF of left femoral fracture. Electronically Signed   By: Inez Catalina M.D.   On: 03/07/2019 20:03   CT L-SPINE NO CHARGE  Result Date: 03/06/2019 CLINICAL DATA:  Unable to walk.  Low back pain. EXAM: CT LUMBAR SPINE WITHOUT CONTRAST TECHNIQUE: Multidetector CT imaging of the lumbar spine was performed without intravenous contrast administration. Multiplanar CT image reconstructions were also generated. COMPARISON:  None. FINDINGS: Segmentation: 5 lumbar type vertebral bodies. Alignment: Mild curvature convex to the left with the apex at L2-3. Vertebrae: Old minor superior endplate compression deformity on the left at L4. Old minor superior endplate compression deformity more on the right at L2. No evidence of recent fracture. No focal bone lesion. Paraspinal and other soft tissues: Nonobstructing stone in the lower pole the right kidney. Aortic atherosclerosis. Disc levels: T12-L1: Minimal disc bulge.  No stenosis. L1-2: Disc degeneration with loss of disc height. Endplate osteophytes and mild bulging of the disc. No compressive stenosis. L2-3: Minimal disc bulge. Minimal facet hypertrophy. No compressive stenosis. L3-4: Mild disc bulge. Mild facet hypertrophy. Mild narrowing of the lateral  recesses but no definite compressive stenosis. L4-5: Moderate disc bulge more prominent towards the left. Facet and ligamentous hypertrophy. Stenosis of both lateral recesses left more than right. Neural compression could occur at this level, particularly on the left. L5-S1: Distant left hemilaminectomy. Disc space narrowing. Endplate osteophytes and mild annular bulging. No central canal stenosis. Mild bony foraminal narrowing on the left. Sacroiliac joints are unremarkable considering age. IMPRESSION: No evidence of recent fracture. Old minor superior endplate depression on the left at L4. Old minor superior endplate depression at L2, more on the right.  No advanced spinal stenosis. Multilevel degenerative disc disease and degenerative facet disease, typical for age. At L4-5, there is bilateral lateral recess narrowing left more than right. Neural compression could possibly occur at this level, particularly on the left. At L5-S1, there has been a distant left hemilaminectomy. There is mild bony foraminal stenosis on the left at this level, not definitely compressive. Electronically Signed   By: Nelson Chimes M.D.   On: 03/06/2019 19:36   DG CHEST PORT 1 VIEW  Result Date: 03/10/2019 CLINICAL DATA:  Tachypnea.  Shortness of breath. EXAM: PORTABLE CHEST 1 VIEW COMPARISON:  06/17/2006 FINDINGS: Cardiomegaly. Chronic aortic atherosclerosis. Chronic interstitial lung markings. Possible venous hypertension with mild interstitial edema. No consolidation, collapse or visible effusion. IMPRESSION: Cardiomegaly. Aortic atherosclerosis. Chronic lung disease. Possible venous hypertension with mild interstitial edema. Electronically Signed   By: Nelson Chimes M.D.   On: 03/10/2019 10:02   DG Knee Complete 4 Views Left  Result Date: 03/06/2019 CLINICAL DATA:  Recent fall with left knee pain, initial encounter EXAM: LEFT KNEE - COMPLETE 4+ VIEW COMPARISON:  None. FINDINGS: Partial knee replacement is noted laterally. Changes of prior fibular fracture are noted. Degenerative changes are noted in all 3 joint compartments. No joint effusion is seen. No acute fracture is seen. IMPRESSION: Chronic changes without acute abnormality. Electronically Signed   By: Inez Catalina M.D.   On: 03/06/2019 21:14   DG C-Arm 1-60 Min  Result Date: 03/07/2019 CLINICAL DATA:  Femur fracture EXAM: DG C-ARM 1-60 MIN CONTRAST:  None FLUOROSCOPY TIME:  Fluoroscopy Time:  3 minutes 40 seconds Number of Acquired Spot Images: 13 COMPARISON:  03/06/2019 FINDINGS: Thirteen low resolution intraoperative spot views of the left femur. Displaced proximal femoral shaft fracture. Evidence of prior  hemiarthroplasty at the knee. Subsequent images demonstrate multiple plate and screw fixation of the proximal femoral fracture. Insertion of intramedullary rod and distal fixating screws with anatomic alignment. Fixating screw placed across the left femoral neck and head. IMPRESSION: Intraoperative fluoroscopic assistance provided during surgical fixation of proximal left femur fracture Electronically Signed   By: Donavan Foil M.D.   On: 03/07/2019 19:03   ECHOCARDIOGRAM COMPLETE  Result Date: 03/08/2019   ECHOCARDIOGRAM REPORT   Patient Name:   Virginia Thompson Date of Exam: 03/08/2019 Medical Rec #:  [id number]      Height:       62.0 in Accession #:    [id number]     Weight:       217.6 lb Date of Birth:  04-May-1928     BSA:          1.98 m Patient Age:    86 years       BP:           68/57 mmHg Patient Gender: F              HR:  95 bpm. Exam Location:  Inpatient Procedure: 2D Echo Indications:    Aortic Stenosis 424.1/135.0  History:        Patient has prior history of Echocardiogram examinations, most                 recent 10/06/2004. CHF; Risk Factors:Hypertension.  Sonographer:    Clayton Lefort RDCS (AE) Referring Phys: [phone] RALPH A NETTEY  Sonographer Comments: Limited patient mobility. IMPRESSIONS  1. Left ventricular ejection fraction, by visual estimation, is 60 to 65%. The left ventricle has normal function. There is moderately increased left ventricular hypertrophy.  2. Elevated left atrial pressure.  3. Global right ventricle has normal systolic function.The right ventricular size is normal.  4. Left atrial size was severely dilated.  5. Right atrial size was normal.  6. Moderate mitral annular calcification.  7. The mitral valve is normal in structure. No evidence of mitral valve regurgitation. No evidence of mitral stenosis.  8. The tricuspid valve is normal in structure.  9. The aortic valve has an indeterminant number of cusps. Aortic valve regurgitation is not visualized. Moderate aortic  valve stenosis. 10. The pulmonic valve was not well visualized. Pulmonic valve regurgitation is not visualized. 11. The left ventricle has no regional wall motion abnormalities. FINDINGS  Left Ventricle: Left ventricular ejection fraction, by visual estimation, is 60 to 65%. The left ventricle has normal function. The left ventricle has no regional wall motion abnormalities. There is moderately increased left ventricular hypertrophy. Elevated left atrial pressure. Right Ventricle: The right ventricular size is normal.Global RV systolic function is has normal systolic function. Left Atrium: Left atrial size was severely dilated. Right Atrium: Right atrial size was normal in size Pericardium: There is no evidence of pericardial effusion. Mitral Valve: The mitral valve is normal in structure. Moderate mitral annular calcification. No evidence of mitral valve regurgitation. No evidence of mitral valve stenosis by observation. MV peak gradient, 19.2 mmHg. Tricuspid Valve: The tricuspid valve is normal in structure. Tricuspid valve regurgitation is trivial. Aortic Valve: The aortic valve has an indeterminant number of cusps. Aortic valve regurgitation is not visualized. Moderate aortic stenosis is present. Aortic valve mean gradient measures 20.0 mmHg. Aortic valve peak gradient measures 32.5 mmHg. Aortic valve area, by VTI measures 1.13 cm. Pulmonic Valve: The pulmonic valve was not well visualized. Pulmonic valve regurgitation is not visualized. Pulmonic regurgitation is not visualized. Aorta: The aortic root is normal in size and structure. Venous: The inferior vena cava was not well visualized. IAS/Shunts: No atrial level shunt detected by color flow Doppler. Additional Comments: Normal LV systolic function; moderate LVH; moderate AS (mean gradient 21 mmHg); mean gradient across MV elevated (11 mmHg) but no MS by pressure half time; severe LAE.  LEFT VENTRICLE PLAX 2D LVIDd:         3.40 cm  Diastology LVIDs:          3.28 cm  LV e' lateral:   5.44 cm/s LV PW:         1.77 cm  LV E/e' lateral: 31.6 LV IVS:        1.72 cm  LV e' medial:    7.51 cm/s LVOT diam:     1.80 cm  LV E/e' medial:  22.9 LV SV:         4 ml LV SV Index:   1.85 LVOT Area:     2.54 cm  RIGHT VENTRICLE RV Basal diam:  2.79 cm RV S prime:  13.90 cm/s TAPSE (M-mode): 2.1 cm LEFT ATRIUM              Index       RIGHT ATRIUM           Index LA diam:        4.40 cm  2.22 cm/m  RA Area:     11.50 cm LA Vol (A2C):   109.0 ml 55.01 ml/m RA Volume:   20.80 ml  10.50 ml/m LA Vol (A4C):   81.6 ml  41.18 ml/m LA Biplane Vol: 94.7 ml  47.79 ml/m  AORTIC VALVE AV Area (Vmax):    1.26 cm AV Area (Vmean):   1.14 cm AV Area (VTI):     1.13 cm AV Vmax:           285.00 cm/s AV Vmean:          214.000 cm/s AV VTI:            0.607 m AV Peak Grad:      32.5 mmHg AV Mean Grad:      20.0 mmHg LVOT Vmax:         141.50 cm/s LVOT Vmean:        95.450 cm/s LVOT VTI:          0.270 m LVOT/AV VTI ratio: 0.45  AORTA Ao Root diam: 2.80 cm MITRAL VALVE                         TRICUSPID VALVE MV Area (PHT): 3.19 cm              TR Peak grad:   15.8 mmHg MV Peak grad:  19.2 mmHg             TR Vmax:        266.00 cm/s MV Mean grad:  11.0 mmHg MV Vmax:       2.19 m/s              SHUNTS MV Vmean:      154.0 cm/s            Systemic VTI:  0.27 m MV VTI:        0.43 m                Systemic Diam: 1.80 cm MV PHT:        69.02 msec MV Decel Time: 238 msec MV E velocity: 172.00 cm/s 103 cm/s MV A velocity: 185.00 cm/s 70.3 cm/s MV E/A ratio:  0.93        1.5  Kirk Ruths MD Electronically signed by Kirk Ruths MD Signature Date/Time: 03/08/2019/2:43:58 PM    Final    CT Renal Stone Study  Addendum Date: 03/06/2019   ADDENDUM REPORT: 03/06/2019 20:07 ADDENDUM: Upon further evaluation, an acute displaced fracture deformity of the proximal left femur is seen. This involves the proximal left femoral shaft, just distal to the inter trochanteric region. Approximately 1/2 shaft  width medial displacement of the distal fracture site is noted. Electronically Signed   By: Virgina Norfolk M.D.   On: 03/06/2019 20:07   Result Date: 03/06/2019 CLINICAL DATA:  Flank pain. EXAM: CT ABDOMEN AND PELVIS WITHOUT CONTRAST TECHNIQUE: Multidetector CT imaging of the abdomen and pelvis was performed following the standard protocol without IV contrast. COMPARISON:  None. FINDINGS: Lower chest: Mild atelectasis is seen within the bilateral lung bases. Marked severity coronary artery calcification is seen. Hepatobiliary: Numerous punctate calcified  granulomas are seen scattered throughout the liver. The gallbladder is not identified. Pancreas: Unremarkable. No pancreatic ductal dilatation or surrounding inflammatory changes. Spleen: The spleen is absent. Adrenals/Urinary Tract: Adrenal glands are unremarkable. Kidneys are normal, without focal lesions or hydronephrosis. A 7 mm nonobstructing renal stone is seen within the mid right kidney. Bladder is unremarkable. Stomach/Bowel: Stomach is within normal limits. The appendix is not identified. No evidence of bowel wall thickening, distention, or inflammatory changes. Noninflamed diverticula are seen throughout the sigmoid colon. Vascular/Lymphatic: Marked severity aortic atherosclerosis. No enlarged abdominal or pelvic lymph nodes. Reproductive: Status post hysterectomy. No adnexal masses. Other: A surgical scar is seen along the midline of the anterior abdominal wall Musculoskeletal: Multilevel degenerative changes seen throughout the lumbar spine. IMPRESSION: 1. 7 mm nonobstructing renal stone within the right kidney. 2. Absent spleen. 3. Sigmoid diverticulosis. Electronically Signed: By: Virgina Norfolk M.D. On: 03/06/2019 19:49   DG Hip Unilat W or Wo Pelvis 2-3 Views Left  Result Date: 03/06/2019 CLINICAL DATA:  Recent fall with left hip pain, initial encounter EXAM: DG HIP (WITH OR WITHOUT PELVIS) 2-3V LEFT COMPARISON:  None. FINDINGS: Pelvic  ring is intact. There is a transverse fracture through the proximal femoral shaft approximately 2 cm below the intratrochanteric region. Femoral head is well seated. No soft tissue abnormality is noted. Posterior displacement of the distal fracture fragment is seen. IMPRESSION: Transverse fracture as described in the proximal femoral shaft. Electronically Signed   By: Inez Catalina M.D.   On: 03/06/2019 21:14   DG FEMUR MIN 2 VIEWS LEFT  Result Date: 03/07/2019 CLINICAL DATA:  Known left femoral fracture EXAM: LEFT FEMUR 2 VIEWS COMPARISON:  Films from the previous day FLUOROSCOPY TIME:  Radiation Exposure Index (as provided by the fluoroscopic device): Not available If the device does not provide the exposure index: Fluoroscopy Time:  3 minutes 40 seconds Number of Acquired Images:  13 FINDINGS: Initial images again demonstrate the proximal left femoral shaft fracture. Fixation sideplate is noted in the proximal femur with multiple fixation screws. Medullary rod is in place as well. Proximal and distal fixation screws are seen. The fracture fragments are in near anatomic alignment. IMPRESSION: Status post ORIF of proximal left femoral fracture Electronically Signed   By: Inez Catalina M.D.   On: 03/07/2019 19:01   DG FEMUR PORT MIN 2 VIEWS LEFT  Result Date: 03/07/2019 CLINICAL DATA:  Status post ORIF of left femoral fracture EXAM: LEFT FEMUR PORTABLE 2 VIEWS COMPARISON:  03/06/2019 FINDINGS: Interval placement of medullary rod with proximal and distal fixation screws is seen. A lateral fixation sideplate along the proximal femur is seen. Prior partial knee replacement is noted. The fracture fragments are in near anatomic alignment. Patellofemoral degenerative changes are seen. IMPRESSION: Status post ORIF of left femoral fracture. Electronically Signed   By: Inez Catalina M.D.   On: 03/07/2019 20:04      Subjective: No complaints this morning.   Discharge Exam: BP (!) 122/59 (BP Location: Right Arm)    Pulse 75   Temp 97.6 F (36.4 C) (Oral)   Resp 18   Wt 100.3 kg   SpO2 100%   BMI 40.44 kg/m   General: Pt is alert, awake, not in acute distress Cardiovascular: RRR, S1/S2 +, no rubs, no gallops Respiratory: CTA bilaterally, no wheezing, no rhonchi Abdominal: Soft, NT, ND, bowel sounds + Extremities: no edema, no cyanosis    The results of significant diagnostics from this hospitalization (including imaging, microbiology, ancillary and laboratory)  are listed below for reference.     Microbiology: Recent Results (from the past 240 hour(s))  SARS CORONAVIRUS 2 (TAT 6-24 HRS) Nasopharyngeal Nasopharyngeal Swab     Status: None   Collection Time: 03/06/19  9:57 PM   Specimen: Nasopharyngeal Swab  Result Value Ref Range Status   SARS Coronavirus 2 NEGATIVE NEGATIVE Final    Comment: (NOTE) SARS-CoV-2 target nucleic acids are NOT DETECTED. The SARS-CoV-2 RNA is generally detectable in upper and lower respiratory specimens during the acute phase of infection. Negative results do not preclude SARS-CoV-2 infection, do not rule out co-infections with other pathogens, and should not be used as the sole basis for treatment or other patient management decisions. Negative results must be combined with clinical observations, patient history, and epidemiological information. The expected result is Negative. Fact Sheet for Patients: SugarRoll.be Fact Sheet for Healthcare Providers: https://www.woods-mathews.com/ This test is not yet approved or cleared by the [place] FDA and  has been authorized for detection and/or diagnosis of SARS-CoV-2 by FDA under an Emergency Use Authorization (EUA). This EUA will remain  in effect (meaning this test can be used) for the duration of the COVID-19 declaration under Section 56 4(b)(1) of the Act, 21 U.S.C. section 360bbb-3(b)(1), unless the authorization is terminated or revoked sooner. Performed at  [facility], [place] [address] [place], [place] [address]   SARS CORONAVIRUS 2 (TAT 6-24 HRS) Nasopharyngeal Nasopharyngeal Swab     Status: None   Collection Time: 03/12/19 12:00 PM   Specimen: Nasopharyngeal Swab  Result Value Ref Range Status   SARS Coronavirus 2 NEGATIVE NEGATIVE Final    Comment: (NOTE) SARS-CoV-2 target nucleic acids are NOT DETECTED. The SARS-CoV-2 RNA is generally detectable in upper and lower respiratory specimens during the acute phase of infection. Negative results do not preclude SARS-CoV-2 infection, do not rule out co-infections with other pathogens, and should not be used as the sole basis for treatment or other patient management decisions. Negative results must be combined with clinical observations, patient history, and epidemiological information. The expected result is Negative. Fact Sheet for Patients: SugarRoll.be Fact Sheet for Healthcare Providers: https://www.woods-mathews.com/ This test is not yet approved or cleared by the [place] FDA and  has been authorized for detection and/or diagnosis of SARS-CoV-2 by FDA under an Emergency Use Authorization (EUA). This EUA will remain  in effect (meaning this test can be used) for the duration of the COVID-19 declaration under Section 56 4(b)(1) of the Act, 21 U.S.C. section 360bbb-3(b)(1), unless the authorization is terminated or revoked sooner. Performed at [facility], [place] [address] [place], [place] [address]      Labs: Basic Metabolic Panel: Recent Labs  Lab 03/08/19 0323 03/09/19 0002 03/10/19 0804 03/12/19 0528 03/13/19 0352  NA 140 139 142 138 139  K 4.2 4.0 3.6 4.4 4.5  CL 104 104 104 100 101  CO2 29 29 30 28 28   GLUCOSE 153* 147* 108* 150* 124*  BUN 17 15 15 16 15   CREATININE 0.84 0.78 0.75 0.74 0.80  CALCIUM 7.7* 7.9* 8.3* 8.2* 8.6*   Liver Function Tests: Recent Labs  Lab 03/09/19 0739 03/13/19 0352  AST  44* 47*  ALT 19 41  ALKPHOS 39 57  BILITOT 1.1 1.2  PROT 5.1* 5.6*  ALBUMIN 2.6* 2.6*   CBC: Recent Labs  Lab 03/06/19 1900 03/07/19 1809 03/10/19 0804 03/10/19 1747 03/11/19 0957 03/12/19 0528 03/13/19 0352  WBC 14.6*   < > 20.3* 17.3* 12.9*  16.4* 14.6*  NEUTROABS 11.4*  --   --   --   --   --   --   HGB 12.5   < > 8.7* 7.9* 7.4* 7.4* 8.8*  HCT 40.2   < > 27.1* 25.7* 24.1* 23.8* 27.6*  MCV 100.0   < > 97.8 99.6 101.3* 100.8* 97.5  PLT 299   < > 229 244 247 285 353   < > = values in this interval not displayed.   CBG: Recent Labs  Lab 03/12/19 0044 03/12/19 1108 03/12/19 1627 03/13/19 0009 03/13/19 0715  GLUCAP 177* 154* 158* 136* 94   Hgb A1c No results for input(s): HGBA1C in the last 72 hours. Lipid Profile No results for input(s): CHOL, HDL, LDLCALC, TRIG, CHOLHDL, LDLDIRECT in the last 72 hours. Thyroid function studies No results for input(s): TSH, T4TOTAL, T3FREE, THYROIDAB in the last 72 hours.  Invalid input(s): FREET3 Urinalysis No results found for: COLORURINE, APPEARANCEUR, LABSPEC, PHURINE, GLUCOSEU, HGBUR, BILIRUBINUR, KETONESUR, PROTEINUR, UROBILINOGEN, NITRITE, LEUKOCYTESUR  FURTHER DISCHARGE INSTRUCTIONS:   Get Medicines reviewed and adjusted: Please take all your medications with you for your next visit with your Primary MD   Laboratory/radiological data: Please request your Primary MD to go over all hospital tests and procedure/radiological results at the follow up, please ask your Primary MD to get all Hospital records sent to his/her office.   In some cases, they will be blood work, cultures and biopsy results pending at the time of your discharge. Please request that your primary care M.D. goes through all the records of your hospital data and follows up on these results.   Also Note the following: If you experience worsening of your admission symptoms, develop shortness of breath, life threatening emergency, suicidal or homicidal  thoughts you must seek medical attention immediately by calling 911 or calling your MD immediately  if symptoms less severe.   You must read complete instructions/literature along with all the possible adverse reactions/side effects for all the Medicines you take and that have been prescribed to you. Take any new Medicines after you have completely understood and accpet all the possible adverse reactions/side effects.    Do not drive when taking Pain medications or sleeping medications (Benzodaizepines)   Do not take more than prescribed Pain, Sleep and Anxiety Medications. It is not advisable to combine anxiety,sleep and pain medications without talking with your primary care practitioner   Special Instructions: If you have smoked or chewed Tobacco  in the last 2 yrs please stop smoking, stop any regular Alcohol  and or any Recreational drug use.   Wear Seat belts while driving.   Please note: You were cared for by a hospitalist during your hospital stay. Once you are discharged, your primary care physician will handle any further medical issues. Please note that NO REFILLS for any discharge medications will be authorized once you are discharged, as it is imperative that you return to your primary care physician (or establish a relationship with a primary care physician if you do not have one) for your post hospital discharge needs so that they can reassess your need for medications and monitor your lab values.  Time coordinating discharge: 35 minutes  SIGNED:  Marzetta Board, MD, PhD 03/13/2019, 8:51 AM

## 2019-03-13 NOTE — Discharge Summary (Signed)
Physician Discharge Summary  Virginia Thompson [id number] DOB: 1928-10-18 DOA: 03/06/2019  PCP: Angeline Slim, MD  Admit date: 03/06/2019 Discharge date: 03/13/2019  Admitted From: home Disposition:  SNF  Recommendations for Outpatient Follow-up:  1. Follow up with PCP in 1-2 weeks 2. Follow up with Dr. Marcelino Scot in 2 weeks 3. 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 Patient denies any fall. Denies any fever chills chest pain or shortness of breath. ED Course:In the ER patient had CT lumbar and CT renal study and x-rays which showed left transverse proximal femur fracture for which ER physician had discussed with on-call orthopedic surgeon Dr. Lyla Glassing will be planning to do surgery in the morning at [facility] test came back negative. Labs show mild leukocytosis. EKG shows normal sinus rhythm with RBBB.  Hospital Course / Discharge diagnoses: Principal Problem Left hip fracture -Orthopedic surgery consulted, status post intramedullary nailing of the left femur on 1/8. DVT prophylaxis and pain control per orthopedic surgery's, WBAT. Lovenox for 30 days for DVT prophylaxis. FOllow up with Dr. Marcelino Scot in 2 weeks.   Active Problems Chronic diastolic CHF -Currently appears euvolemic, resume home medications Acute blood loss anemia -Likely in the setting of #1, received transfusion and Hb now stabilized.  Chronic hypotension -Patient was started on midodrine 10mg 3 times daily, blood pressure improved. Lactic acid is  normal, no evidence of infection, he has a history of hypotension in the past COPD with mild exacerbation with underlying chronic hypoxic respiratory failure -He is on chronic 2 L nasal cannula after pneumonia status post chemotherapy. Had mild wheezing requiring increase in her prednisone while hospitalized, respiratory status is at baseline, no further wheezing, continue prednisone at home regimen.  History of autoimmune hemolytic anemia -Continue prednisone, her transfusion requirements are likely due to acute blood loss anemia Leukocytosis -Likely due to steroids, afebrile, no source of infection Chronic bronchitis -continue home medications Mood disorder -On Cymbalta Chronic pain -Continue buprenorphine Sleep apnea -CPAP qhs GERD -On Nexium  Discharge Instructions   Allergies as of 03/13/2019      Reactions   Aleve [naproxen] Rash   Mobic [meloxicam] Rash   Tape Rash      Medication List    STOP taking these medications   cephALEXin 250 MG capsule Commonly known as: KEFLEX     TAKE these medications   acetaminophen 500 MG tablet Commonly known as: TYLENOL Take by mouth.   aspirin EC 81 MG tablet Take 81 mg by mouth daily.   buprenorphine 5 MCG/HR Ptwk Commonly known as: BUTRANS Place 1 patch onto the skin once a week.   docusate sodium 100 MG capsule Commonly known as: COLACE Take 200 mg by mouth daily.   DULoxetine 60 MG capsule Commonly known as: CYMBALTA Take 60 mg by mouth daily.   enoxaparin 40 MG/0.4ML injection Commonly known as: LOVENOX Inject 0.4 mLs (40 mg total) into the skin daily.   esomeprazole 40 MG capsule Commonly known as: NEXIUM Take 40 mg by mouth daily.   folic acid 1 MG tablet Commonly known as: FOLVITE Take 1 mg  by mouth 3 (three) times daily.   furosemide 40 MG tablet Commonly known as: LASIX Take 60 mg by mouth daily.   ICAPS AREDS 2 PO Take 2 tablets by mouth daily.   loratadine 10 MG tablet Commonly known as:  CLARITIN Take 10 mg by mouth daily.   metoprolol tartrate 25 MG tablet Commonly known as: LOPRESSOR Take 12.5 mg by mouth 2 (two) times daily.   midodrine 10 MG tablet Commonly known as: PROAMATINE Take 1 tablet (10 mg total) by mouth 3 (three) times daily with meals.   oxyCODONE 5 MG immediate release tablet Commonly known as: Oxy IR/ROXICODONE Take 1 tablet (5 mg total) by mouth every 4 (four) hours as needed for up to 5 days.   pramipexole 1 MG tablet Commonly known as: MIRAPEX Take 1 mg by mouth at bedtime.   predniSONE 5 MG tablet Commonly known as: DELTASONE Take 5 mg by mouth daily.   ProAir HFA 108 (90 Base) MCG/ACT inhaler Generic drug: albuterol Inhale 2 puffs into the lungs every 6 (six) hours as needed for wheezing or shortness of breath.   Spiriva HandiHaler 18 MCG inhalation capsule Generic drug: tiotropium Place 18 mcg into inhaler and inhale daily.   sulfamethoxazole-trimethoprim 800-160 MG tablet Commonly known as: BACTRIM DS TAKE ONE TABLET BY MOUTH EVERY MONDAY, WEDNESDAY, AND FRIDAY       Contact information for follow-up providers    Altamese [place], MD. Schedule an appointment as soon as possible for a visit in 2 week(s).   Specialty: Orthopedic Surgery Contact information: [place] [address] [phone]            Contact information for after-discharge care    Destination    [facility] .   Service: Skilled Nursing Contact information: [id number] [phone]                  Consultations:  orthopedic surgery   Procedures/Studies:  IM nail   DG Pelvis Portable  Result Date: 03/07/2019 CLINICAL DATA:  Status post left femur surgery EXAM: PORTABLE PELVIS 1-2 VIEWS COMPARISON:  None. FINDINGS: Pelvic ring is intact. Postsurgical changes are noted in the left femur. Previously seen femoral shaft fracture is again noted. Vascular  calcifications are seen. IMPRESSION: Status post ORIF of left femoral fracture. Electronically Signed   By: Inez Catalina M.D.   On: 03/07/2019 20:03   CT L-SPINE NO CHARGE  Result Date: 03/06/2019 CLINICAL DATA:  Unable to walk.  Low back pain. EXAM: CT LUMBAR SPINE WITHOUT CONTRAST TECHNIQUE: Multidetector CT imaging of the lumbar spine was performed without intravenous contrast administration. Multiplanar CT image reconstructions were also generated. COMPARISON:  None. FINDINGS: Segmentation: 5 lumbar type vertebral bodies. Alignment: Mild curvature convex to the left with the apex at L2-3. Vertebrae: Old minor superior endplate compression deformity on the left at L4. Old minor superior endplate compression deformity more on the right at L2. No evidence of recent fracture. No focal bone lesion. Paraspinal and other soft tissues: Nonobstructing stone in the lower pole the right kidney. Aortic atherosclerosis. Disc levels: T12-L1: Minimal disc bulge.  No stenosis. L1-2: Disc degeneration with loss of disc height. Endplate osteophytes and mild bulging of the disc. No compressive stenosis. L2-3: Minimal disc bulge. Minimal facet hypertrophy. No compressive stenosis. L3-4: Mild disc bulge. Mild facet hypertrophy. Mild narrowing of the lateral recesses but no definite compressive stenosis. L4-5: Moderate disc bulge more prominent  towards the left. Facet and ligamentous hypertrophy. Stenosis of both lateral recesses left more than right. Neural compression could occur at this level, particularly on the left. L5-S1: Distant left hemilaminectomy. Disc space narrowing. Endplate osteophytes and mild annular bulging. No central canal stenosis. Mild bony foraminal narrowing on the left. Sacroiliac joints are unremarkable considering age. IMPRESSION: No evidence of recent fracture. Old minor superior endplate depression on the left at L4. Old minor superior endplate depression at L2, more on the right. No advanced spinal  stenosis. Multilevel degenerative disc disease and degenerative facet disease, typical for age. At L4-5, there is bilateral lateral recess narrowing left more than right. Neural compression could possibly occur at this level, particularly on the left. At L5-S1, there has been a distant left hemilaminectomy. There is mild bony foraminal stenosis on the left at this level, not definitely compressive. Electronically Signed   By: Nelson Chimes M.D.   On: 03/06/2019 19:36   DG CHEST PORT 1 VIEW  Result Date: 03/10/2019 CLINICAL DATA:  Tachypnea.  Shortness of breath. EXAM: PORTABLE CHEST 1 VIEW COMPARISON:  06/17/2006 FINDINGS: Cardiomegaly. Chronic aortic atherosclerosis. Chronic interstitial lung markings. Possible venous hypertension with mild interstitial edema. No consolidation, collapse or visible effusion. IMPRESSION: Cardiomegaly. Aortic atherosclerosis. Chronic lung disease. Possible venous hypertension with mild interstitial edema. Electronically Signed   By: Nelson Chimes M.D.   On: 03/10/2019 10:02   DG Knee Complete 4 Views Left  Result Date: 03/06/2019 CLINICAL DATA:  Recent fall with left knee pain, initial encounter EXAM: LEFT KNEE - COMPLETE 4+ VIEW COMPARISON:  None. FINDINGS: Partial knee replacement is noted laterally. Changes of prior fibular fracture are noted. Degenerative changes are noted in all 3 joint compartments. No joint effusion is seen. No acute fracture is seen. IMPRESSION: Chronic changes without acute abnormality. Electronically Signed   By: Inez Catalina M.D.   On: 03/06/2019 21:14   DG C-Arm 1-60 Min  Result Date: 03/07/2019 CLINICAL DATA:  Femur fracture EXAM: DG C-ARM 1-60 MIN CONTRAST:  None FLUOROSCOPY TIME:  Fluoroscopy Time:  3 minutes 40 seconds Number of Acquired Spot Images: 13 COMPARISON:  03/06/2019 FINDINGS: Thirteen low resolution intraoperative spot views of the left femur. Displaced proximal femoral shaft fracture. Evidence of prior hemiarthroplasty at the knee.  Subsequent images demonstrate multiple plate and screw fixation of the proximal femoral fracture. Insertion of intramedullary rod and distal fixating screws with anatomic alignment. Fixating screw placed across the left femoral neck and head. IMPRESSION: Intraoperative fluoroscopic assistance provided during surgical fixation of proximal left femur fracture Electronically Signed   By: Donavan Foil M.D.   On: 03/07/2019 19:03   ECHOCARDIOGRAM COMPLETE  Result Date: 03/08/2019   ECHOCARDIOGRAM REPORT   Patient Name:   FAISA ROBERDS Date of Exam: 03/08/2019 Medical Rec #:  [id number]      Height:       62.0 in Accession #:    [id number]     Weight:       217.6 lb Date of Birth:  Feb 20, 1929     BSA:          1.98 m Patient Age:    48 years       BP:           68/57 mmHg Patient Gender: F              HR:           95 bpm. Exam Location:  Inpatient Procedure: 2D Echo Indications:  Aortic Stenosis 424.1/135.0  History:        Patient has prior history of Echocardiogram examinations, most                 recent 10/06/2004. CHF; Risk Factors:Hypertension.  Sonographer:    Clayton Lefort RDCS (AE) Referring Phys: [phone] RALPH A NETTEY  Sonographer Comments: Limited patient mobility. IMPRESSIONS  1. Left ventricular ejection fraction, by visual estimation, is 60 to 65%. The left ventricle has normal function. There is moderately increased left ventricular hypertrophy.  2. Elevated left atrial pressure.  3. Global right ventricle has normal systolic function.The right ventricular size is normal.  4. Left atrial size was severely dilated.  5. Right atrial size was normal.  6. Moderate mitral annular calcification.  7. The mitral valve is normal in structure. No evidence of mitral valve regurgitation. No evidence of mitral stenosis.  8. The tricuspid valve is normal in structure.  9. The aortic valve has an indeterminant number of cusps. Aortic valve regurgitation is not visualized. Moderate aortic valve stenosis. 10. The pulmonic  valve was not well visualized. Pulmonic valve regurgitation is not visualized. 11. The left ventricle has no regional wall motion abnormalities. FINDINGS  Left Ventricle: Left ventricular ejection fraction, by visual estimation, is 60 to 65%. The left ventricle has normal function. The left ventricle has no regional wall motion abnormalities. There is moderately increased left ventricular hypertrophy. Elevated left atrial pressure. Right Ventricle: The right ventricular size is normal.Global RV systolic function is has normal systolic function. Left Atrium: Left atrial size was severely dilated. Right Atrium: Right atrial size was normal in size Pericardium: There is no evidence of pericardial effusion. Mitral Valve: The mitral valve is normal in structure. Moderate mitral annular calcification. No evidence of mitral valve regurgitation. No evidence of mitral valve stenosis by observation. MV peak gradient, 19.2 mmHg. Tricuspid Valve: The tricuspid valve is normal in structure. Tricuspid valve regurgitation is trivial. Aortic Valve: The aortic valve has an indeterminant number of cusps. Aortic valve regurgitation is not visualized. Moderate aortic stenosis is present. Aortic valve mean gradient measures 20.0 mmHg. Aortic valve peak gradient measures 32.5 mmHg. Aortic valve area, by VTI measures 1.13 cm. Pulmonic Valve: The pulmonic valve was not well visualized. Pulmonic valve regurgitation is not visualized. Pulmonic regurgitation is not visualized. Aorta: The aortic root is normal in size and structure. Venous: The inferior vena cava was not well visualized. IAS/Shunts: No atrial level shunt detected by color flow Doppler. Additional Comments: Normal LV systolic function; moderate LVH; moderate AS (mean gradient 21 mmHg); mean gradient across MV elevated (11 mmHg) but no MS by pressure half time; severe LAE.  LEFT VENTRICLE PLAX 2D LVIDd:         3.40 cm  Diastology LVIDs:         3.28 cm  LV e' lateral:   5.44  cm/s LV PW:         1.77 cm  LV E/e' lateral: 31.6 LV IVS:        1.72 cm  LV e' medial:    7.51 cm/s LVOT diam:     1.80 cm  LV E/e' medial:  22.9 LV SV:         4 ml LV SV Index:   1.85 LVOT Area:     2.54 cm  RIGHT VENTRICLE RV Basal diam:  2.79 cm RV S prime:     13.90 cm/s TAPSE (M-mode): 2.1 cm LEFT ATRIUM  Index       RIGHT ATRIUM           Index LA diam:        4.40 cm  2.22 cm/m  RA Area:     11.50 cm LA Vol (A2C):   109.0 ml 55.01 ml/m RA Volume:   20.80 ml  10.50 ml/m LA Vol (A4C):   81.6 ml  41.18 ml/m LA Biplane Vol: 94.7 ml  47.79 ml/m  AORTIC VALVE AV Area (Vmax):    1.26 cm AV Area (Vmean):   1.14 cm AV Area (VTI):     1.13 cm AV Vmax:           285.00 cm/s AV Vmean:          214.000 cm/s AV VTI:            0.607 m AV Peak Grad:      32.5 mmHg AV Mean Grad:      20.0 mmHg LVOT Vmax:         141.50 cm/s LVOT Vmean:        95.450 cm/s LVOT VTI:          0.270 m LVOT/AV VTI ratio: 0.45  AORTA Ao Root diam: 2.80 cm MITRAL VALVE                         TRICUSPID VALVE MV Area (PHT): 3.19 cm              TR Peak grad:   15.8 mmHg MV Peak grad:  19.2 mmHg             TR Vmax:        266.00 cm/s MV Mean grad:  11.0 mmHg MV Vmax:       2.19 m/s              SHUNTS MV Vmean:      154.0 cm/s            Systemic VTI:  0.27 m MV VTI:        0.43 m                Systemic Diam: 1.80 cm MV PHT:        69.02 msec MV Decel Time: 238 msec MV E velocity: 172.00 cm/s 103 cm/s MV A velocity: 185.00 cm/s 70.3 cm/s MV E/A ratio:  0.93        1.5  Kirk Ruths MD Electronically signed by Kirk Ruths MD Signature Date/Time: 03/08/2019/2:43:58 PM    Final    CT Renal Stone Study  Addendum Date: 03/06/2019   ADDENDUM REPORT: 03/06/2019 20:07 ADDENDUM: Upon further evaluation, an acute displaced fracture deformity of the proximal left femur is seen. This involves the proximal left femoral shaft, just distal to the inter trochanteric region. Approximately 1/2 shaft width medial displacement of the  distal fracture site is noted. Electronically Signed   By: Virgina Norfolk M.D.   On: 03/06/2019 20:07   Result Date: 03/06/2019 CLINICAL DATA:  Flank pain. EXAM: CT ABDOMEN AND PELVIS WITHOUT CONTRAST TECHNIQUE: Multidetector CT imaging of the abdomen and pelvis was performed following the standard protocol without IV contrast. COMPARISON:  None. FINDINGS: Lower chest: Mild atelectasis is seen within the bilateral lung bases. Marked severity coronary artery calcification is seen. Hepatobiliary: Numerous punctate calcified granulomas are seen scattered throughout the liver. The gallbladder is not identified. Pancreas: Unremarkable. No pancreatic ductal dilatation or surrounding inflammatory  changes. Spleen: The spleen is absent. Adrenals/Urinary Tract: Adrenal glands are unremarkable. Kidneys are normal, without focal lesions or hydronephrosis. A 7 mm nonobstructing renal stone is seen within the mid right kidney. Bladder is unremarkable. Stomach/Bowel: Stomach is within normal limits. The appendix is not identified. No evidence of bowel wall thickening, distention, or inflammatory changes. Noninflamed diverticula are seen throughout the sigmoid colon. Vascular/Lymphatic: Marked severity aortic atherosclerosis. No enlarged abdominal or pelvic lymph nodes. Reproductive: Status post hysterectomy. No adnexal masses. Other: A surgical scar is seen along the midline of the anterior abdominal wall Musculoskeletal: Multilevel degenerative changes seen throughout the lumbar spine. IMPRESSION: 1. 7 mm nonobstructing renal stone within the right kidney. 2. Absent spleen. 3. Sigmoid diverticulosis. Electronically Signed: By: Virgina Norfolk M.D. On: 03/06/2019 19:49   DG Hip Unilat W or Wo Pelvis 2-3 Views Left  Result Date: 03/06/2019 CLINICAL DATA:  Recent fall with left hip pain, initial encounter EXAM: DG HIP (WITH OR WITHOUT PELVIS) 2-3V LEFT COMPARISON:  None. FINDINGS: Pelvic ring is intact. There is a  transverse fracture through the proximal femoral shaft approximately 2 cm below the intratrochanteric region. Femoral head is well seated. No soft tissue abnormality is noted. Posterior displacement of the distal fracture fragment is seen. IMPRESSION: Transverse fracture as described in the proximal femoral shaft. Electronically Signed   By: Inez Catalina M.D.   On: 03/06/2019 21:14   DG FEMUR MIN 2 VIEWS LEFT  Result Date: 03/07/2019 CLINICAL DATA:  Known left femoral fracture EXAM: LEFT FEMUR 2 VIEWS COMPARISON:  Films from the previous day FLUOROSCOPY TIME:  Radiation Exposure Index (as provided by the fluoroscopic device): Not available If the device does not provide the exposure index: Fluoroscopy Time:  3 minutes 40 seconds Number of Acquired Images:  13 FINDINGS: Initial images again demonstrate the proximal left femoral shaft fracture. Fixation sideplate is noted in the proximal femur with multiple fixation screws. Medullary rod is in place as well. Proximal and distal fixation screws are seen. The fracture fragments are in near anatomic alignment. IMPRESSION: Status post ORIF of proximal left femoral fracture Electronically Signed   By: Inez Catalina M.D.   On: 03/07/2019 19:01   DG FEMUR PORT MIN 2 VIEWS LEFT  Result Date: 03/07/2019 CLINICAL DATA:  Status post ORIF of left femoral fracture EXAM: LEFT FEMUR PORTABLE 2 VIEWS COMPARISON:  03/06/2019 FINDINGS: Interval placement of medullary rod with proximal and distal fixation screws is seen. A lateral fixation sideplate along the proximal femur is seen. Prior partial knee replacement is noted. The fracture fragments are in near anatomic alignment. Patellofemoral degenerative changes are seen. IMPRESSION: Status post ORIF of left femoral fracture. Electronically Signed   By: Inez Catalina M.D.   On: 03/07/2019 20:04      Subjective: No complaints this morning  Discharge Exam: BP (!) 122/59 (BP Location: Right Arm)   Pulse 75   Temp 97.6 F  (36.4 C) (Oral)   Resp 18   Wt 100.3 kg   SpO2 100%   BMI 40.44 kg/m   General: Pt is alert, awake, not in acute distress Cardiovascular: RRR, S1/S2 +, no rubs, no gallops Respiratory: CTA bilaterally, no wheezing, no rhonchi Abdominal: Soft, NT, ND, bowel sounds + Extremities: no edema, no cyanosis    The results of significant diagnostics from this hospitalization (including imaging, microbiology, ancillary and laboratory) are listed below for reference.     Microbiology: Recent Results (from the past 240 hour(s))  SARS CORONAVIRUS 2 (TAT  6-24 HRS) Nasopharyngeal Nasopharyngeal Swab     Status: None   Collection Time: 03/06/19  9:57 PM   Specimen: Nasopharyngeal Swab  Result Value Ref Range Status   SARS Coronavirus 2 NEGATIVE NEGATIVE Final    Comment: (NOTE) SARS-CoV-2 target nucleic acids are NOT DETECTED. The SARS-CoV-2 RNA is generally detectable in upper and lower respiratory specimens during the acute phase of infection. Negative results do not preclude SARS-CoV-2 infection, do not rule out co-infections with other pathogens, and should not be used as the sole basis for treatment or other patient management decisions. Negative results must be combined with clinical observations, patient history, and epidemiological information. The expected result is Negative. Fact Sheet for Patients: SugarRoll.be Fact Sheet for Healthcare Providers: https://www.woods-mathews.com/ This test is not yet approved or cleared by the [place] FDA and  has been authorized for detection and/or diagnosis of SARS-CoV-2 by FDA under an Emergency Use Authorization (EUA). This EUA will remain  in effect (meaning this test can be used) for the duration of the COVID-19 declaration under Section 56 4(b)(1) of the Act, 21 U.S.C. section 360bbb-3(b)(1), unless the authorization is terminated or revoked sooner. Performed at [facility],  [place] [address], [place], [place] [address]   SARS CORONAVIRUS 2 (TAT 6-24 HRS) Nasopharyngeal Nasopharyngeal Swab     Status: None   Collection Time: 03/12/19 12:00 PM   Specimen: Nasopharyngeal Swab  Result Value Ref Range Status   SARS Coronavirus 2 NEGATIVE NEGATIVE Final    Comment: (NOTE) SARS-CoV-2 target nucleic acids are NOT DETECTED. The SARS-CoV-2 RNA is generally detectable in upper and lower respiratory specimens during the acute phase of infection. Negative results do not preclude SARS-CoV-2 infection, do not rule out co-infections with other pathogens, and should not be used as the sole basis for treatment or other patient management decisions. Negative results must be combined with clinical observations, patient history, and epidemiological information. The expected result is Negative. Fact Sheet for Patients: SugarRoll.be Fact Sheet for Healthcare Providers: https://www.woods-mathews.com/ This test is not yet approved or cleared by the [place] FDA and  has been authorized for detection and/or diagnosis of SARS-CoV-2 by FDA under an Emergency Use Authorization (EUA). This EUA will remain  in effect (meaning this test can be used) for the duration of the COVID-19 declaration under Section 56 4(b)(1) of the Act, 21 U.S.C. section 360bbb-3(b)(1), unless the authorization is terminated or revoked sooner. Performed at [facility], [place] [address], [place], [place] [address]      Labs: Basic Metabolic Panel: Recent Labs  Lab 03/08/19 0323 03/09/19 0002 03/10/19 0804 03/12/19 0528 03/13/19 0352  NA 140 139 142 138 139  K 4.2 4.0 3.6 4.4 4.5  CL 104 104 104 100 101  CO2 29 29 30 28 28   GLUCOSE 153* 147* 108* 150* 124*  BUN 17 15 15 16 15   CREATININE 0.84 0.78 0.75 0.74 0.80  CALCIUM 7.7* 7.9* 8.3* 8.2* 8.6*   Liver Function Tests: Recent Labs  Lab 03/09/19 0739 03/13/19 0352  AST 44* 47*  ALT 19 41   ALKPHOS 39 57  BILITOT 1.1 1.2  PROT 5.1* 5.6*  ALBUMIN 2.6* 2.6*   CBC: Recent Labs  Lab 03/06/19 1900 03/07/19 1809 03/10/19 0804 03/10/19 1747 03/11/19 0957 03/12/19 0528 03/13/19 0352  WBC 14.6*   < > 20.3* 17.3* 12.9* 16.4* 14.6*  NEUTROABS 11.4*  --   --   --   --   --   --  HGB 12.5   < > 8.7* 7.9* 7.4* 7.4* 8.8*  HCT 40.2   < > 27.1* 25.7* 24.1* 23.8* 27.6*  MCV 100.0   < > 97.8 99.6 101.3* 100.8* 97.5  PLT 299   < > 229 244 247 285 353   < > = values in this interval not displayed.   CBG: Recent Labs  Lab 03/12/19 0044 03/12/19 1108 03/12/19 1627 03/13/19 0009 03/13/19 0715  GLUCAP 177* 154* 158* 136* 94   Hgb A1c No results for input(s): HGBA1C in the last 72 hours. Lipid Profile No results for input(s): CHOL, HDL, LDLCALC, TRIG, CHOLHDL, LDLDIRECT in the last 72 hours. Thyroid function studies No results for input(s): TSH, T4TOTAL, T3FREE, THYROIDAB in the last 72 hours.  Invalid input(s): FREET3 Urinalysis No results found for: COLORURINE, APPEARANCEUR, LABSPEC, PHURINE, GLUCOSEU, HGBUR, BILIRUBINUR, KETONESUR, PROTEINUR, UROBILINOGEN, NITRITE, LEUKOCYTESUR  FURTHER DISCHARGE INSTRUCTIONS:   Get Medicines reviewed and adjusted: Please take all your medications with you for your next visit with your Primary MD   Laboratory/radiological data: Please request your Primary MD to go over all hospital tests and procedure/radiological results at the follow up, please ask your Primary MD to get all Hospital records sent to his/her office.   In some cases, they will be blood work, cultures and biopsy results pending at the time of your discharge. Please request that your primary care M.D. goes through all the records of your hospital data and follows up on these results.   Also Note the following: If you experience worsening of your admission symptoms, develop shortness of breath, life threatening emergency, suicidal or homicidal thoughts you must seek  medical attention immediately by calling 911 or calling your MD immediately  if symptoms less severe.   You must read complete instructions/literature along with all the possible adverse reactions/side effects for all the Medicines you take and that have been prescribed to you. Take any new Medicines after you have completely understood and accpet all the possible adverse reactions/side effects.    Do not drive when taking Pain medications or sleeping medications (Benzodaizepines)   Do not take more than prescribed Pain, Sleep and Anxiety Medications. It is not advisable to combine anxiety,sleep and pain medications without talking with your primary care practitioner   Special Instructions: If you have smoked or chewed Tobacco  in the last 2 yrs please stop smoking, stop any regular Alcohol  and or any Recreational drug use.   Wear Seat belts while driving.   Please note: You were cared for by a hospitalist during your hospital stay. Once you are discharged, your primary care physician will handle any further medical issues. Please note that NO REFILLS for any discharge medications will be authorized once you are discharged, as it is imperative that you return to your primary care physician (or establish a relationship with a primary care physician if you do not have one) for your post hospital discharge needs so that they can reassess your need for medications and monitor your lab values.  Time coordinating discharge: 35 minutes  SIGNED:  Marzetta Board, MD, PhD 03/13/2019, 8:57 AM

## 2019-03-13 NOTE — Progress Notes (Signed)
Made sure patient's machine (home unit) was near her bed and had water in it.  Patient was placed on machine but came off to take pain medications.  Patient self manages her machine and has her mask near her to place back on.  No distress noted, will continue to monitor.

## 2019-03-13 NOTE — Assessment & Plan Note (Addendum)
Risk discussed with patient.  Attempt to decrease opioids to lowest prehospitalization dose after 5 days.

## 2019-03-13 NOTE — Progress Notes (Signed)
2 tabs hydrocodone pulled prior to patient d/c one tab given to the patient, one tab return to phamarcy.

## 2019-03-19 ENCOUNTER — Encounter: Payer: Self-pay | Admitting: Adult Health

## 2019-03-19 ENCOUNTER — Non-Acute Institutional Stay (SKILLED_NURSING_FACILITY): Payer: Medicare Other | Admitting: Adult Health

## 2019-03-19 DIAGNOSIS — Z9989 Dependence on other enabling machines and devices: Secondary | ICD-10-CM

## 2019-03-19 DIAGNOSIS — D62 Acute posthemorrhagic anemia: Secondary | ICD-10-CM

## 2019-03-19 DIAGNOSIS — I5032 Chronic diastolic (congestive) heart failure: Secondary | ICD-10-CM

## 2019-03-19 DIAGNOSIS — S7292XS Unspecified fracture of left femur, sequela: Secondary | ICD-10-CM | POA: Diagnosis not present

## 2019-03-19 DIAGNOSIS — G893 Neoplasm related pain (acute) (chronic): Secondary | ICD-10-CM

## 2019-03-19 DIAGNOSIS — J418 Mixed simple and mucopurulent chronic bronchitis: Secondary | ICD-10-CM

## 2019-03-19 DIAGNOSIS — I959 Hypotension, unspecified: Secondary | ICD-10-CM

## 2019-03-19 DIAGNOSIS — K5901 Slow transit constipation: Secondary | ICD-10-CM

## 2019-03-19 DIAGNOSIS — G4733 Obstructive sleep apnea (adult) (pediatric): Secondary | ICD-10-CM

## 2019-03-19 NOTE — Progress Notes (Signed)
Location:  [facility] Room Number: 306/B Place of Service:  SNF (31) Provider:  Durenda Age, DNP, FNP-BC  Patient Care Team: Angeline Slim, MD as PCP - General (Internal Medicine)  Extended Emergency Contact Information Primary Emergency Contact: [name],[name] Address: [address]          [place] [address] Johnnette Litter of [place] Mobile Phone: [phone] Relation: Daughter  Code Status:  Full Code  Goals of care: Advanced Directive information Advanced Directives 03/19/2019  Does Patient Have a Medical Advance Directive? Yes  Type of Advance Directive (No Data)  Does patient want to make changes to medical advance directive? No - Patient declined  Would patient like information on creating a medical advance directive? -     Chief Complaint  Patient presents with  . Hospitalization Follow-up    Hospitalization Follow Up    HPI:  Pt is a 86 y.o. 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 She was admitted to [facility] on 03/13/19 post hospitalization 03/06/19 to 03/13/19. She was having increasing pain in the left hip for a week with no fall history. CT lumbar and CT renal study and x-ray showed left transverse proximal femur fracture. Orthopedic surgery was consulted and performed intramedullary nailing of the left femur on 1/8.21. DVT prophylaxis is Lovenox X 30 days and now on WBAT.  She continue to ask for PRN Oxycodone for right femur pain. She complained of constipation. No SOB. She takes Prednisone and Spiriva for COPD. BPs 92/52, 107/58, 112/56, 128/68.  She takes midodrine for hypotension. Currently, she gets PT and OT.   Past Medical History:  Diagnosis Date  . Blood transfusion without reported diagnosis   . Cancer ([place])   . CHF (congestive heart failure) ([place])   . Hypertension   .  Lymphoma ([place])   . Sleep apnea    Past Surgical History:  Procedure Laterality Date  . ABDOMINAL HYSTERECTOMY    . APPENDECTOMY    . BACK SURGERY    . BREAST CYST ASPIRATION Bilateral   . CHOLECYSTECTOMY    . EYE SURGERY    . INTRAMEDULLARY (IM) NAIL INTERTROCHANTERIC Left 03/07/2019   Procedure: INTRAMEDULLARY (IM) NAIL INTERTROCHANTRIC;  Surgeon: Altamese Milltown, MD;  Location: [facility] [place];  Service: Orthopedics;  Laterality: Left;  . JOINT REPLACEMENT Left   . SPLENECTOMY    . TONSILLECTOMY      Allergies  Allergen Reactions  . Aleve [Naproxen] Rash  . Mobic [Meloxicam] Rash  . Tape Rash    Outpatient Encounter Medications as of 03/19/2019  Medication Sig  . acetaminophen (TYLENOL) 500 MG tablet Take 500 mg by mouth every 6 (six) hours as needed.   Marland Kitchen albuterol (PROAIR HFA) 108 (90 Base) MCG/ACT inhaler Inhale 2 puffs into the lungs every 6 (six) hours as needed for wheezing or shortness of breath.   Marland Kitchen aspirin EC 81 MG tablet Take 81 mg by mouth daily.   Marland Kitchen docusate sodium (COLACE) 100 MG capsule Take 200 mg by mouth daily.   . DULoxetine (CYMBALTA) 60 MG capsule Take 60 mg by mouth daily.   Marland Kitchen enoxaparin (LOVENOX) 40 MG/0.4ML injection Inject 0.4 mLs (40 mg total) into the skin daily.  Marland Kitchen esomeprazole (NEXIUM) 40 MG capsule Take 40 mg by mouth daily.   . folic acid (FOLVITE) 1 MG tablet Take 1 mg by mouth 3 (three) times daily.   Marland Kitchen  furosemide (LASIX) 40 MG tablet Take 60 mg by mouth daily.   Marland Kitchen loratadine (CLARITIN) 10 MG tablet Take 10 mg by mouth daily.  . metoprolol tartrate (LOPRESSOR) 25 MG tablet Take 12.5 mg by mouth 2 (two) times daily.   . midodrine (PROAMATINE) 10 MG tablet Take 1 tablet (10 mg total) by mouth 3 (three) times daily with meals.  . Multiple Vitamins-Minerals (ICAPS AREDS 2 PO) Take 2 tablets by mouth daily.  Marland Kitchen oxyCODONE (OXY IR/ROXICODONE) 5 MG immediate release tablet Take 5 mg by mouth every 6 (six) hours as needed for severe pain.  . OXYGEN Inhale 2 L into  the lungs.  . pramipexole (MIRAPEX) 1 MG tablet Take 1 mg by mouth at bedtime.   . predniSONE (DELTASONE) 5 MG tablet Take 5 mg by mouth daily.   Marland Kitchen sulfamethoxazole-trimethoprim (BACTRIM DS,SEPTRA DS) 800-160 MG tablet TAKE ONE TABLET BY MOUTH EVERY MONDAY, WEDNESDAY, AND FRIDAY  . tiotropium (SPIRIVA HANDIHALER) 18 MCG inhalation capsule Place 18 mcg into inhaler and inhale daily.   . [DISCONTINUED] Ascorbic Acid (VITAMIN C) 500 MG CAPS Take 500 mg by mouth daily.  . [DISCONTINUED] Cholecalciferol (VITAMIN D) 125 MCG (5000 UT) CAPS Take 1 capsule by mouth daily.   No facility-administered encounter medications on file as of 03/19/2019.    Review of Systems  GENERAL: No change in appetite, no fatigue, no weight changes, no fever, chills or weakness MOUTH and THROAT: Denies oral discomfort, gingival pain or bleeding RESPIRATORY: no cough, SOB, DOE, wheezing, hemoptysis CARDIAC: No chest pain, edema or palpitations GI: +constipated GU: Denies dysuria, frequency, hematuria, incontinence, or discharge NEUROLOGICAL: Denies dizziness, syncope, numbness, or headache PSYCHIATRIC: Denies feelings of depression or anxiety. No report of hallucinations, insomnia, paranoia, or agitation    Immunization History  Administered Date(s) Administered  . Influenza, High Dose Seasonal PF 11/15/2011, 12/02/2014, 12/27/2016, 12/28/2018  . Influenza-Unspecified 12/01/2009, 11/27/2013  . Pneumococcal Conjugate-13 01/24/2014  . Tdap 12/17/2013, 04/08/2016   Pertinent  Health Maintenance Due  Topic Date Due  . PNA vac Low Risk Adult (2 of 2 - PPSV23) 01/25/2015  . INFLUENZA VACCINE  Completed  . DEXA SCAN  Completed   No flowsheet data found.   Vitals:   03/19/19 1413  BP: (!) 92/52  Pulse: 72  Resp: 20  Temp: (!) 97.5 F (36.4 C)  TempSrc: Oral  SpO2: 92%  Weight: 216 lb (98 kg)  Height: 5\' 3"  (1.6 m)   Body mass index is 38.26 kg/m.  Physical Exam  GENERAL APPEARANCE: Well nourished.  In no acute distress. Morbidly obese SKIN:   Left distal and proximal left femur surgical site with no erythema, dry, covered with dressing MOUTH and THROAT: Lips are without lesions. Oral mucosa is moist and without lesions. Tongue is normal in shape, size, and color and without lesions RESPIRATORY: Breathing is even & unlabored, BS CTAB CARDIAC: RRR, no murmur,no extra heart sounds, LLE 1-2+ edema GI: Abdomen soft, normal BS, no masses, no tenderness NEUROLOGICAL: There is no tremor. Speech is clear. Alert and oriented X 3. PSYCHIATRIC:  Affect and behavior are appropriate  Labs reviewed: Recent Labs    03/10/19 0804 03/12/19 0528 03/13/19 0352  NA 142 138 139  K 3.6 4.4 4.5  CL 104 100 101  CO2 30 28 28   GLUCOSE 108* 150* 124*  BUN 15 16 15   CREATININE 0.75 0.74 0.80  CALCIUM 8.3* 8.2* 8.6*   Recent Labs    03/09/19 0739 03/13/19 0352  AST 44*  47*  ALT 19 41  ALKPHOS 39 57  BILITOT 1.1 1.2  PROT 5.1* 5.6*  ALBUMIN 2.6* 2.6*   Recent Labs    03/06/19 1900 03/07/19 1809 03/11/19 0957 03/12/19 0528 03/13/19 0352  WBC 14.6*   < > 12.9* 16.4* 14.6*  NEUTROABS 11.4*  --   --   --   --   HGB 12.5   < > 7.4* 7.4* 8.8*  HCT 40.2   < > 24.1* 23.8* 27.6*  MCV 100.0   < > 101.3* 100.8* 97.5  PLT 299   < > 247 285 353   < > = values in this interval not displayed.     Significant Diagnostic Results in last 30 days:  DG Pelvis Portable  Result Date: 03/07/2019 CLINICAL DATA:  Status post left femur surgery EXAM: PORTABLE PELVIS 1-2 VIEWS COMPARISON:  None. FINDINGS: Pelvic ring is intact. Postsurgical changes are noted in the left femur. Previously seen femoral shaft fracture is again noted. Vascular calcifications are seen. IMPRESSION: Status post ORIF of left femoral fracture. Electronically Signed   By: Inez Catalina M.D.   On: 03/07/2019 20:03   CT L-SPINE NO CHARGE  Result Date: 03/06/2019 CLINICAL DATA:  Unable to walk.  Low back pain. EXAM: CT LUMBAR SPINE WITHOUT  CONTRAST TECHNIQUE: Multidetector CT imaging of the lumbar spine was performed without intravenous contrast administration. Multiplanar CT image reconstructions were also generated. COMPARISON:  None. FINDINGS: Segmentation: 5 lumbar type vertebral bodies. Alignment: Mild curvature convex to the left with the apex at L2-3. Vertebrae: Old minor superior endplate compression deformity on the left at L4. Old minor superior endplate compression deformity more on the right at L2. No evidence of recent fracture. No focal bone lesion. Paraspinal and other soft tissues: Nonobstructing stone in the lower pole the right kidney. Aortic atherosclerosis. Disc levels: T12-L1: Minimal disc bulge.  No stenosis. L1-2: Disc degeneration with loss of disc height. Endplate osteophytes and mild bulging of the disc. No compressive stenosis. L2-3: Minimal disc bulge. Minimal facet hypertrophy. No compressive stenosis. L3-4: Mild disc bulge. Mild facet hypertrophy. Mild narrowing of the lateral recesses but no definite compressive stenosis. L4-5: Moderate disc bulge more prominent towards the left. Facet and ligamentous hypertrophy. Stenosis of both lateral recesses left more than right. Neural compression could occur at this level, particularly on the left. L5-S1: Distant left hemilaminectomy. Disc space narrowing. Endplate osteophytes and mild annular bulging. No central canal stenosis. Mild bony foraminal narrowing on the left. Sacroiliac joints are unremarkable considering age. IMPRESSION: No evidence of recent fracture. Old minor superior endplate depression on the left at L4. Old minor superior endplate depression at L2, more on the right. No advanced spinal stenosis. Multilevel degenerative disc disease and degenerative facet disease, typical for age. At L4-5, there is bilateral lateral recess narrowing left more than right. Neural compression could possibly occur at this level, particularly on the left. At L5-S1, there has been a  distant left hemilaminectomy. There is mild bony foraminal stenosis on the left at this level, not definitely compressive. Electronically Signed   By: Nelson Chimes M.D.   On: 03/06/2019 19:36   DG CHEST PORT 1 VIEW  Result Date: 03/10/2019 CLINICAL DATA:  Tachypnea.  Shortness of breath. EXAM: PORTABLE CHEST 1 VIEW COMPARISON:  06/17/2006 FINDINGS: Cardiomegaly. Chronic aortic atherosclerosis. Chronic interstitial lung markings. Possible venous hypertension with mild interstitial edema. No consolidation, collapse or visible effusion. IMPRESSION: Cardiomegaly. Aortic atherosclerosis. Chronic lung disease. Possible venous hypertension with mild  interstitial edema. Electronically Signed   By: Nelson Chimes M.D.   On: 03/10/2019 10:02   DG Knee Complete 4 Views Left  Result Date: 03/06/2019 CLINICAL DATA:  Recent fall with left knee pain, initial encounter EXAM: LEFT KNEE - COMPLETE 4+ VIEW COMPARISON:  None. FINDINGS: Partial knee replacement is noted laterally. Changes of prior fibular fracture are noted. Degenerative changes are noted in all 3 joint compartments. No joint effusion is seen. No acute fracture is seen. IMPRESSION: Chronic changes without acute abnormality. Electronically Signed   By: Inez Catalina M.D.   On: 03/06/2019 21:14   DG C-Arm 1-60 Min  Result Date: 03/07/2019 CLINICAL DATA:  Femur fracture EXAM: DG C-ARM 1-60 MIN CONTRAST:  None FLUOROSCOPY TIME:  Fluoroscopy Time:  3 minutes 40 seconds Number of Acquired Spot Images: 13 COMPARISON:  03/06/2019 FINDINGS: Thirteen low resolution intraoperative spot views of the left femur. Displaced proximal femoral shaft fracture. Evidence of prior hemiarthroplasty at the knee. Subsequent images demonstrate multiple plate and screw fixation of the proximal femoral fracture. Insertion of intramedullary rod and distal fixating screws with anatomic alignment. Fixating screw placed across the left femoral neck and head. IMPRESSION: Intraoperative  fluoroscopic assistance provided during surgical fixation of proximal left femur fracture Electronically Signed   By: Donavan Foil M.D.   On: 03/07/2019 19:03   ECHOCARDIOGRAM COMPLETE  Result Date: 03/08/2019   ECHOCARDIOGRAM REPORT   Patient Name:   MAYBLE HAHM Date of Exam: 03/08/2019 Medical Rec #:  [id number]      Height:       62.0 in Accession #:    [id number]     Weight:       217.6 lb Date of Birth:  Jun 03, 1928     BSA:          1.98 m Patient Age:    38 years       BP:           68/57 mmHg Patient Gender: F              HR:           95 bpm. Exam Location:  Inpatient Procedure: 2D Echo Indications:    Aortic Stenosis 424.1/135.0  History:        Patient has prior history of Echocardiogram examinations, most                 recent 10/06/2004. CHF; Risk Factors:Hypertension.  Sonographer:    Clayton Lefort RDCS (AE) Referring Phys: [phone] RALPH A NETTEY  Sonographer Comments: Limited patient mobility. IMPRESSIONS  1. Left ventricular ejection fraction, by visual estimation, is 60 to 65%. The left ventricle has normal function. There is moderately increased left ventricular hypertrophy.  2. Elevated left atrial pressure.  3. Global right ventricle has normal systolic function.The right ventricular size is normal.  4. Left atrial size was severely dilated.  5. Right atrial size was normal.  6. Moderate mitral annular calcification.  7. The mitral valve is normal in structure. No evidence of mitral valve regurgitation. No evidence of mitral stenosis.  8. The tricuspid valve is normal in structure.  9. The aortic valve has an indeterminant number of cusps. Aortic valve regurgitation is not visualized. Moderate aortic valve stenosis. 10. The pulmonic valve was not well visualized. Pulmonic valve regurgitation is not visualized. 11. The left ventricle has no regional wall motion abnormalities. FINDINGS  Left Ventricle: Left ventricular ejection fraction, by visual estimation, is 60 to 65%. The left  ventricle has  normal function. The left ventricle has no regional wall motion abnormalities. There is moderately increased left ventricular hypertrophy. Elevated left atrial pressure. Right Ventricle: The right ventricular size is normal.Global RV systolic function is has normal systolic function. Left Atrium: Left atrial size was severely dilated. Right Atrium: Right atrial size was normal in size Pericardium: There is no evidence of pericardial effusion. Mitral Valve: The mitral valve is normal in structure. Moderate mitral annular calcification. No evidence of mitral valve regurgitation. No evidence of mitral valve stenosis by observation. MV peak gradient, 19.2 mmHg. Tricuspid Valve: The tricuspid valve is normal in structure. Tricuspid valve regurgitation is trivial. Aortic Valve: The aortic valve has an indeterminant number of cusps. Aortic valve regurgitation is not visualized. Moderate aortic stenosis is present. Aortic valve mean gradient measures 20.0 mmHg. Aortic valve peak gradient measures 32.5 mmHg. Aortic valve area, by VTI measures 1.13 cm. Pulmonic Valve: The pulmonic valve was not well visualized. Pulmonic valve regurgitation is not visualized. Pulmonic regurgitation is not visualized. Aorta: The aortic root is normal in size and structure. Venous: The inferior vena cava was not well visualized. IAS/Shunts: No atrial level shunt detected by color flow Doppler. Additional Comments: Normal LV systolic function; moderate LVH; moderate AS (mean gradient 21 mmHg); mean gradient across MV elevated (11 mmHg) but no MS by pressure half time; severe LAE.  LEFT VENTRICLE PLAX 2D LVIDd:         3.40 cm  Diastology LVIDs:         3.28 cm  LV e' lateral:   5.44 cm/s LV PW:         1.77 cm  LV E/e' lateral: 31.6 LV IVS:        1.72 cm  LV e' medial:    7.51 cm/s LVOT diam:     1.80 cm  LV E/e' medial:  22.9 LV SV:         4 ml LV SV Index:   1.85 LVOT Area:     2.54 cm  RIGHT VENTRICLE RV Basal diam:  2.79 cm RV S prime:      13.90 cm/s TAPSE (M-mode): 2.1 cm LEFT ATRIUM              Index       RIGHT ATRIUM           Index LA diam:        4.40 cm  2.22 cm/m  RA Area:     11.50 cm LA Vol (A2C):   109.0 ml 55.01 ml/m RA Volume:   20.80 ml  10.50 ml/m LA Vol (A4C):   81.6 ml  41.18 ml/m LA Biplane Vol: 94.7 ml  47.79 ml/m  AORTIC VALVE AV Area (Vmax):    1.26 cm AV Area (Vmean):   1.14 cm AV Area (VTI):     1.13 cm AV Vmax:           285.00 cm/s AV Vmean:          214.000 cm/s AV VTI:            0.607 m AV Peak Grad:      32.5 mmHg AV Mean Grad:      20.0 mmHg LVOT Vmax:         141.50 cm/s LVOT Vmean:        95.450 cm/s LVOT VTI:          0.270 m LVOT/AV VTI ratio: 0.45  AORTA Ao Root diam:  2.80 cm MITRAL VALVE                         TRICUSPID VALVE MV Area (PHT): 3.19 cm              TR Peak grad:   15.8 mmHg MV Peak grad:  19.2 mmHg             TR Vmax:        266.00 cm/s MV Mean grad:  11.0 mmHg MV Vmax:       2.19 m/s              SHUNTS MV Vmean:      154.0 cm/s            Systemic VTI:  0.27 m MV VTI:        0.43 m                Systemic Diam: 1.80 cm MV PHT:        69.02 msec MV Decel Time: 238 msec MV E velocity: 172.00 cm/s 103 cm/s MV A velocity: 185.00 cm/s 70.3 cm/s MV E/A ratio:  0.93        1.5  Kirk Ruths MD Electronically signed by Kirk Ruths MD Signature Date/Time: 03/08/2019/2:43:58 PM    Final    CT Renal Stone Study  Addendum Date: 03/06/2019   ADDENDUM REPORT: 03/06/2019 20:07 ADDENDUM: Upon further evaluation, an acute displaced fracture deformity of the proximal left femur is seen. This involves the proximal left femoral shaft, just distal to the inter trochanteric region. Approximately 1/2 shaft width medial displacement of the distal fracture site is noted. Electronically Signed   By: Virgina Norfolk M.D.   On: 03/06/2019 20:07   Result Date: 03/06/2019 CLINICAL DATA:  Flank pain. EXAM: CT ABDOMEN AND PELVIS WITHOUT CONTRAST TECHNIQUE: Multidetector CT imaging of the abdomen and pelvis was  performed following the standard protocol without IV contrast. COMPARISON:  None. FINDINGS: Lower chest: Mild atelectasis is seen within the bilateral lung bases. Marked severity coronary artery calcification is seen. Hepatobiliary: Numerous punctate calcified granulomas are seen scattered throughout the liver. The gallbladder is not identified. Pancreas: Unremarkable. No pancreatic ductal dilatation or surrounding inflammatory changes. Spleen: The spleen is absent. Adrenals/Urinary Tract: Adrenal glands are unremarkable. Kidneys are normal, without focal lesions or hydronephrosis. A 7 mm nonobstructing renal stone is seen within the mid right kidney. Bladder is unremarkable. Stomach/Bowel: Stomach is within normal limits. The appendix is not identified. No evidence of bowel wall thickening, distention, or inflammatory changes. Noninflamed diverticula are seen throughout the sigmoid colon. Vascular/Lymphatic: Marked severity aortic atherosclerosis. No enlarged abdominal or pelvic lymph nodes. Reproductive: Status post hysterectomy. No adnexal masses. Other: A surgical scar is seen along the midline of the anterior abdominal wall Musculoskeletal: Multilevel degenerative changes seen throughout the lumbar spine. IMPRESSION: 1. 7 mm nonobstructing renal stone within the right kidney. 2. Absent spleen. 3. Sigmoid diverticulosis. Electronically Signed: By: Virgina Norfolk M.D. On: 03/06/2019 19:49   DG Hip Unilat W or Wo Pelvis 2-3 Views Left  Result Date: 03/06/2019 CLINICAL DATA:  Recent fall with left hip pain, initial encounter EXAM: DG HIP (WITH OR WITHOUT PELVIS) 2-3V LEFT COMPARISON:  None. FINDINGS: Pelvic ring is intact. There is a transverse fracture through the proximal femoral shaft approximately 2 cm below the intratrochanteric region. Femoral head is well seated. No soft tissue abnormality is noted. Posterior displacement of the distal fracture fragment  is seen. IMPRESSION: Transverse fracture as  described in the proximal femoral shaft. Electronically Signed   By: Inez Catalina M.D.   On: 03/06/2019 21:14   DG FEMUR MIN 2 VIEWS LEFT  Result Date: 03/07/2019 CLINICAL DATA:  Known left femoral fracture EXAM: LEFT FEMUR 2 VIEWS COMPARISON:  Films from the previous day FLUOROSCOPY TIME:  Radiation Exposure Index (as provided by the fluoroscopic device): Not available If the device does not provide the exposure index: Fluoroscopy Time:  3 minutes 40 seconds Number of Acquired Images:  13 FINDINGS: Initial images again demonstrate the proximal left femoral shaft fracture. Fixation sideplate is noted in the proximal femur with multiple fixation screws. Medullary rod is in place as well. Proximal and distal fixation screws are seen. The fracture fragments are in near anatomic alignment. IMPRESSION: Status post ORIF of proximal left femoral fracture Electronically Signed   By: Inez Catalina M.D.   On: 03/07/2019 19:01   DG FEMUR PORT MIN 2 VIEWS LEFT  Result Date: 03/07/2019 CLINICAL DATA:  Status post ORIF of left femoral fracture EXAM: LEFT FEMUR PORTABLE 2 VIEWS COMPARISON:  03/06/2019 FINDINGS: Interval placement of medullary rod with proximal and distal fixation screws is seen. A lateral fixation sideplate along the proximal femur is seen. Prior partial knee replacement is noted. The fracture fragments are in near anatomic alignment. Patellofemoral degenerative changes are seen. IMPRESSION: Status post ORIF of left femoral fracture. Electronically Signed   By: Inez Catalina M.D.   On: 03/07/2019 20:04    Assessment/Plan  1. Closed fracture of left femur, unspecified fracture morphology, sequela - S/P intramedullary nailing of the left femur on 1/8, continue Lovenox for a total of 30 days for DVT prophylaxis, - oxyCODONE (OXY IR/ROXICODONE) 5 MG immediate release tablet; Take 1 tablet (5 mg total) by mouth every 6 (six) hours as needed for severe pain.  Dispense: 30 tablet; Refill: 0 -Follow-up with  orthopedics in 1 week  2. Acute blood loss anemia S/P transfusion Lab Results  Component Value Date   HGB 8.8 (L) 03/13/2019  - will monitor  3. Mixed simple and mucopurulent chronic bronchitis (HCC) - no SOB, continue PRN albuterol, prednisone and Spiriva  4. Diastolic CHF, chronic (HCC) - stable, continue metoprolol tartrate, Lasix and metoprolol tartrate  5. Hypotension, unspecified hypotension type -BP 92/52, continue midodrine  6. Chronic pain after cancer treatment -Continue Butrans patch weekly, oxycodone 5 mg 1 tab every 6 hours PRN for pain  7. Obstructive sleep apnea on CPAP -CPAP at bedtime  8. Slow transit constipation - discontinue DOK 100 mg daily, start Senna-S 8.6-50 mg 2 tabs BID   Family/ staff Communication:  Discussed plan of care with resident and charge nurse.  Labs/tests ordered:  None  Goals of care:   Short-term care   Durenda Age, DNP, FNP-BC [facility] [phone] (Monday-Friday 8:00 a.m. - 5:00 p.m.) [phone] (after hours)

## 2019-03-21 ENCOUNTER — Encounter: Payer: Self-pay | Admitting: Adult Health

## 2019-03-21 DIAGNOSIS — I10 Essential (primary) hypertension: Secondary | ICD-10-CM | POA: Insufficient documentation

## 2019-03-21 DIAGNOSIS — I959 Hypotension, unspecified: Secondary | ICD-10-CM | POA: Insufficient documentation

## 2019-03-21 MED ORDER — OXYCODONE HCL 5 MG PO TABS: 5.0000 mg | ORAL_TABLET | Freq: Four times a day (QID) | ORAL | 0 refills | Status: AC | PRN

## 2019-03-24 ENCOUNTER — Inpatient Hospital Stay: Payer: Self-pay

## 2019-03-24 ENCOUNTER — Emergency Department (HOSPITAL_COMMUNITY): Payer: Medicare Other

## 2019-03-24 ENCOUNTER — Inpatient Hospital Stay (HOSPITAL_COMMUNITY): Payer: Medicare Other

## 2019-03-24 ENCOUNTER — Inpatient Hospital Stay (HOSPITAL_COMMUNITY)
Admission: EM | Admit: 2019-03-24 | Discharge: 2019-03-31 | DRG: 208 | Disposition: A | Discharge status: Rehabilitation Facility | Payer: Medicare Other | Source: Skilled Nursing Facility | Attending: Internal Medicine | Admitting: Internal Medicine

## 2019-03-24 ENCOUNTER — Encounter (HOSPITAL_COMMUNITY): Payer: Self-pay | Admitting: Pulmonary Disease

## 2019-03-24 DIAGNOSIS — K5901 Slow transit constipation: Secondary | ICD-10-CM | POA: Diagnosis not present

## 2019-03-24 DIAGNOSIS — E46 Unspecified protein-calorie malnutrition: Secondary | ICD-10-CM | POA: Diagnosis not present

## 2019-03-24 DIAGNOSIS — F329 Major depressive disorder, single episode, unspecified: Secondary | ICD-10-CM | POA: Diagnosis present

## 2019-03-24 DIAGNOSIS — R1310 Dysphagia, unspecified: Secondary | ICD-10-CM | POA: Diagnosis not present

## 2019-03-24 DIAGNOSIS — I9589 Other hypotension: Secondary | ICD-10-CM | POA: Diagnosis present

## 2019-03-24 DIAGNOSIS — J9611 Chronic respiratory failure with hypoxia: Secondary | ICD-10-CM | POA: Diagnosis not present

## 2019-03-24 DIAGNOSIS — Z91048 Other nonmedicinal substance allergy status: Secondary | ICD-10-CM

## 2019-03-24 DIAGNOSIS — I5033 Acute on chronic diastolic (congestive) heart failure: Secondary | ICD-10-CM | POA: Diagnosis present

## 2019-03-24 DIAGNOSIS — Z7952 Long term (current) use of systemic steroids: Secondary | ICD-10-CM | POA: Diagnosis not present

## 2019-03-24 DIAGNOSIS — I4891 Unspecified atrial fibrillation: Secondary | ICD-10-CM | POA: Diagnosis not present

## 2019-03-24 DIAGNOSIS — G4733 Obstructive sleep apnea (adult) (pediatric): Secondary | ICD-10-CM | POA: Diagnosis present

## 2019-03-24 DIAGNOSIS — Z9981 Dependence on supplemental oxygen: Secondary | ICD-10-CM | POA: Diagnosis not present

## 2019-03-24 DIAGNOSIS — T402X5A Adverse effect of other opioids, initial encounter: Secondary | ICD-10-CM | POA: Diagnosis present

## 2019-03-24 DIAGNOSIS — Z781 Physical restraint status: Secondary | ICD-10-CM

## 2019-03-24 DIAGNOSIS — I248 Other forms of acute ischemic heart disease: Secondary | ICD-10-CM | POA: Diagnosis not present

## 2019-03-24 DIAGNOSIS — I110 Hypertensive heart disease with heart failure: Secondary | ICD-10-CM | POA: Diagnosis present

## 2019-03-24 DIAGNOSIS — J9622 Acute and chronic respiratory failure with hypercapnia: Secondary | ICD-10-CM | POA: Diagnosis present

## 2019-03-24 DIAGNOSIS — E8809 Other disorders of plasma-protein metabolism, not elsewhere classified: Secondary | ICD-10-CM | POA: Diagnosis not present

## 2019-03-24 DIAGNOSIS — Z6835 Body mass index (BMI) 35.0-35.9, adult: Secondary | ICD-10-CM

## 2019-03-24 DIAGNOSIS — J9602 Acute respiratory failure with hypercapnia: Secondary | ICD-10-CM | POA: Diagnosis not present

## 2019-03-24 DIAGNOSIS — J690 Pneumonitis due to inhalation of food and vomit: Secondary | ICD-10-CM | POA: Diagnosis present

## 2019-03-24 DIAGNOSIS — R1311 Dysphagia, oral phase: Secondary | ICD-10-CM | POA: Diagnosis not present

## 2019-03-24 DIAGNOSIS — E872 Acidosis: Secondary | ICD-10-CM | POA: Diagnosis not present

## 2019-03-24 DIAGNOSIS — Z886 Allergy status to analgesic agent status: Secondary | ICD-10-CM

## 2019-03-24 DIAGNOSIS — Z87891 Personal history of nicotine dependence: Secondary | ICD-10-CM | POA: Diagnosis not present

## 2019-03-24 DIAGNOSIS — D509 Iron deficiency anemia, unspecified: Secondary | ICD-10-CM | POA: Diagnosis present

## 2019-03-24 DIAGNOSIS — I959 Hypotension, unspecified: Secondary | ICD-10-CM

## 2019-03-24 DIAGNOSIS — Z8543 Personal history of malignant neoplasm of ovary: Secondary | ICD-10-CM

## 2019-03-24 DIAGNOSIS — J9601 Acute respiratory failure with hypoxia: Secondary | ICD-10-CM | POA: Diagnosis not present

## 2019-03-24 DIAGNOSIS — G8929 Other chronic pain: Secondary | ICD-10-CM | POA: Diagnosis present

## 2019-03-24 DIAGNOSIS — J9621 Acute and chronic respiratory failure with hypoxia: Secondary | ICD-10-CM | POA: Diagnosis present

## 2019-03-24 DIAGNOSIS — S72002D Fracture of unspecified part of neck of left femur, subsequent encounter for closed fracture with routine healing: Secondary | ICD-10-CM | POA: Diagnosis not present

## 2019-03-24 DIAGNOSIS — J441 Chronic obstructive pulmonary disease with (acute) exacerbation: Secondary | ICD-10-CM | POA: Diagnosis not present

## 2019-03-24 DIAGNOSIS — Z66 Do not resuscitate: Secondary | ICD-10-CM | POA: Diagnosis not present

## 2019-03-24 DIAGNOSIS — Z515 Encounter for palliative care: Secondary | ICD-10-CM | POA: Diagnosis not present

## 2019-03-24 DIAGNOSIS — E669 Obesity, unspecified: Secondary | ICD-10-CM | POA: Diagnosis present

## 2019-03-24 DIAGNOSIS — R739 Hyperglycemia, unspecified: Secondary | ICD-10-CM | POA: Diagnosis not present

## 2019-03-24 DIAGNOSIS — I480 Paroxysmal atrial fibrillation: Secondary | ICD-10-CM | POA: Diagnosis not present

## 2019-03-24 DIAGNOSIS — Z20822 Contact with and (suspected) exposure to covid-19: Secondary | ICD-10-CM | POA: Diagnosis present

## 2019-03-24 DIAGNOSIS — J9690 Respiratory failure, unspecified, unspecified whether with hypoxia or hypercapnia: Secondary | ICD-10-CM | POA: Diagnosis present

## 2019-03-24 DIAGNOSIS — I4510 Unspecified right bundle-branch block: Secondary | ICD-10-CM | POA: Diagnosis present

## 2019-03-24 DIAGNOSIS — Z7982 Long term (current) use of aspirin: Secondary | ICD-10-CM | POA: Diagnosis not present

## 2019-03-24 DIAGNOSIS — Z8572 Personal history of non-Hodgkin lymphomas: Secondary | ICD-10-CM | POA: Diagnosis not present

## 2019-03-24 DIAGNOSIS — S7292XS Unspecified fracture of left femur, sequela: Secondary | ICD-10-CM

## 2019-03-24 DIAGNOSIS — R159 Full incontinence of feces: Secondary | ICD-10-CM | POA: Diagnosis not present

## 2019-03-24 DIAGNOSIS — R509 Fever, unspecified: Secondary | ICD-10-CM

## 2019-03-24 DIAGNOSIS — R911 Solitary pulmonary nodule: Secondary | ICD-10-CM | POA: Diagnosis present

## 2019-03-24 DIAGNOSIS — R5381 Other malaise: Secondary | ICD-10-CM | POA: Diagnosis not present

## 2019-03-24 DIAGNOSIS — I361 Nonrheumatic tricuspid (valve) insufficiency: Secondary | ICD-10-CM

## 2019-03-24 DIAGNOSIS — M4800 Spinal stenosis, site unspecified: Secondary | ICD-10-CM | POA: Diagnosis present

## 2019-03-24 DIAGNOSIS — G894 Chronic pain syndrome: Secondary | ICD-10-CM | POA: Diagnosis not present

## 2019-03-24 DIAGNOSIS — G9340 Encephalopathy, unspecified: Secondary | ICD-10-CM

## 2019-03-24 DIAGNOSIS — M84452D Pathological fracture, left femur, subsequent encounter for fracture with routine healing: Secondary | ICD-10-CM | POA: Diagnosis present

## 2019-03-24 DIAGNOSIS — Z7189 Other specified counseling: Secondary | ICD-10-CM | POA: Diagnosis not present

## 2019-03-24 DIAGNOSIS — E876 Hypokalemia: Secondary | ICD-10-CM | POA: Diagnosis not present

## 2019-03-24 DIAGNOSIS — K5903 Drug induced constipation: Secondary | ICD-10-CM | POA: Diagnosis not present

## 2019-03-24 DIAGNOSIS — I350 Nonrheumatic aortic (valve) stenosis: Secondary | ICD-10-CM | POA: Diagnosis present

## 2019-03-24 DIAGNOSIS — T380X5A Adverse effect of glucocorticoids and synthetic analogues, initial encounter: Secondary | ICD-10-CM | POA: Diagnosis not present

## 2019-03-24 DIAGNOSIS — J9612 Chronic respiratory failure with hypercapnia: Secondary | ICD-10-CM | POA: Diagnosis not present

## 2019-03-24 DIAGNOSIS — R32 Unspecified urinary incontinence: Secondary | ICD-10-CM | POA: Diagnosis not present

## 2019-03-24 DIAGNOSIS — D591 Autoimmune hemolytic anemia, unspecified: Secondary | ICD-10-CM | POA: Diagnosis present

## 2019-03-24 DIAGNOSIS — G629 Polyneuropathy, unspecified: Secondary | ICD-10-CM | POA: Diagnosis present

## 2019-03-24 LAB — URINALYSIS, ROUTINE W REFLEX MICROSCOPIC
Bilirubin Urine: NEGATIVE
Glucose, UA: NEGATIVE mg/dL
Ketones, ur: NEGATIVE mg/dL
Leukocytes,Ua: NEGATIVE
Nitrite: NEGATIVE
Protein, ur: 100 mg/dL — AB
Specific Gravity, Urine: 1.018 (ref 1.005–1.030)
pH: 5 (ref 5.0–8.0)

## 2019-03-24 LAB — COMPREHENSIVE METABOLIC PANEL
ALT: 50 U/L — ABNORMAL HIGH (ref 0–44)
AST: 57 U/L — ABNORMAL HIGH (ref 15–41)
Albumin: 3 g/dL — ABNORMAL LOW (ref 3.5–5.0)
Alkaline Phosphatase: 101 U/L (ref 38–126)
Anion gap: 12 (ref 5–15)
BUN: 18 mg/dL (ref 8–23)
CO2: 36 mmol/L — ABNORMAL HIGH (ref 22–32)
Calcium: 8.3 mg/dL — ABNORMAL LOW (ref 8.9–10.3)
Chloride: 91 mmol/L — ABNORMAL LOW (ref 98–111)
Creatinine, Ser: 0.95 mg/dL (ref 0.44–1.00)
GFR calc Af Amer: >60 mL/min (ref >60)
GFR calc non Af Amer: 53 mL/min — ABNORMAL LOW (ref >60)
Glucose, Bld: 119 mg/dL — ABNORMAL HIGH (ref 70–99)
Potassium: 4 mmol/L (ref 3.5–5.1)
Sodium: 139 mmol/L (ref 135–145)
Total Bilirubin: 1.1 mg/dL (ref 0.3–1.2)
Total Protein: 6 g/dL — ABNORMAL LOW (ref 6.5–8.1)

## 2019-03-24 LAB — URINE CULTURE: Culture: NO GROWTH

## 2019-03-24 LAB — BRAIN NATRIURETIC PEPTIDE: B Natriuretic Peptide: 2540.9 pg/mL — ABNORMAL HIGH (ref 0.0–100.0)

## 2019-03-24 LAB — POCT I-STAT 7, (LYTES, BLD GAS, ICA,H+H)
Acid-Base Excess: 11 mmol/L — ABNORMAL HIGH (ref 0.0–2.0)
Acid-Base Excess: 14 mmol/L — ABNORMAL HIGH (ref 0.0–2.0)
Bicarbonate: 40.1 mmol/L — ABNORMAL HIGH (ref 20.0–28.0)
Bicarbonate: 41.3 mmol/L — ABNORMAL HIGH (ref 20.0–28.0)
Calcium, Ion: 1.11 mmol/L — ABNORMAL LOW (ref 1.15–1.40)
Calcium, Ion: 1.07 mmol/L — ABNORMAL LOW (ref 1.15–1.40)
HCT: 30 % — ABNORMAL LOW (ref 36.0–46.0)
HCT: 38 % (ref 36.0–46.0)
Hemoglobin: 10.2 g/dL — ABNORMAL LOW (ref 12.0–15.0)
Hemoglobin: 12.9 g/dL (ref 12.0–15.0)
O2 Saturation: 88 %
O2 Saturation: 100 %
Patient temperature: 101.2
Patient temperature: 100.1
Potassium: 3.8 mmol/L (ref 3.5–5.1)
Potassium: 3.9 mmol/L (ref 3.5–5.1)
Sodium: 136 mmol/L (ref 135–145)
Sodium: 134 mmol/L — ABNORMAL LOW (ref 135–145)
TCO2: 42 mmol/L — ABNORMAL HIGH (ref 22–32)
TCO2: 43 mmol/L — ABNORMAL HIGH (ref 22–32)
pCO2 arterial: 84 mmHg (ref 32.0–48.0)
pCO2 arterial: 65.7 mmHg (ref 32.0–48.0)
pH, Arterial: 7.294 — ABNORMAL LOW (ref 7.350–7.450)
pH, Arterial: 7.41 (ref 7.350–7.450)
pO2, Arterial: 69 mmHg — ABNORMAL LOW (ref 83.0–108.0)
pO2, Arterial: 262 mmHg — ABNORMAL HIGH (ref 83.0–108.0)

## 2019-03-24 LAB — GLUCOSE, CAPILLARY
Glucose-Capillary: 118 mg/dL — ABNORMAL HIGH (ref 70–99)
Glucose-Capillary: 158 mg/dL — ABNORMAL HIGH (ref 70–99)
Glucose-Capillary: 103 mg/dL — ABNORMAL HIGH (ref 70–99)
Glucose-Capillary: 125 mg/dL — ABNORMAL HIGH (ref 70–99)
Glucose-Capillary: 129 mg/dL — ABNORMAL HIGH (ref 70–99)

## 2019-03-24 LAB — CBC WITH DIFFERENTIAL/PLATELET
Abs Immature Granulocytes: 0.08 10*3/uL — ABNORMAL HIGH (ref 0.00–0.07)
Basophils Absolute: 0.1 10*3/uL (ref 0.0–0.1)
Basophils Relative: 1 %
Eosinophils Absolute: 0 10*3/uL (ref 0.0–0.5)
Eosinophils Relative: 0 %
HCT: 33.1 % — ABNORMAL LOW (ref 36.0–46.0)
Hemoglobin: 9.7 g/dL — ABNORMAL LOW (ref 12.0–15.0)
Immature Granulocytes: 1 %
Lymphocytes Relative: 8 %
Lymphs Abs: 1.1 10*3/uL (ref 0.7–4.0)
MCH: 31.2 pg (ref 26.0–34.0)
MCHC: 29.3 g/dL — ABNORMAL LOW (ref 30.0–36.0)
MCV: 106.4 fL — ABNORMAL HIGH (ref 80.0–100.0)
Monocytes Absolute: 1.9 10*3/uL — ABNORMAL HIGH (ref 0.1–1.0)
Monocytes Relative: 14 %
Neutro Abs: 9.9 10*3/uL — ABNORMAL HIGH (ref 1.7–7.7)
Neutrophils Relative %: 76 %
Platelets: 450 10*3/uL — ABNORMAL HIGH (ref 150–400)
RBC: 3.11 MIL/uL — ABNORMAL LOW (ref 3.87–5.11)
RDW: 15.3 % (ref 11.5–15.5)
WBC: 13 10*3/uL — ABNORMAL HIGH (ref 4.0–10.5)
nRBC: 0.3 % — ABNORMAL HIGH (ref 0.0–0.2)

## 2019-03-24 LAB — CBC
HCT: 31.6 % — ABNORMAL LOW (ref 36.0–46.0)
Hemoglobin: 9.1 g/dL — ABNORMAL LOW (ref 12.0–15.0)
MCH: 30.5 pg (ref 26.0–34.0)
MCHC: 28.8 g/dL — ABNORMAL LOW (ref 30.0–36.0)
MCV: 106 fL — ABNORMAL HIGH (ref 80.0–100.0)
Platelets: 420 10*3/uL — ABNORMAL HIGH (ref 150–400)
RBC: 2.98 MIL/uL — ABNORMAL LOW (ref 3.87–5.11)
RDW: 15.3 % (ref 11.5–15.5)
WBC: 12.7 10*3/uL — ABNORMAL HIGH (ref 4.0–10.5)
nRBC: 0.5 % — ABNORMAL HIGH (ref 0.0–0.2)

## 2019-03-24 LAB — TROPONIN I (HIGH SENSITIVITY)
Troponin I (High Sensitivity): 113 ng/L (ref <18)
Troponin I (High Sensitivity): 263 ng/L (ref <18)

## 2019-03-24 LAB — ECHOCARDIOGRAM LIMITED
Height: 64 in
Weight: 3396.85 oz

## 2019-03-24 LAB — CREATININE, SERUM
Creatinine, Ser: 0.93 mg/dL (ref 0.44–1.00)
GFR calc Af Amer: >60 mL/min (ref >60)
GFR calc non Af Amer: 55 mL/min — ABNORMAL LOW (ref >60)

## 2019-03-24 LAB — RESPIRATORY PANEL BY RT PCR (FLU A&B, COVID)
Influenza A by PCR: NEGATIVE
Influenza B by PCR: NEGATIVE
SARS Coronavirus 2 by RT PCR: NEGATIVE

## 2019-03-24 LAB — APTT: aPTT: 40 seconds — ABNORMAL HIGH (ref 24–36)

## 2019-03-24 LAB — PROTIME-INR
INR: 1.3 — ABNORMAL HIGH (ref 0.8–1.2)
Prothrombin Time: 16.1 seconds — ABNORMAL HIGH (ref 11.4–15.2)

## 2019-03-24 LAB — STREP PNEUMONIAE URINARY ANTIGEN: Strep Pneumo Urinary Antigen: NEGATIVE

## 2019-03-24 LAB — POC SARS CORONAVIRUS 2 AG -  ED: SARS Coronavirus 2 Ag: NEGATIVE

## 2019-03-24 LAB — PROCALCITONIN: Procalcitonin: <0.1 ng/mL

## 2019-03-24 LAB — LACTIC ACID, PLASMA: Lactic Acid, Venous: 1.5 mmol/L (ref 0.5–1.9)

## 2019-03-24 LAB — MRSA PCR SCREENING: MRSA by PCR: NEGATIVE

## 2019-03-24 MED ORDER — ALTEPLASE 2 MG IJ SOLR
2.0000 mg | Freq: Once | INTRAMUSCULAR | Status: AC
  Administered 2019-03-24: 2 mg
  Filled 2019-03-24: qty 2

## 2019-03-24 MED ORDER — VANCOMYCIN HCL 2000 MG/400ML IV SOLN
2000.0000 mg | Freq: Once | INTRAVENOUS | Status: DC
  Filled 2019-03-24: qty 400

## 2019-03-24 MED ORDER — SODIUM CHLORIDE 0.9 % IV SOLN
2.0000 g | Freq: Two times a day (BID) | INTRAVENOUS | Status: AC
  Administered 2019-03-24 – 2019-03-30 (×14): 2 g via INTRAVENOUS
  Filled 2019-03-24 (×16): qty 2

## 2019-03-24 MED ORDER — ASPIRIN 325 MG PO TABS: 325.0000 mg | ORAL_TABLET | Freq: Every day | ORAL | Status: DC

## 2019-03-24 MED ORDER — PROPOFOL 1000 MG/100ML IV EMUL
5.0000 ug/kg/min | INTRAVENOUS | Status: DC
  Administered 2019-03-24: 10 ug/kg/min via INTRAVENOUS
  Administered 2019-03-24: 40 ug/kg/min via INTRAVENOUS
  Administered 2019-03-24: 80 ug/kg/min via INTRAVENOUS
  Administered 2019-03-24: 06:00:00 20 ug/kg/min via INTRAVENOUS
  Filled 2019-03-24 (×2): qty 100

## 2019-03-24 MED ORDER — SODIUM CHLORIDE 0.9% FLUSH
10.0000 mL | Freq: Two times a day (BID) | INTRAVENOUS | Status: DC
  Administered 2019-03-24 – 2019-03-27 (×7): 10 mL
  Administered 2019-03-27: 20 mL
  Administered 2019-03-28 – 2019-03-31 (×6): 10 mL

## 2019-03-24 MED ORDER — HEPARIN SODIUM (PORCINE) 5000 UNIT/ML IJ SOLN
5000.0000 [IU] | Freq: Three times a day (TID) | INTRAMUSCULAR | Status: DC
  Administered 2019-03-24 – 2019-03-30 (×17): 5000 [IU] via SUBCUTANEOUS
  Filled 2019-03-24 (×17): qty 1

## 2019-03-24 MED ORDER — ALBUTEROL SULFATE (2.5 MG/3ML) 0.083% IN NEBU: 3.0000 mL | INHALATION_SOLUTION | Freq: Four times a day (QID) | RESPIRATORY_TRACT | Status: DC | PRN

## 2019-03-24 MED ORDER — FENTANYL 2500MCG IN NS 250ML (10MCG/ML) PREMIX INFUSION: 0.0000 ug/h | INTRAVENOUS | Status: DC

## 2019-03-24 MED ORDER — ASPIRIN 300 MG RE SUPP
300.0000 mg | Freq: Once | RECTAL | Status: AC
  Administered 2019-03-24: 05:00:00 300 mg via RECTAL
  Filled 2019-03-24: qty 1

## 2019-03-24 MED ORDER — NOREPINEPHRINE 4 MG/250ML-% IV SOLN
INTRAVENOUS | Status: AC
  Administered 2019-03-24: 05:00:00 10 ug/min via INTRAVENOUS
  Filled 2019-03-24: qty 250

## 2019-03-24 MED ORDER — LACTATED RINGERS IV BOLUS: 2000.0000 mL | Freq: Once | INTRAVENOUS | Status: DC

## 2019-03-24 MED ORDER — LACTATED RINGERS IV BOLUS (SEPSIS): 1000.0000 mL | Freq: Once | INTRAVENOUS | Status: DC

## 2019-03-24 MED ORDER — NOREPINEPHRINE 4 MG/250ML-% IV SOLN
0.0000 ug/min | INTRAVENOUS | Status: DC
  Administered 2019-03-24: 14:00:00 2 ug/min via INTRAVENOUS
  Filled 2019-03-24: qty 250

## 2019-03-24 MED ORDER — TIOTROPIUM BROMIDE MONOHYDRATE 18 MCG IN CAPS: 18.0000 ug | ORAL_CAPSULE | Freq: Every day | RESPIRATORY_TRACT | Status: DC

## 2019-03-24 MED ORDER — ACETAMINOPHEN 650 MG RE SUPP
650.0000 mg | Freq: Once | RECTAL | Status: AC
  Administered 2019-03-24: 650 mg via RECTAL
  Filled 2019-03-24: qty 1

## 2019-03-24 MED ORDER — ETOMIDATE 2 MG/ML IV SOLN
30.0000 mg | Freq: Once | INTRAVENOUS | Status: AC
  Administered 2019-03-24: 30 mg via INTRAVENOUS

## 2019-03-24 MED ORDER — FENTANYL CITRATE (PF) 100 MCG/2ML IJ SOLN: 25.0000 ug | INTRAMUSCULAR | Status: DC | PRN

## 2019-03-24 MED ORDER — IOHEXOL 350 MG/ML SOLN
75.0000 mL | Freq: Once | INTRAVENOUS | Status: AC | PRN
  Administered 2019-03-24: 75 mL via INTRAVENOUS

## 2019-03-24 MED ORDER — SODIUM CHLORIDE 0.9% FLUSH: 10.0000 mL | INTRAVENOUS | Status: DC | PRN

## 2019-03-24 MED ORDER — ASPIRIN 81 MG PO CHEW
81.0000 mg | CHEWABLE_TABLET | Freq: Every day | ORAL | Status: DC
  Administered 2019-03-25: 81 mg
  Filled 2019-03-24: qty 1

## 2019-03-24 MED ORDER — FUROSEMIDE 10 MG/ML IJ SOLN
40.0000 mg | Freq: Every day | INTRAMUSCULAR | Status: DC
  Administered 2019-03-24 – 2019-03-25 (×2): 40 mg via INTRAVENOUS
  Filled 2019-03-24 (×2): qty 4

## 2019-03-24 MED ORDER — SODIUM CHLORIDE 0.9 % IV SOLN
2.0000 g | Freq: Once | INTRAVENOUS | Status: AC
  Administered 2019-03-24: 04:00:00 2 g via INTRAVENOUS
  Filled 2019-03-24: qty 2

## 2019-03-24 MED ORDER — MIDAZOLAM HCL 2 MG/2ML IJ SOLN
1.0000 mg | INTRAMUSCULAR | Status: DC | PRN
  Administered 2019-03-24 (×2): 2 mg via INTRAVENOUS
  Filled 2019-03-24 (×2): qty 2

## 2019-03-24 MED ORDER — FENTANYL 2500MCG IN NS 250ML (10MCG/ML) PREMIX INFUSION
0.0000 ug/h | INTRAVENOUS | Status: DC
  Administered 2019-03-24: 10:00:00 100 ug/h via INTRAVENOUS
  Administered 2019-03-24: 20:00:00 50 ug/h via INTRAVENOUS
  Filled 2019-03-24: qty 250

## 2019-03-24 MED ORDER — ASPIRIN 300 MG RE SUPP: 300.0000 mg | Freq: Once | RECTAL | Status: DC

## 2019-03-24 MED ORDER — LACTATED RINGERS IV BOLUS (SEPSIS)
1000.0000 mL | Freq: Once | INTRAVENOUS | Status: AC
  Administered 2019-03-24: 03:00:00 1000 mL via INTRAVENOUS

## 2019-03-24 MED ORDER — MIDAZOLAM 50MG/50ML (1MG/ML) PREMIX INFUSION: 0.0000 mg/h | INTRAVENOUS | Status: DC

## 2019-03-24 MED ORDER — FENTANYL CITRATE (PF) 100 MCG/2ML IJ SOLN
100.0000 ug | Freq: Once | INTRAMUSCULAR | Status: AC
  Administered 2019-03-24: 06:00:00 100 ug via INTRAVENOUS

## 2019-03-24 MED ORDER — CHLORHEXIDINE GLUCONATE 0.12% ORAL RINSE (MEDLINE KIT)
15.0000 mL | Freq: Two times a day (BID) | OROMUCOSAL | Status: DC
  Administered 2019-03-24 – 2019-03-31 (×10): 15 mL via OROMUCOSAL

## 2019-03-24 MED ORDER — HYDROCORTISONE NA SUCCINATE PF 100 MG IJ SOLR
100.0000 mg | Freq: Three times a day (TID) | INTRAMUSCULAR | Status: DC
  Administered 2019-03-24 – 2019-03-26 (×6): 100 mg via INTRAVENOUS
  Filled 2019-03-24 (×6): qty 2

## 2019-03-24 MED ORDER — PROPOFOL 10 MG/ML IV BOLUS
INTRAVENOUS | Status: AC
  Filled 2019-03-24: qty 20

## 2019-03-24 MED ORDER — FENTANYL CITRATE (PF) 100 MCG/2ML IJ SOLN
INTRAMUSCULAR | Status: AC
  Filled 2019-03-24: qty 2

## 2019-03-24 MED ORDER — VANCOMYCIN HCL IN DEXTROSE 1-5 GM/200ML-% IV SOLN: 1000.0000 mg | Freq: Once | INTRAVENOUS | Status: DC

## 2019-03-24 MED ORDER — FUROSEMIDE 10 MG/ML IJ SOLN
40.0000 mg | Freq: Once | INTRAMUSCULAR | Status: AC
  Administered 2019-03-24: 40 mg via INTRAVENOUS
  Filled 2019-03-24: qty 4

## 2019-03-24 MED ORDER — PANTOPRAZOLE SODIUM 40 MG IV SOLR
40.0000 mg | Freq: Every day | INTRAVENOUS | Status: DC
  Administered 2019-03-24 – 2019-03-25 (×2): 40 mg via INTRAVENOUS
  Filled 2019-03-24 (×2): qty 40

## 2019-03-24 MED ORDER — FUROSEMIDE 40 MG PO TABS: 60.0000 mg | ORAL_TABLET | Freq: Every day | ORAL | Status: DC

## 2019-03-24 MED ORDER — ROCURONIUM BROMIDE 50 MG/5ML IV SOLN
100.0000 mg | Freq: Once | INTRAVENOUS | Status: AC
  Administered 2019-03-24: 100 mg via INTRAVENOUS

## 2019-03-24 MED ORDER — ASPIRIN EC 81 MG PO TBEC: 81.0000 mg | DELAYED_RELEASE_TABLET | Freq: Every day | ORAL | Status: DC

## 2019-03-24 MED ORDER — METRONIDAZOLE IN NACL 5-0.79 MG/ML-% IV SOLN
500.0000 mg | Freq: Once | INTRAVENOUS | Status: AC
  Administered 2019-03-24: 500 mg via INTRAVENOUS
  Filled 2019-03-24: qty 100

## 2019-03-24 MED ORDER — CHLORHEXIDINE GLUCONATE CLOTH 2 % EX PADS
6.0000 | MEDICATED_PAD | Freq: Every day | CUTANEOUS | Status: DC
  Administered 2019-03-24 – 2019-03-31 (×8): 6 via TOPICAL

## 2019-03-24 MED ORDER — METOPROLOL TARTRATE 12.5 MG HALF TABLET: 12.5000 mg | ORAL_TABLET | Freq: Two times a day (BID) | ORAL | Status: DC

## 2019-03-24 MED ORDER — FENTANYL CITRATE (PF) 100 MCG/2ML IJ SOLN
25.0000 ug | INTRAMUSCULAR | Status: DC | PRN
  Administered 2019-03-24: 07:00:00 25 ug via INTRAVENOUS
  Administered 2019-03-24: 08:00:00 100 ug via INTRAVENOUS
  Filled 2019-03-24 (×2): qty 2

## 2019-03-24 MED ORDER — DULOXETINE HCL 60 MG PO CPEP
60.0000 mg | ORAL_CAPSULE | Freq: Every day | ORAL | Status: DC
  Filled 2019-03-24: qty 1

## 2019-03-24 MED ORDER — ORAL CARE MOUTH RINSE
15.0000 mL | OROMUCOSAL | Status: DC
  Administered 2019-03-24 – 2019-03-25 (×11): 15 mL via OROMUCOSAL

## 2019-03-24 MED ORDER — UMECLIDINIUM BROMIDE 62.5 MCG/INH IN AEPB
1.0000 | INHALATION_SPRAY | Freq: Every day | RESPIRATORY_TRACT | Status: DC
  Administered 2019-03-26 – 2019-03-31 (×6): 1 via RESPIRATORY_TRACT
  Filled 2019-03-24: qty 7

## 2019-03-24 NOTE — ED Provider Notes (Addendum)
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  Patient chronically wears 2 L of oxygen.  Nurse at [facility] reports that her oxygen sats continued to drop tonight and got as low as 69% on nasal cannula.  Placed on nonrebreather.  Reports that she has been less responsive today and has had a cough.  No fever there.  Here patient has a fever of 101.2.  [facility] reports several Covid positive patients in their facility.  Patient did have surgery for a left proximal transverse femur fracture and is status post intramedullary nail on 03/07/2019 by Dr. Marcelino Scot.  Was discharged to [facility] on 03/13/2019.  Prior to being in the hospital, patient was living at home with her daughter.  Daughter denies any history of dementia.  Patient has no DNR or POA or advanced directives per daughter.  Daughter Jacqlyn Larsen [phone] (home); [phone] (cell)   Echo 03/08/2019:  IMPRESSIONS    1. Left ventricular ejection fraction, by visual estimation, is 60 to 65%. The left ventricle has normal function. There is moderately increased left ventricular hypertrophy.  2. Elevated left atrial pressure.  3. Global right ventricle has normal systolic function.The right ventricular size is normal.  4. Left atrial size was severely dilated.  5. Right atrial size was normal.  6. Moderate mitral annular calcification.  7. The mitral valve is normal in structure. No evidence of mitral valve regurgitation. No evidence of mitral stenosis.  8. The tricuspid valve is normal in structure.  9. The aortic valve has an indeterminant number of cusps. Aortic valve regurgitation is not visualized. Moderate aortic valve stenosis. 10.  The pulmonic valve was not well visualized. Pulmonic valve regurgitation is not visualized. 11. The left ventricle has no regional wall motion abnormalities.   ROS: Level 5 caveat secondary to confusion  PAST MEDICAL HISTORY/PAST SURGICAL HISTORY:  Past Medical History:  Diagnosis Date  . Blood transfusion without reported diagnosis   . Cancer ([place])   . CHF (congestive heart failure) ([place])   . Hypertension   . Hypertension   . Lymphoma ([place])   . Sleep apnea     MEDICATIONS:  Prior to Admission medications   Medication Sig Start Date End Date Taking? Authorizing Provider  acetaminophen (TYLENOL) 500 MG tablet Take 500 mg by mouth every 6 (six) hours as needed.  05/15/11   [provider]  albuterol (PROAIR HFA) 108 (90 Base) MCG/ACT inhaler Inhale 2 puffs into the lungs every 6 (six) hours as needed for wheezing or shortness of breath.  04/12/17   [provider]  aspirin EC 81 MG tablet Take 81 mg by mouth daily.     [provider]  docusate sodium (COLACE) 100 MG capsule Take 200 mg by mouth daily.     [provider]  DULoxetine (CYMBALTA) 60 MG capsule Take 60 mg by mouth daily.  03/15/17   [provider]  enoxaparin (LOVENOX) 40 MG/0.4ML injection Inject 0.4 mLs (40 mg total) into the skin daily. 03/13/19 04/12/19  Caren Griffins, MD  esomeprazole (NEXIUM) 40 MG capsule Take 40 mg by mouth daily.  01/16/18   [provider]  folic acid (FOLVITE) 1 MG tablet Take 1 mg by mouth 3 (three)  times daily.  08/01/17   [provider]  furosemide (LASIX) 40 MG tablet Take 60 mg by mouth daily.  11/28/17   [provider]  loratadine (CLARITIN) 10 MG tablet Take 10 mg by mouth daily.    [provider]  metoprolol tartrate (LOPRESSOR) 25 MG tablet Take 12.5 mg by mouth 2 (two) times daily.  08/01/17   [provider]  midodrine (PROAMATINE) 10 MG tablet Take 1 tablet (10 mg total) by mouth 3 (three) times  daily with meals. 03/13/19   Caren Griffins, MD  Multiple Vitamins-Minerals (ICAPS AREDS 2 PO) Take 2 tablets by mouth daily.    [provider]  oxyCODONE (OXY IR/ROXICODONE) 5 MG immediate release tablet Take 1 tablet (5 mg total) by mouth every 6 (six) hours as needed for severe pain. 03/21/19   Medina-Vargas, Monina C, NP  OXYGEN Inhale 2 L into the lungs.    [provider]  pramipexole (MIRAPEX) 1 MG tablet Take 1 mg by mouth at bedtime.  02/05/18   [provider]  predniSONE (DELTASONE) 5 MG tablet Take 5 mg by mouth daily.  12/25/17   [provider]  sulfamethoxazole-trimethoprim (BACTRIM DS,SEPTRA DS) 800-160 MG tablet TAKE ONE TABLET BY MOUTH EVERY MONDAY, [place], AND FRIDAY 12/24/17   [provider]  tiotropium (SPIRIVA HANDIHALER) 18 MCG inhalation capsule Place 18 mcg into inhaler and inhale daily.  04/12/17   [provider]    ALLERGIES:  Allergies  Allergen Reactions  . Aleve [Naproxen] Rash  . Mobic [Meloxicam] Rash  . Tape Rash    SOCIAL HISTORY:  Social History   Tobacco Use  . Smoking status: Former Smoker    Types: Cigarettes    Quit date: 04/09/2003    Years since quitting: 15.9  . Smokeless tobacco: Never Used  Substance Use Topics  . Alcohol use: No    FAMILY HISTORY: Family History  Problem Relation Age of Onset  . Breast cancer Neg Hx     EXAM: BP (!) 83/66   Pulse 84   Temp (!) 101.2 F (38.4 C) (Oral)   Resp (!) 24   SpO2 97%  CONSTITUTIONAL: Alert and oriented to person.  Does not answer all questions or follow all commands.  Elderly, obese. HEAD: Normocephalic EYES: Conjunctivae clear, pupils appear equal, EOM appear intact ENT: normal nose; moist mucous membranes NECK: Supple, normal ROM CARD: RRR; S1 and S2 appreciated; no murmurs, no clicks, no rubs, no gallops RESP: Rhonchorous breath sounds diffusely.  No wheezing or rales.  Hypoxic on normal nasal cannula.  Currently on  nonrebreather.  Mild increased work of breathing with tachypnea. ABD/GI: Normal bowel sounds; non-distended; soft, non-tender, no rebound, no guarding, no peritoneal signs, no hepatosplenomegaly BACK:  The back appears normal, no sacral wounds RECTAL: Brown appearing stool without blood or melena, external hemorrhoids that are not actively bleeding EXT: Normal ROM in all joints; no deformity noted, no edema; no cyanosis, patient has several incision sites to the lateral left lower extremity that are clean, dry and intact without drainage or bleeding.  Sutures in place.  No redness, warmth, induration or fluctuance.  She does have some mild soft tissue swelling.  2+ DP pulses bilaterally. SKIN: Normal color for age and race; warm; no rash on exposed skin NEURO: Moves all extremities equally PSYCH: The patient's mood and manner are appropriate.   MEDICAL DECISION MAKING: Patient here with concerns for sepsis.  Initially patient febrile, tachypneic, hypoxic and hypotensive.  Patient had decreased responsiveness but was awake and would open eyes and answer some questions.  Will obtain labs, cultures, Covid swab, chest x-ray, urine.  Patient will need admission.  Will give 30 mL/kg IV fluid bolus and broad-spectrum antibiotics.  Differential includes pneumonia, COVID-19, UTI, bacteremia.  Also concern for possible PE given her recent admission and surgery with hypoxia.  Too unstable at this time for CT angio of the chest.  Discussed with patient's daughter Jacqlyn Larsen by phone.  She understands that patient is gravely ill.  She still would like patient to be a full code with aggressive treatment.  She states patient would not want to be a DNR/DNI.  ED PROGRESS: On reevaluation, patient seems to have improved and is now more responsive and conversant.  Her blood pressure is now in the 120s/70s with minimal IV fluids.  Chest x-ray looks volume overloaded versus COVID-19 infection.  Will slow fluids at this time.   Troponin and BNP pending.  She denies having any pain.   Called back to room as patient is less responsive.  ABG shows PCO2 greater than 80.  Will place on BiPAP but likely will need intubation.    On BiPAP, patient became hypotensive.   Mental status does seem to have improved and she is more responsive again.  BNP is 2500.  I am concerned that this is could be cardiac in nature as well given her cardiac history.  Her troponin is mildly elevated.  Will give rectal aspirin.  Will hold further IV fluids and give IV Lasix for volume overload.  Will start IV Levophed for hypotension.  Will intubate for airway protection given her altered mental status and respiratory failure with hypoxia and hypercapnia.  Patient on BiPAP for approximately 30 minutes prior to intubation.    Patient tolerated intubation well.  Intubated using etomidate and rocuronium.  Intubated by Quincy Carnes, PA-C.  I was at bedside during intubation.  Sedated with propofol.  Will place central line.  Will obtain CTA of the chest.  Rapid Covid is negative.  Repeat ABG pending.  Will also obtain CT of the head although encephalopathy likely secondary to hypercapnia and hypoxia.   Spoke with Dr. Wallis Bamberg with critical care.  ICU team will see patient for admission.    Repeat ABG improving.  Will increase rate to 22 and drop FiO2 to 50%.  Patient has been able to be weaned off Levophed.  Critical care at bedside states we can hold off on central line at this time.  Have offered to place in the ED.  We do have 2 good peripheral IVs.  PCR Covid and flu swabs negative.  Endotracheal tube has been retracted centimeter.   Patient's daughter has been updated by phone.   EKG Interpretation  Date/Time:  Monday March 24 2019 03:10:23 EST Ventricular Rate:  86 PR Interval:    QRS Duration: 139 QT Interval:  348 QTC Calculation: 417 R Axis:   65 Text Interpretation: Sinus rhythm Right bundle branch block No significant change since  last tracing other than rate is slower Confirmed by Pryor Curia [phone]) on 03/24/2019 3:44:25 AM        CRITICAL CARE Performed by: Cyril Mourning Jillian Warth   Total critical care time: 75 minutes  Critical care time was exclusive of separately billable procedures and treating other patients.  Critical care was necessary to treat or prevent imminent or life-threatening deterioration.  Critical care was time spent personally by me on the following activities: development  of treatment plan with patient and/or surrogate as well as nursing, discussions with consultants, evaluation of patient's response to treatment, examination of patient, obtaining history from patient or surrogate, ordering and performing treatments and interventions, ordering and review of laboratory studies, ordering and review of radiographic studies, pulse oximetry and re-evaluation of patient's condition.   NABELLA BRACEY was evaluated in Emergency Department on 03/24/2019 for the symptoms described in the history of present illness. She was evaluated in the context of the global COVID-19 pandemic, which necessitated consideration that the patient might be at risk for infection with the SARS-CoV-2 virus that causes COVID-19. Institutional protocols and algorithms that pertain to the evaluation of patients at risk for COVID-19 are in a state of rapid change based on information released by regulatory bodies including the CDC and federal and state organizations. These policies and algorithms were followed during the patient's care in the ED.  Patient was seen wearing N95, face shield, gloves, gown.    Shannell Mikkelsen, Delice Bison, DO 03/24/19 0602   6:40 AM  Patient continuing to wake up and needing to increase propofol.  Also given prn IV fentanyl.  In soft wrist restraints.  CT head shows no acute abnormality.  During CT of the chest, left AC IV infiltrated contrast.  She has a swollen area that is red but not warm.  Compartments are soft.   Still has strong left radial pulse.  Will place ice on this area.  I have placed 2 peripheral 20-gauge IVs in both ACs.  Dr. Duwayne Heck, critical care physician at bedside.  Agrees with holding off on central line at this time.  She is still off of Levophed.  BP labile.  Drop with increased sedation.  ICU team to order stress dose steroids due to chronic prednisone use.  Clinical picture seems more respiratory failure from CHF exacerbation than sepsis, PNA.  Holding on further IVF.  Rcvd approximately 1L in ED.  Patient is being diuresed.  Suspect encephalopathy due to resp failure.  ICU team agrees.   Angiocath insertion Performed by: Cyril Mourning Keelin Neville  Consent: Verbal consent obtained. Risks and benefits: risks, benefits and alternatives were discussed Time out: Immediately prior to procedure a "time out" was called to verify the correct patient, procedure, equipment, support staff and site/side marked as required.  Preparation: Patient was prepped and draped in the usual sterile fashion.  Vein Location: R AC  Ultrasound Guided  Gauge: 20  Normal blood return and flush without difficulty Patient tolerance: Patient tolerated the procedure well with no immediate complications.  Angiocath insertion Performed by: Cyril Mourning Canio Winokur  Consent: Verbal consent obtained. Risks and benefits: risks, benefits and alternatives were discussed Time out: Immediately prior to procedure a "time out" was called to verify the correct patient, procedure, equipment, support staff and site/side marked as required.  Preparation: Patient was prepped and draped in the usual sterile fashion.  Vein Location: L AC  Ultrasound Guided  Gauge: 20  Normal blood return and flush without difficulty Patient tolerance: Patient tolerated the procedure well with no immediate complications.        Calliope Delangel, Delice Bison, DO 03/24/19 [id number]    Sammie Denner, Delice Bison, DO 03/24/19 [id number]

## 2019-03-24 NOTE — H&P (Signed)
NAME:  Virginia Thompson, MRN:  [id number], DOB:  05-Jun-1928, LOS: 0 ADMISSION DATE:  03/24/2019, CONSULTATION DATE:  03/24/19 REFERRING MD: Ward  , CHIEF COMPLAINT:  Respiratory failure   Brief History   86 y.o. 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  Patient was febrile and had worsening respiratory failure so intubated in the ED. 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  Per EMS report CPAP machine was not working and oxygen saturations were 69%, patient arrived on 15 L nonrebreather.  Patient was initially febrile, hypotensive and mildly tachycardic and sepsis protocol initiated and patient was given approximately 750 cc IV fluid lactic acid within normal limits, covered with cefepime and Flagyl.  Work-up revealed pulmonary edema versus atypical infiltrates on chest x-ray, negative Covid-19 and influenza, bland UA.   ABG showed respiratory acidosis with pH of 7.2 and PCO2 of 84, BiPAP initiated for approximately 45 minutes however patient's mental status was worsening and she was intubated in the ED.  Blood pressure has been very labile, patient started on Levophed then held on blood pressure improved.  BNP very elevated and she was given Lasix 40 mg x 1. CT PE protocol and CT head ordered and PCCM consulted for admission.  Past Medical History   has a past medical history of Blood transfusion without reported diagnosis, Cancer ([place]), CHF (congestive heart failure) ([place]),  Hypertension, Hypertension, Lymphoma ([place]), and Sleep apnea.   Significant Hospital Events   1/25 Admit to PCCM  Consults:    Procedures:  1/25 ETT  Significant Diagnostic Tests:  1/25 CXR>>Cardiomegaly with small left pleural effusion and mild diffuse interstitial opacity, possibly indicating congestive heart failure. 1/25 CT head>>no acute findings 1/25 CTA chest>  Micro Data:  1/25 SARS-code-2 and influenza>> negative 1/25 BCx2>> 1/25 UC>>  Antimicrobials:   Cefepime 1/24- Flagyl 1/24 Vancomycin 1/24-   Interim history/subjective:  Pt was oriented at the time of ED arrival per report, 45 minutes trial on bipap without improvement, so was intubated   Objective   Blood pressure (!) 163/61, pulse 94, temperature 100 F (37.8 C), temperature source Rectal, resp. rate 20, height 5\' 4"  (1.626 m), SpO2 100 %.    Vent Mode: PRVC FiO2 (%):  [40 %-100 %] 100 % Set Rate:  [10 bmp-20 bmp] 20 bmp Vt Set:  [430 mL] 430 mL PEEP:  [5 cmH20-6 cmH20] 5 cmH20 Plateau Pressure:  [29 cmH20] 29 cmH20  No intake or output data in the 24 hours ending 03/24/19 0549 There were no vitals filed for this visit.  General: Elderly female, well-nourished intubated and sedated  HEENT: MM pink/moist, pupils responsive, right pupil appears abnormally shaped, possible coloboma Neuro: Not responsive on propofol and after paralytic CV: s1s2 rrr, no m/r/g PULM:  GI: soft, bsx4 active  Extremities: warm/dry, no edema  Skin: no rashes or lesions   Resolved Hospital Problem list     Assessment & Plan:   Acute on chronic hypoxic and hypercarbic respiratory failure requiring intubation, SIRS -Volume overload versus atypical  pneumonia vs PE in the setting of recent surgery -was receiving prophylactic lovenox at facility -Briefly required Levophed in the ED P: -Follow CT chest to evaluate for PE -Broad antibiotic coverage with Vanco/cefepime for now, can likely de-escalate as clinical picture  is clarified  -Received Lasix 60mg  in the ED, continue diuresis and monitor urine output -Check urine Legionella and urine strep -Start stress dose steroids in the setting of acute infection, chronic prednisone and etomidate for RSI -Trend procalcitonin, follow blood cultures --Maintain full vent support with SAT/SBT as tolerated -titrate Vent setting to maintain SpO2 greater than or equal to 90%. -HOB elevated 30 degrees. -Plateau pressures less than 30 cm H20.  -Follow chest x-ray, ABG prn.   -Bronchial hygiene and RT/bronchodilator protocol.      HFpEF, hypertension, hyperlipidemia -Last echo with EF of 30 to 65% P: -Continue diuresis, check repeat echo in the setting of possible volume overload and BNP greater than 2000 -Hold home antihypertensives   History of autoimmune hemolytic anemia and iron deficiency anemia -Hemoglobin stable, on prednisone 5 mg daily P: -Stress dose steroids with Solu-Cortef 100 mg every 8  History of ovarian cancer -Status post hysterectomy P: -Nontraumatic recent left femur fracture, but does not appear that there is evidence of pathologic fracture on imaging  Recent nontraumatic left hip fracture S/P IM nailing -We will need continued PT and likely rehab after stabilization    Best practice:  Diet: N.p.o.  pain/Anxiety/Delirium protocol (if indicated): Fentanyl and propofol VAP protocol (if indicated): Yes  DVT prophylaxis: Heparin GI prophylaxis: Protonix Glucose control: Sliding scale insulin Mobility: Bedrest Code Status: Full code Family Communication: ED spoke with patient's daughter, will attempt to update also Disposition: ICU  Labs   CBC: Recent Labs  Lab 03/24/19 0255 03/24/19 0344 03/24/19 0541  WBC 13.0*  --   --   NEUTROABS 9.9*  --   --   HGB 9.7* 10.2* 12.9  HCT 33.1* 30.0* 38.0  MCV 106.4*  --   --   PLT 450*  --   --     Basic Metabolic Panel: Recent Labs  Lab 03/24/19 0255 03/24/19 0344  03/24/19 0541  NA 139 136 134*  K 4.0 3.8 3.9  CL 91*  --   --   CO2 36*  --   --   GLUCOSE 119*  --   --   BUN 18  --   --   CREATININE 0.95  --   --   CALCIUM 8.3*  --   --    GFR: Estimated Creatinine Clearance: 46.5 mL/min (by C-G formula based on SCr of 0.95 mg/dL). Recent Labs  Lab 03/24/19 0255  PROCALCITON <0.10  WBC 13.0*  LATICACIDVEN 1.5    Liver Function Tests: Recent Labs  Lab 03/24/19 0255  AST 57*  ALT 50*  ALKPHOS 101  BILITOT 1.1  PROT 6.0*  ALBUMIN 3.0*   No results for input(s): LIPASE, AMYLASE in the last 168 hours. No results for input(s): AMMONIA in the last 168 hours.  ABG    Component Value Date/Time   PHART 7.410 03/24/2019 0541   PCO2ART 65.7 (HH) 03/24/2019 0541   PO2ART 262.0 (H) 03/24/2019 0541   HCO3 41.3 (H) 03/24/2019 0541   TCO2 43 (H) 03/24/2019 0541   O2SAT 100.0 03/24/2019 0541     Coagulation Profile: Recent Labs  Lab 03/24/19 0255  INR 1.3*    Cardiac Enzymes: No results for input(s): CKTOTAL, CKMB, CKMBINDEX, TROPONINI in the last 168  hours.  HbA1C: No results found for: HGBA1C  CBG: No results for input(s): GLUCAP in the last 168 hours.  Review of Systems:   Unable to obtain 2/2 intubated  Past Medical History  She,  has a past medical history of Blood transfusion without reported diagnosis, Cancer ([place]), CHF (congestive heart failure) ([place]), Hypertension, Hypertension, Lymphoma ([place]), and Sleep apnea.   Surgical History    Past Surgical History:  Procedure Laterality Date  . ABDOMINAL HYSTERECTOMY    . APPENDECTOMY    . BACK SURGERY    . BREAST CYST ASPIRATION Bilateral   . CHOLECYSTECTOMY    . EYE SURGERY    . INTRAMEDULLARY (IM) NAIL INTERTROCHANTERIC Left 03/07/2019   Procedure: INTRAMEDULLARY (IM) NAIL INTERTROCHANTRIC;  Surgeon: Altamese Malibu, MD;  Location: [facility];  Service: Orthopedics;  Laterality: Left;  . JOINT REPLACEMENT Left   . SPLENECTOMY    . TONSILLECTOMY       Social History    reports that she quit smoking about 15 years ago. Her smoking use included cigarettes. She has never used smokeless tobacco. She reports that she does not drink alcohol or use drugs.   Family History   Her family history is negative for Breast cancer.   Allergies Allergies  Allergen Reactions  . Aleve [Naproxen] Rash  . Mobic [Meloxicam] Rash  . Tape Rash     Home Medications  Prior to Admission medications   Medication Sig Start Date End Date Taking? Authorizing Provider  acetaminophen (TYLENOL) 500 MG tablet Take 500 mg by mouth every 6 (six) hours as needed.  05/15/11   [provider]  albuterol (PROAIR HFA) 108 (90 Base) MCG/ACT inhaler Inhale 2 puffs into the lungs every 6 (six) hours as needed for wheezing or shortness of breath.  04/12/17   [provider]  aspirin EC 81 MG tablet Take 81 mg by mouth daily.     [provider]  docusate sodium (COLACE) 100 MG capsule Take 200 mg by mouth daily.     [provider]  DULoxetine (CYMBALTA) 60 MG capsule Take 60 mg by mouth daily.  03/15/17   [provider]  enoxaparin (LOVENOX) 40 MG/0.4ML injection Inject 0.4 mLs (40 mg total) into the skin daily. 03/13/19 04/12/19  Caren Griffins, MD  esomeprazole (NEXIUM) 40 MG capsule Take 40 mg by mouth daily.  01/16/18   [provider]  folic acid (FOLVITE) 1 MG tablet Take 1 mg by mouth 3 (three) times daily.  08/01/17   [provider]  furosemide (LASIX) 40 MG tablet Take 60 mg by mouth daily.  11/28/17   [provider]  loratadine (CLARITIN) 10 MG tablet Take 10 mg by mouth daily.    [provider]  metoprolol tartrate (LOPRESSOR) 25 MG tablet Take 12.5 mg by mouth 2 (two) times daily.  08/01/17   [provider]  midodrine (PROAMATINE) 10 MG tablet Take 1 tablet (10 mg total) by mouth 3 (three) times daily with meals. 03/13/19   Caren Griffins, MD  Multiple Vitamins-Minerals (ICAPS AREDS 2 PO) Take  2 tablets by mouth daily.    [provider]  oxyCODONE (OXY IR/ROXICODONE) 5 MG immediate release tablet Take 1 tablet (5 mg total) by mouth every 6 (six) hours as needed for severe pain. 03/21/19   Medina-Vargas, Monina C, NP  OXYGEN Inhale 2 L into the lungs.    [provider]  pramipexole (MIRAPEX) 1 MG tablet Take 1 mg by  mouth at bedtime.  02/05/18   [provider]  predniSONE (DELTASONE) 5 MG tablet Take 5 mg by mouth daily.  12/25/17   [provider]  sulfamethoxazole-trimethoprim (BACTRIM DS,SEPTRA DS) 800-160 MG tablet TAKE ONE TABLET BY MOUTH EVERY MONDAY, [place], AND FRIDAY 12/24/17   [provider]  tiotropium (SPIRIVA HANDIHALER) 18 MCG inhalation capsule Place 18 mcg into inhaler and inhale daily.  04/12/17   [provider]     Critical care time: 55 minutes     CRITICAL CARE Performed by: Otilio Carpen Sherry Rogus   Total critical care time: 55 minutes  Critical care time was exclusive of separately billable procedures and treating other patients.  Critical care was necessary to treat or prevent imminent or life-threatening deterioration.  Critical care was time spent personally by me on the following activities: development of treatment plan with patient and/or surrogate as well as nursing, discussions with consultants, evaluation of patient's response to treatment, examination of patient, obtaining history from patient or surrogate, ordering and performing treatments and interventions, ordering and review of laboratory studies, ordering and review of radiographic studies, pulse oximetry and re-evaluation of patient's condition.  Otilio Carpen Jese Comella, PA-C [facility]   [phone]

## 2019-03-24 NOTE — Progress Notes (Signed)
PT transported to 2m with no complications. 

## 2019-03-24 NOTE — Progress Notes (Signed)
Pharmacy Antibiotic Note  Virginia Thompson is a 86 y.o. female admitted on 03/24/2019 with hypoxia and started on cefepime and vancomycin for possible pneumonia and undergoing PE rule-out. Patient received cefepime 2 g for a one time dose in ED and no vancomycin give due to lack of central access. MRSA PCR this morning is negative. Pharmacy has been consulted for cefepime dosing and will not continue vancomyin.  Plan: Cefepime 2g IV Q12H Monitor clinical status, de-escalate and establish LOT  Height: 5\' 4"  (162.6 cm) Weight: 212 lb 4.9 oz (96.3 kg) IBW/kg (Calculated) : 54.7  Temp (24hrs), Avg:99.6 F (37.6 C), Min:98.1 F (36.7 C), Max:101.2 F (38.4 C)  Recent Labs  Lab 03/24/19 0255 03/24/19 0730  WBC 13.0* 12.7*  CREATININE 0.95 0.93  LATICACIDVEN 1.5  --     Estimated Creatinine Clearance: 47.1 mL/min (by C-G formula based on SCr of 0.93 mg/dL).    Allergies  Allergen Reactions  . Aleve [Naproxen] Rash  . Mobic [Meloxicam] Rash  . Tape Rash    Antimicrobials this admission: Flagyl 1/24 x1 Cefepime 1/24 >>  Microbiology results: 1/25 COVID/Flu: negative 1/25 MRSA PCR: negative 1/25 UCx: sent 1/25 BCx x2: sent    Virginia Mesina L. Devin Going, [place] PGY1 Pharmacy Resident [phone] 03/24/19      1:01 PM  Please check AMION for all [facility] phone numbers After 10:00 PM, call the [facility] [phone]

## 2019-03-24 NOTE — Progress Notes (Signed)
PCCM Interval Progress Note  1. Called by RN for agitation despite 80 propofol and PRN fentanyl x 2 doses.  -- Will start fentanyl infusion and add PRN versed.  2.  Labile BP's - was on low dose levo then became hypertensive; therefore, levo stopped.  Since then, has had up and down BP's. -- Will try and get adequate sedation and see how pressure does.  Can continue low dose levo if needed.  Will revisit later today and assess whether she will need CVL or not.   Montey Hora, [facility] 03/24/2019, 10:11 AM

## 2019-03-24 NOTE — ED Provider Notes (Signed)
INTUBATION Performed by: Larene Pickett  Required items: required blood products, implants, devices, and special equipment available Patient identity confirmed: provided demographic data and hospital-assigned identification number Time out: Immediately prior to procedure a "time out" was called to verify the correct patient, procedure, equipment, support staff and site/side marked as required.  Indications: airway protection, AMS  Intubation method: Glidescope Laryngoscopy   Preoxygenation: BVM  Sedatives: 30 Etomidate Paralytic: 100 Rocuronium  Tube Size: 7.5 cuffed  Post-procedure assessment: chest rise and ETCO2 monitor Breath sounds: equal and absent over the epigastrium Tube secured with: ETT holder Chest x-ray interpreted by radiologist and me.  Chest x-ray findings: endotracheal tube in appropriate position  Patient tolerated the procedure well with no immediate complications.      Larene Pickett, PA-C 03/24/19 [place], Delice Bison, DO 03/24/19 [phone]

## 2019-03-24 NOTE — Progress Notes (Signed)
Pt transported from [id number] to CT and back w/o incident

## 2019-03-24 NOTE — Progress Notes (Signed)
Pt only IV pulled out accidentally while IV  team was working with her to put a PICC. Currently IV Team in there putting a PICC line in. All drips are stopped and PT is stable.

## 2019-03-24 NOTE — Progress Notes (Signed)
Peripherally Inserted Central Catheter/Midline Placement  The IV Nurse has discussed with the patient and/or persons authorized to consent for the patient, the purpose of this procedure and the potential benefits and risks involved with this procedure.  The benefits include less needle sticks, lab draws from the catheter, and the patient may be discharged home with the catheter. Risks include, but not limited to, infection, bleeding, blood clot (thrombus formation), and puncture of an artery; nerve damage and irregular heartbeat and possibility to perform a PICC exchange if needed/ordered by physician.  Alternatives to this procedure were also discussed.  Bard Power PICC patient education guide, fact sheet on infection prevention and patient information card has been provided to patient /or left at bedside.    PICC/Midline Placement Documentation  PICC Double Lumen 03/24/19 PICC Right Brachial (Active)    Consent obtained via telephone from daughter, Trinda Pascal.   Bethena Roys 03/24/2019, 2:38 PM

## 2019-03-24 NOTE — Progress Notes (Signed)
Patient with newly placed PICC, unable to flush or aspirate from purple port post CT.  PICC in good placement on CT.  Will order TPA and attempt to declot.

## 2019-03-24 NOTE — Progress Notes (Signed)
Spoke with bedside nurse regarding PICC.  Patient is currently getting ECHO at this time.  Due to volume of PICCs, PICC RN will need to move to the next and follow up afterwards.  Recommended placement of CVL if central access is needed urgently or IV team consult for more IV access if PIV can take care of current needs.

## 2019-03-24 NOTE — ED Triage Notes (Signed)
Pt arrived [facility] from [facility]. EMS reports that pt is on cpap and the cpap was not working. EMS reports Pt. Stats to 60's on nasal canula once she falls asleep. EMS put Pt on non rebreather at 15L because they stated she wiill de-stat once she goes to sleep. Pt is alert and oriented and denies any pain. Pt was previously in ht hospital approx a week ago for a left sided hip fracture.

## 2019-03-24 NOTE — ED Notes (Signed)
Patient daughter calling asking Jacqlyn Larsen Mets asking for an update [phone]

## 2019-03-24 NOTE — Progress Notes (Signed)
Notified  of need to order fluid bolus. Provider felt pt volume overloaded. Levophed ordered.

## 2019-03-24 NOTE — Progress Notes (Signed)
  Echocardiogram 2D Echocardiogram has been performed.  Virginia Thompson 03/24/2019, 11:51 AM

## 2019-03-25 DIAGNOSIS — J9601 Acute respiratory failure with hypoxia: Secondary | ICD-10-CM

## 2019-03-25 DIAGNOSIS — J9602 Acute respiratory failure with hypercapnia: Secondary | ICD-10-CM

## 2019-03-25 LAB — PHOSPHORUS: Phosphorus: 2.2 mg/dL — ABNORMAL LOW (ref 2.5–4.6)

## 2019-03-25 LAB — CBC
HCT: 28.6 % — ABNORMAL LOW (ref 36.0–46.0)
Hemoglobin: 8.7 g/dL — ABNORMAL LOW (ref 12.0–15.0)
MCH: 30.5 pg (ref 26.0–34.0)
MCHC: 30.4 g/dL (ref 30.0–36.0)
MCV: 100.4 fL — ABNORMAL HIGH (ref 80.0–100.0)
Platelets: 377 10*3/uL (ref 150–400)
RBC: 2.85 MIL/uL — ABNORMAL LOW (ref 3.87–5.11)
RDW: 15.6 % — ABNORMAL HIGH (ref 11.5–15.5)
WBC: 10 10*3/uL (ref 4.0–10.5)
nRBC: 0.6 % — ABNORMAL HIGH (ref 0.0–0.2)

## 2019-03-25 LAB — BASIC METABOLIC PANEL
Anion gap: 11 (ref 5–15)
BUN: 20 mg/dL (ref 8–23)
CO2: 36 mmol/L — ABNORMAL HIGH (ref 22–32)
Calcium: 8.1 mg/dL — ABNORMAL LOW (ref 8.9–10.3)
Chloride: 94 mmol/L — ABNORMAL LOW (ref 98–111)
Creatinine, Ser: 0.76 mg/dL (ref 0.44–1.00)
GFR calc Af Amer: >60 mL/min (ref >60)
GFR calc non Af Amer: >60 mL/min (ref >60)
Glucose, Bld: 133 mg/dL — ABNORMAL HIGH (ref 70–99)
Potassium: 3.4 mmol/L — ABNORMAL LOW (ref 3.5–5.1)
Sodium: 141 mmol/L (ref 135–145)

## 2019-03-25 LAB — GLUCOSE, CAPILLARY
Glucose-Capillary: 120 mg/dL — ABNORMAL HIGH (ref 70–99)
Glucose-Capillary: 121 mg/dL — ABNORMAL HIGH (ref 70–99)
Glucose-Capillary: 131 mg/dL — ABNORMAL HIGH (ref 70–99)
Glucose-Capillary: 143 mg/dL — ABNORMAL HIGH (ref 70–99)
Glucose-Capillary: 154 mg/dL — ABNORMAL HIGH (ref 70–99)
Glucose-Capillary: 200 mg/dL — ABNORMAL HIGH (ref 70–99)

## 2019-03-25 LAB — LEGIONELLA PNEUMOPHILA SEROGP 1 UR AG: L. pneumophila Serogp 1 Ur Ag: NEGATIVE

## 2019-03-25 LAB — MAGNESIUM: Magnesium: 2.1 mg/dL (ref 1.7–2.4)

## 2019-03-25 LAB — PROCALCITONIN: Procalcitonin: 0.14 ng/mL

## 2019-03-25 MED ORDER — PRAMIPEXOLE DIHYDROCHLORIDE 1 MG PO TABS
1.0000 mg | ORAL_TABLET | Freq: Every day | ORAL | Status: DC
  Administered 2019-03-25 – 2019-03-30 (×6): 1 mg via ORAL
  Filled 2019-03-25 (×8): qty 1

## 2019-03-25 MED ORDER — DULOXETINE HCL 60 MG PO CPEP
60.0000 mg | ORAL_CAPSULE | Freq: Every day | ORAL | Status: DC
  Administered 2019-03-25 – 2019-03-31 (×7): 60 mg via ORAL
  Filled 2019-03-25: qty 1
  Filled 2019-03-25 (×2): qty 2
  Filled 2019-03-25: qty 1
  Filled 2019-03-25: qty 2
  Filled 2019-03-25 (×2): qty 1

## 2019-03-25 MED ORDER — FENTANYL CITRATE (PF) 100 MCG/2ML IJ SOLN: 12.5000 ug | INTRAMUSCULAR | Status: DC | PRN

## 2019-03-25 MED ORDER — MIDODRINE HCL 5 MG PO TABS
10.0000 mg | ORAL_TABLET | Freq: Three times a day (TID) | ORAL | Status: DC
  Administered 2019-03-25 – 2019-03-31 (×17): 10 mg via ORAL
  Filled 2019-03-25 (×17): qty 2

## 2019-03-25 MED ORDER — ASPIRIN 81 MG PO CHEW
81.0000 mg | CHEWABLE_TABLET | Freq: Every day | ORAL | Status: DC
  Administered 2019-03-26 – 2019-03-31 (×6): 81 mg via ORAL
  Filled 2019-03-25 (×6): qty 1

## 2019-03-25 MED ORDER — FUROSEMIDE 10 MG/ML IJ SOLN
40.0000 mg | Freq: Two times a day (BID) | INTRAMUSCULAR | Status: DC
  Administered 2019-03-25 – 2019-03-26 (×2): 40 mg via INTRAVENOUS
  Filled 2019-03-25 (×2): qty 4

## 2019-03-25 MED ORDER — POTASSIUM PHOSPHATES 15 MMOLE/5ML IV SOLN
20.0000 mmol | Freq: Once | INTRAVENOUS | Status: AC
  Administered 2019-03-25: 20 mmol via INTRAVENOUS
  Filled 2019-03-25: qty 6.67

## 2019-03-25 MED ORDER — CHLORHEXIDINE GLUCONATE 0.12 % MT SOLN
OROMUCOSAL | Status: AC
  Filled 2019-03-25: qty 15

## 2019-03-25 MED ORDER — OXYCODONE HCL 5 MG PO TABS
5.0000 mg | ORAL_TABLET | Freq: Four times a day (QID) | ORAL | Status: DC | PRN
  Administered 2019-03-25: 5 mg via ORAL
  Filled 2019-03-25: qty 1

## 2019-03-25 MED ORDER — MIDODRINE HCL 5 MG PO TABS: 10.0000 mg | ORAL_TABLET | Freq: Three times a day (TID) | ORAL | Status: DC

## 2019-03-25 MED ORDER — POTASSIUM CHLORIDE 20 MEQ/15ML (10%) PO SOLN
20.0000 meq | ORAL | Status: AC
  Administered 2019-03-25 (×2): 20 meq
  Filled 2019-03-25 (×2): qty 15

## 2019-03-25 MED ORDER — ORAL CARE MOUTH RINSE
15.0000 mL | Freq: Two times a day (BID) | OROMUCOSAL | Status: DC
  Administered 2019-03-26 – 2019-03-31 (×11): 15 mL via OROMUCOSAL

## 2019-03-25 MED ORDER — SENNOSIDES-DOCUSATE SODIUM 8.6-50 MG PO TABS
2.0000 | ORAL_TABLET | Freq: Two times a day (BID) | ORAL | Status: DC
  Administered 2019-03-25 – 2019-03-31 (×8): 2 via ORAL
  Filled 2019-03-25 (×11): qty 2

## 2019-03-25 MED ORDER — ACETAMINOPHEN 325 MG PO TABS
650.0000 mg | ORAL_TABLET | Freq: Four times a day (QID) | ORAL | Status: DC | PRN
  Administered 2019-03-25 – 2019-03-30 (×5): 650 mg via ORAL
  Filled 2019-03-25 (×5): qty 2

## 2019-03-25 NOTE — Evaluation (Signed)
Clinical/Bedside Swallow Evaluation Patient Details  Name: Virginia Thompson MRN: [id number] Date of Birth: 1928/03/18  Today's Date: 03/25/2019 Time: SLP Start Time (ACUTE ONLY): 1520 SLP Stop Time (ACUTE ONLY): 1550 SLP Time Calculation (min) (ACUTE ONLY): 30 min  Past Medical History:  Past Medical History:  Diagnosis Date  . Blood transfusion without reported diagnosis   . Cancer ([place])   . CHF (congestive heart failure) ([place])   . Hypertension   . Hypertension   . Lymphoma ([place])   . Sleep apnea    Past Surgical History:  Past Surgical History:  Procedure Laterality Date  . ABDOMINAL HYSTERECTOMY    . APPENDECTOMY    . BACK SURGERY    . BREAST CYST ASPIRATION Bilateral   . CHOLECYSTECTOMY    . EYE SURGERY    . INTRAMEDULLARY (IM) NAIL INTERTROCHANTERIC Left 03/07/2019   Procedure: INTRAMEDULLARY (IM) NAIL INTERTROCHANTRIC;  Surgeon: Altamese [address], MD;  Location: [facility];  Service: Orthopedics;  Laterality: Left;  . JOINT REPLACEMENT Left   . SPLENECTOMY    . 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  Patient was febrile and had worsening respiratory failure so intubated in the ED. Covid-19 negative   Assessment / Plan / Recommendation Clinical Impression  Pt seen at bedside for assessment of swallow function and identification of po diet. Pt was extubated this morning, after ~24 hours. Pt is awake and alert with strong clear voice quality and volitional cough. Oral care was completed with suction. Upper dentures were placed without difficulty. Pt accepted trials of ice chips, thin liquid, puree, and soft solid textures (pt reports needing soft solids without upper and lower dentures).  No obvious oral issues or overt s/s aspiration observed following any texture. Pt was  observed taking whole pills in puree, given by RN. She reports she prefers to take her meds in puree, crushed if able. Recommend beginning with dys 2 diet for energy conservation and due to absence of lower dentures. Thin liquids via cup or straw. Anticipate being able to advance solids with improved strength and endurance, as well as acquisition of lower dentures. Safe swallow precautions posted at [facility] and reviewed with pt. RN and MD informed. SLP will follow for diet tolerance and advancement.    SLP Visit Diagnosis: Dysphagia, unspecified (R13.10)    Aspiration Risk  Mild aspiration risk    Diet Recommendation Dysphagia 2 (Fine chop);Thin liquid   Liquid Administration via: Cup;Straw Medication Administration: Crushed with puree Supervision: Patient able to self feed;Intermittent supervision to cue for compensatory strategies Compensations: Slow rate;Small sips/bites Postural Changes: Seated upright at 90 degrees    Other  Recommendations Oral Care Recommendations: Oral care BID   Follow up Recommendations None      Frequency and Duration min 2x/week  2 weeks;1 week       Prognosis Prognosis for Safe Diet Advancement: Good      Swallow Study   General Date of Onset: 03/24/19 HPI: COPD and OSA on home CPAP, HFpEF, ovarian cancer hypertension and 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  Patient was febrile and had worsening respiratory failure so intubated in the ED. Covid-19 negative Type of Study: Bedside Swallow Evaluation Previous Swallow Assessment:  none Diet Prior to this Study: NPO Temperature Spikes Noted: No Respiratory Status: Nasal cannula History of Recent Intubation: Yes Length of Intubations (days): 1 days Date extubated: 03/25/19 Behavior/Cognition: Alert;Cooperative;Pleasant mood Oral Cavity Assessment: Within Functional Limits Oral Care  Completed by SLP: Yes Oral Cavity - Dentition: Dentures, top Vision: Functional for self-feeding Self-Feeding Abilities: Able to feed self Patient Positioning: Upright in bed Baseline Vocal Quality: Normal Volitional Cough: Strong Volitional Swallow: Able to elicit    Oral/Motor/Sensory Function Overall Oral Motor/Sensory Function: Within functional limits   Ice Chips Ice chips: Within functional limits Presentation: Spoon   Thin Liquid Thin Liquid: Within functional limits Presentation: Straw    Nectar Thick Nectar Thick Liquid: Not tested   Honey Thick Honey Thick Liquid: Not tested   Puree Puree: Within functional limits Presentation: Spoon   Solid     Solid: Within functional limits Presentation: Self Fed Other Comments: Pt has only upper dentures at this time, and is used to eating with them both (upper and lower) in place     [place] B. Quentin Ore, [facility], [place] Speech Language Pathologist Office: [phone] Pager: [phone]   Shonna Chock 03/25/2019,4:02 PM

## 2019-03-25 NOTE — Progress Notes (Signed)
[facility] ADULT ICU REPLACEMENT PROTOCOL FOR AM LAB REPLACEMENT ONLY  The patient does apply for the [facility] Adult ICU Electrolyte Replacment Protocol based on the criteria listed below:   1. Is GFR >/= 40 ml/min? Yes.    Patient's GFR today is >60 2. Is urine output >/= 0.5 ml/kg/hr for the last 6 hours? Yes.   Patient's UOP is 2.1 ml/kg/hr 3. Is BUN < 60 mg/dL?   Patient's BUN today is 20 4. Abnormal electrolyte(s): K+ 3.4 5. Ordered repletion with: protocol 6. If a panic level lab has been reported, has the CCM MD in charge been notified? Yes.  .   Physician:  Dr. Raliegh Scarlet, [place] 03/25/2019 5:46 AM

## 2019-03-25 NOTE — Progress Notes (Signed)
NAME:  Virginia Thompson, MRN:  [id number], DOB:  03/03/1928, LOS: 1 ADMISSION DATE:  03/24/2019, CONSULTATION DATE:  03/25/19 REFERRING MD: Ward  , CHIEF COMPLAINT:  Respiratory failure   Brief History   86 y.o. 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  Patient was febrile and had worsening respiratory failure so intubated in the ED. 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  Per EMS report CPAP machine was not working and oxygen saturations were 69%, patient arrived on 15 L nonrebreather.  Patient was initially febrile, hypotensive and mildly tachycardic and sepsis protocol initiated and patient was given approximately 750 cc IV fluid lactic acid within normal limits, covered with cefepime and Flagyl.  Work-up revealed pulmonary edema versus atypical infiltrates on chest x-ray, negative Covid-19 and influenza, bland UA.   ABG showed respiratory acidosis with pH of 7.2 and PCO2 of 84, BiPAP initiated for approximately 45 minutes however patient's mental status was worsening and she was intubated in the ED.  Blood pressure has been very labile, patient started on Levophed then held on blood pressure improved.  BNP very elevated and she was given Lasix 40 mg x 1. CT PE protocol and CT head ordered and PCCM consulted for admission.  Past Medical History   has a past medical history of Blood transfusion without reported diagnosis, Cancer ([place]), CHF (congestive heart failure) ([place]),  Hypertension, Hypertension, Lymphoma ([place]), and Sleep apnea.   Significant Hospital Events   1/25 Admit to PCCM  Consults:    Procedures:  1/25 ETT  Significant Diagnostic Tests:  1/25 CXR>>Cardiomegaly with small left pleural effusion and mild diffuse interstitial opacity, possibly indicating congestive heart failure. 1/25 CT head>>no acute findings 1/25 CTA chest> No PE, Bl upper lobe atelectasis /or infiltrate, bl lobe consolidation, and moderate bilateral pleural effusions.   Micro Data:  1/25 SARS-code-2 and influenza>> negative 1/25 BCx2>>Gram Positive Cocci on gram stain 1/4 blood cultures 1/25 UC>>No growth  Antimicrobials:  Flagyl 1/24- 1/25 Cefepime 1/24>> Interim history/subjective:  O/N: restraints renewed  Opens eyes on exam. Denies crushing substernal chest pain and radiation of pain. Endorses chronic chest pain and pain in left hip. Updated on her treatment plan and told team will update her daughter.    Objective   Blood pressure 95/62, pulse 93, temperature 98.5 F (36.9 C), temperature source Oral, resp. rate (!) 22, height 5\' 4"  (1.626 m), weight 93.9 kg, SpO2 100 %.    Vent Mode: PRVC FiO2 (%):  [40 %] 40 % Set Rate:  [22 bmp] 22 bmp Vt Set:  [430 mL] 430 mL PEEP:  [5 cmH20] 5 cmH20 Plateau Pressure:  [20 cmH20-24 cmH20] 22 cmH20   Intake/Output Summary (Last 24 hours) at 03/25/2019 [id number] Last data filed at 03/25/2019 0500 Gross per 24 hour  Intake 919.18 ml  Output 1977 ml  Net -1057.82 ml   Filed Weights   03/24/19 0705 03/25/19 0250  Weight: 96.3 kg 93.9 kg   General: Elderly women, NAD HEENT: [place]/AT, sclera antiicteric, trachea midline Cardiovascular:  RRR, 2/6 systolic blowing murmur Pulmonary : On vent, auscultate anteriorly, coarse breath sounds bilaterally. Abdominal: NT, hypoactive bowel sounds Genitourinary: Foley in place Musculoskeletal: No deformities,  Skin: warm, dry, no LEE  Neurological: Alert, moving  ext Psychiatric/Behavioral: cooperative, unable to further access while intubated  Resolved Hospital Problem list     Assessment & Plan:   #Acute on chronic hypoxic and hypercarbic respiratory failure requiring intubation ?Bacteremia - Possible patient had decrease in respirations with treatment of hip pain and began to retain CO2, pH 7.2 on admission with pCO2 84. Bilateral pleural effusions and possible pneumonia seen on imaging.  - Gram positive cocci on 1/4  blood cultures - urine strep negative, legionella pending  - Procal .14 P: -Continue cefepime for now -Received Lasix 60mg  in the ED, continue diuresis and monitor urine output - on stress dose steroids in the setting of acute infection -titrate Vent setting to maintain SpO2 greater than or equal to 90%. -Follow chest x-ray, ABG prn.   -Bronchial hygiene and RT/bronchodilator protocol. -HOB elevated 30 degrees. -Plateau pressures less than 30 cm H20.  - SBT, wean vent as tolerated    #HFpEF -Echo 1/25 , unchanged EF of 60 to 65%. Aortic stenois, grade 1 diastolic dysfunction. Elevated troponin. Likely due to demand ischemia. - Seems she has had soft bp at office visits. Noted 95/50. Dry weight ~207. - 93.9 today (207 lbs), bilateral pleural effusion. No right sided HF on exam.  P: -Lasix 40 mg BID -Hold home metoprolol.   #History of autoimmune hemolytic anemia and iron deficiency anemia -Hemoglobin stable, on prednisone 5 mg daily P: -Stress dose steroids with Solu-Cortef 100 mg every 8  #Recent nontraumatic left hip fracture S/P IM nailing #Chronic pain - neuropathy, spinal stenosis - per chart appears she was on chronic oxycodone 2.5mg  q8, recently on 5 mg q6 hrs for hip with plans to decrease -We will need continued PT and likely rehab after stabilization    Best practice:  Diet: NPO pain/Anxiety/Delirium protocol (if indicated): Fentanyl and propofol, wean as tolerated  VAP protocol (if indicated): Yes   DVT prophylaxis: Heparin GI prophylaxis: Protonix Glucose control: Sliding scale insulin Mobility: Bedrest Code Status: Full code Family Communication: Update daughter Disposition: ICU  Labs   CBC: Recent Labs  Lab 03/24/19 0255 03/24/19 0344 03/24/19 0541 03/24/19 0730 03/25/19 0349  WBC 13.0*  --   --  12.7* 10.0  NEUTROABS 9.9*  --   --   --   --   HGB 9.7* 10.2* 12.9 9.1* 8.7*  HCT 33.1* 30.0* 38.0 31.6* 28.6*  MCV 106.4*  --   --  106.0* 100.4*  PLT 450*  --   --  420* [id number]    Basic Metabolic Panel: Recent Labs  Lab 03/24/19 0255 03/24/19 0344 03/24/19 0541 03/24/19 0730 03/25/19 0349  NA 139 136 134*  --  141  K 4.0 3.8 3.9  --  3.4*  CL 91*  --   --   --  94*  CO2 36*  --   --   --  36*  GLUCOSE 119*  --   --   --  133*  BUN 18  --   --   --  20  CREATININE 0.95  --   --  0.93 0.76  CALCIUM 8.3*  --   --   --  8.1*  MG  --   --   --   --  2.1  PHOS  --   --   --   --  2.2*   GFR: Estimated Creatinine Clearance: 54 mL/min (by C-G formula based on SCr of 0.76 mg/dL). Recent Labs  Lab 03/24/19 0255 03/24/19 0730 03/25/19 0349  PROCALCITON <0.10  --  0.14  WBC 13.0* 12.7* 10.0  LATICACIDVEN 1.5  --   --     Liver Function Tests: Recent Labs  Lab 03/24/19 0255  AST 57*  ALT 50*  ALKPHOS 101  BILITOT 1.1  PROT 6.0*  ALBUMIN 3.0*   No results for input(s): LIPASE, AMYLASE in the last 168 hours. No results for input(s): AMMONIA in the last 168 hours.  ABG    Component Value Date/Time   PHART 7.410 03/24/2019 0541   PCO2ART 65.7 (HH) 03/24/2019 0541   PO2ART 262.0 (H) 03/24/2019 0541   HCO3 41.3 (H) 03/24/2019 0541   TCO2 43 (H) 03/24/2019 0541   O2SAT 100.0 03/24/2019 0541     Coagulation Profile: Recent Labs  Lab 03/24/19 0255  INR 1.3*    Cardiac Enzymes: No results for input(s): CKTOTAL, CKMB, CKMBINDEX, TROPONINI in the last 168 hours.  HbA1C: No results found for: HGBA1C  CBG: Recent Labs  Lab 03/24/19 1107  03/24/19 1502 03/24/19 1908 03/24/19 2302 03/25/19 0302  GLUCAP 158* 103* 125* 129* 120*    Review of Systems:   Unable to obtain 2/2 intubated  Past Medical History  She,  has a past medical history of Blood transfusion without reported diagnosis, Cancer ([place]), CHF (congestive heart failure) ([place]), Hypertension, Hypertension, Lymphoma ([place]), and Sleep apnea.   Surgical History    Past Surgical History:  Procedure Laterality Date  . ABDOMINAL HYSTERECTOMY    . APPENDECTOMY    . BACK SURGERY    . BREAST CYST ASPIRATION Bilateral   . CHOLECYSTECTOMY    . EYE SURGERY    . INTRAMEDULLARY (IM) NAIL INTERTROCHANTERIC Left 03/07/2019   Procedure: INTRAMEDULLARY (IM) NAIL INTERTROCHANTRIC;  Surgeon: Altamese Carthage, MD;  Location: [facility];  Service: Orthopedics;  Laterality: Left;  . JOINT REPLACEMENT Left   . SPLENECTOMY    . TONSILLECTOMY       Social History   reports that she quit smoking about 15 years ago. Her smoking use included cigarettes. She has never used smokeless tobacco. She reports that she does not drink alcohol or use drugs.   Family History   Her family history is negative for Breast cancer.   Allergies Allergies  Allergen Reactions  . Aleve [Naproxen] Rash  . Mobic [Meloxicam] Rash  . Tape Rash     Home Medications  Prior to Admission medications   Medication Sig Start Date End Date Taking? Authorizing Provider  acetaminophen (TYLENOL) 500 MG tablet Take 500 mg by mouth every 6 (six) hours as needed.  05/15/11   [provider]  albuterol (PROAIR HFA) 108 (90 Base) MCG/ACT inhaler Inhale 2 puffs into the lungs every 6 (six) hours as needed for wheezing or shortness of breath.  04/12/17   [provider]  aspirin EC 81 MG tablet Take 81 mg by mouth daily.     [provider]  docusate sodium (COLACE) 100 MG capsule Take 200 mg by mouth daily.     [provider]  DULoxetine (CYMBALTA) 60 MG capsule Take 60 mg by mouth daily.   03/15/17   [provider]  enoxaparin (LOVENOX) 40 MG/0.4ML injection Inject 0.4 mLs (40 mg total) into the skin daily. 03/13/19 04/12/19  Caren Griffins, MD  esomeprazole (NEXIUM) 40 MG capsule Take 40 mg by  mouth daily.  01/16/18   [provider]  folic acid (FOLVITE) 1 MG tablet Take 1 mg by mouth 3 (three) times daily.  08/01/17   [provider]  furosemide (LASIX) 40 MG tablet Take 60 mg by mouth daily.  11/28/17   [provider]  loratadine (CLARITIN) 10 MG tablet Take 10 mg by mouth daily.    [provider]  metoprolol tartrate (LOPRESSOR) 25 MG tablet Take 12.5 mg by mouth 2 (two) times daily.  08/01/17   [provider]  midodrine (PROAMATINE) 10 MG tablet Take 1 tablet (10 mg total) by mouth 3 (three) times daily with meals. 03/13/19   Caren Griffins, MD  Multiple Vitamins-Minerals (ICAPS AREDS 2 PO) Take 2 tablets by mouth daily.    [provider]  oxyCODONE (OXY IR/ROXICODONE) 5 MG immediate release tablet Take 1 tablet (5 mg total) by mouth every 6 (six) hours as needed for severe pain. 03/21/19   Medina-Vargas, Monina C, NP  OXYGEN Inhale 2 L into the lungs.    [provider]  pramipexole (MIRAPEX) 1 MG tablet Take 1 mg by mouth at bedtime.  02/05/18   [provider]  predniSONE (DELTASONE) 5 MG tablet Take 5 mg by mouth daily.  12/25/17   [provider]  sulfamethoxazole-trimethoprim (BACTRIM DS,SEPTRA DS) 800-160 MG tablet TAKE ONE TABLET BY MOUTH EVERY MONDAY, [place], AND FRIDAY 12/24/17   [provider]  tiotropium (SPIRIVA HANDIHALER) 18 MCG inhalation capsule Place 18 mcg into inhaler and inhale daily.  04/12/17   [provider]     Tamsen Snider, MD PGY1  See Attending Attestation Note for Final Recommendations.

## 2019-03-25 NOTE — Progress Notes (Signed)
eLink Physician-Brief Progress Note Patient Name: Virginia Thompson DOB: May 01, 1928 MRN: [id number]   Date of Service  03/25/2019  HPI/Events of Note  RN requesting additional PRN pain medication   eICU Interventions  Resume home oxyIR 5mg  PO q6h PRN pain      Intervention Category Minor Interventions: Routine modifications to care plan (e.g. PRN medications for pain, fever)  Darlina Sicilian 03/25/2019, 8:43 PM

## 2019-03-25 NOTE — Procedures (Signed)
Extubation Procedure Note  Patient Details:   Name: Virginia Thompson DOB: [birth year] MRN: [id number]   Airway Documentation:    Vent end date: 03/25/19 Vent end time: 1041   Evaluation  O2 sats: stable throughout Complications: No apparent complications Patient did tolerate procedure well. Bilateral Breath Sounds: Clear, Diminished   Yes   Pt extubated to 4L N/C.  No stridor noted.  RN @ bedside.  Donnetta Hail 03/25/2019, 10:42 AM

## 2019-03-25 NOTE — Progress Notes (Signed)
PHARMACY - PHYSICIAN COMMUNICATION CRITICAL VALUE ALERT - BLOOD CULTURE IDENTIFICATION (BCID)  Virginia Thompson is an 86 y.o. female who presented to [facility] on 03/24/2019 with a chief complaint of pna  Assessment: Pt growing GPC in 1/4 blood culture bottles. No BCID to be done unless other bottles grow. Likely contamination  Name of physician (or Provider) Contacted: Dr. Lucile Shutters  Current antibiotics: Cefepime  Changes to prescribed antibiotics recommended:  No changes needed  No results found for this or any previous visit.  Sherlon Handing, PharmD, BCPS Please see amion for complete clinical pharmacist phone list 03/25/2019  3:28 AM

## 2019-03-25 NOTE — Progress Notes (Signed)
[facility] Progress Note Patient Name: Virginia Thompson DOB: Nov 27, 1928 MRN: [id number]   Date of Service  03/25/2019  HPI/Events of Note  Bilateral soft wrist restraints need to be renewed  eICU Interventions  Restraints order renewed.        Kerry Kass Brentley Horrell 03/25/2019, 5:13 AM

## 2019-03-26 LAB — CULTURE, BLOOD (ROUTINE X 2): Special Requests: ADEQUATE

## 2019-03-26 LAB — BASIC METABOLIC PANEL
Anion gap: 9 (ref 5–15)
BUN: 24 mg/dL — ABNORMAL HIGH (ref 8–23)
CO2: 36 mmol/L — ABNORMAL HIGH (ref 22–32)
Calcium: 7.7 mg/dL — ABNORMAL LOW (ref 8.9–10.3)
Chloride: 92 mmol/L — ABNORMAL LOW (ref 98–111)
Creatinine, Ser: 0.7 mg/dL (ref 0.44–1.00)
GFR calc Af Amer: >60 mL/min (ref >60)
GFR calc non Af Amer: >60 mL/min (ref >60)
Glucose, Bld: 157 mg/dL — ABNORMAL HIGH (ref 70–99)
Potassium: 3.5 mmol/L (ref 3.5–5.1)
Sodium: 137 mmol/L (ref 135–145)

## 2019-03-26 LAB — GLUCOSE, CAPILLARY
Glucose-Capillary: 148 mg/dL — ABNORMAL HIGH (ref 70–99)
Glucose-Capillary: 136 mg/dL — ABNORMAL HIGH (ref 70–99)
Glucose-Capillary: 136 mg/dL — ABNORMAL HIGH (ref 70–99)
Glucose-Capillary: 125 mg/dL — ABNORMAL HIGH (ref 70–99)

## 2019-03-26 LAB — PROCALCITONIN: Procalcitonin: <0.1 ng/mL

## 2019-03-26 LAB — CBC
HCT: 27.3 % — ABNORMAL LOW (ref 36.0–46.0)
Hemoglobin: 8.2 g/dL — ABNORMAL LOW (ref 12.0–15.0)
MCH: 30.7 pg (ref 26.0–34.0)
MCHC: 30 g/dL (ref 30.0–36.0)
MCV: 102.2 fL — ABNORMAL HIGH (ref 80.0–100.0)
Platelets: 324 10*3/uL (ref 150–400)
RBC: 2.67 MIL/uL — ABNORMAL LOW (ref 3.87–5.11)
RDW: 15.7 % — ABNORMAL HIGH (ref 11.5–15.5)
WBC: 11.3 10*3/uL — ABNORMAL HIGH (ref 4.0–10.5)
nRBC: 0.4 % — ABNORMAL HIGH (ref 0.0–0.2)

## 2019-03-26 LAB — PHOSPHORUS: Phosphorus: 3.1 mg/dL (ref 2.5–4.6)

## 2019-03-26 LAB — MAGNESIUM: Magnesium: 2.3 mg/dL (ref 1.7–2.4)

## 2019-03-26 MED ORDER — BUPRENORPHINE 5 MCG/HR TD PTWK
1.0000 | MEDICATED_PATCH | TRANSDERMAL | Status: DC
  Administered 2019-03-28: 14:00:00 1 via TRANSDERMAL
  Filled 2019-03-26: qty 1

## 2019-03-26 MED ORDER — PANTOPRAZOLE SODIUM 40 MG PO TBEC
40.0000 mg | DELAYED_RELEASE_TABLET | Freq: Every day | ORAL | Status: DC
  Administered 2019-03-26 – 2019-03-31 (×6): 40 mg via ORAL
  Filled 2019-03-26 (×6): qty 1

## 2019-03-26 MED ORDER — FUROSEMIDE 40 MG PO TABS
60.0000 mg | ORAL_TABLET | Freq: Every day | ORAL | Status: DC
  Administered 2019-03-27 – 2019-03-29 (×3): 60 mg via ORAL
  Filled 2019-03-26 (×2): qty 1
  Filled 2019-03-26: qty 2

## 2019-03-26 MED ORDER — PREDNISONE 5 MG PO TABS
5.0000 mg | ORAL_TABLET | Freq: Every day | ORAL | Status: DC
  Administered 2019-03-27 – 2019-03-29 (×3): 5 mg via ORAL
  Filled 2019-03-26 (×3): qty 1

## 2019-03-26 MED ORDER — ALBUTEROL SULFATE (2.5 MG/3ML) 0.083% IN NEBU
3.0000 mL | INHALATION_SOLUTION | Freq: Four times a day (QID) | RESPIRATORY_TRACT | Status: DC
  Filled 2019-03-26: qty 3

## 2019-03-26 MED ORDER — OXYCODONE HCL 5 MG PO TABS
2.5000 mg | ORAL_TABLET | Freq: Four times a day (QID) | ORAL | Status: DC | PRN
  Administered 2019-03-26: 2.5 mg via ORAL
  Filled 2019-03-26: qty 1

## 2019-03-26 MED ORDER — POTASSIUM CHLORIDE CRYS ER 20 MEQ PO TBCR
30.0000 meq | EXTENDED_RELEASE_TABLET | Freq: Two times a day (BID) | ORAL | Status: AC
  Administered 2019-03-26 (×2): 30 meq via ORAL
  Filled 2019-03-26 (×2): qty 2

## 2019-03-26 MED ORDER — LEVALBUTEROL HCL 0.63 MG/3ML IN NEBU
INHALATION_SOLUTION | RESPIRATORY_TRACT | Status: AC
  Administered 2019-03-26: 0.63 mg
  Filled 2019-03-26: qty 3

## 2019-03-26 NOTE — Evaluation (Addendum)
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 ETT 1/25-1/26. CTA reveals No PE, Bl upper lobe atelectasis /or infiltrate, bl lobe consolidation, and moderate bilateral pleural effusions.  Clinical Impression  Patient presents with mobility limited due to pain, decreased strength, decreased AROM, decreased balance and limited activity tolerance.  She reports not getting much help at the SNF since d/c and states her daughter lives with her but can't help a whole lot due to her own back issues.  Currently needing mod A for about 6' ambulation with RW in the room on 3L O2.  Feel she would be excellent candidate for inpatient rehab prior to return home with daughter's assist/supervision.     Follow Up Recommendations CIR    Equipment Recommendations  None recommended by PT    Recommendations for Other Services Rehab consult     Precautions / Restrictions Precautions Precautions: Fall Restrictions Weight Bearing Restrictions: Yes LLE Weight Bearing: Weight bearing as tolerated      Mobility  Bed Mobility Overal bed mobility: Needs Assistance Bed Mobility: Supine to Sit     Supine to sit: Mod assist;+2 for physical assistance;+2 for safety/equipment;HOB elevated     General bed mobility comments: assist to initate and manage movement of BLEs towards EOB and trunk support, scooting foward with increased time   Transfers Overall transfer level: Needs assistance Equipment used: Rolling walker (2 wheeled)(youth RW) Transfers: Sit to/from Stand Sit to Stand: Mod assist;+2 physical assistance;+2 safety/equipment Stand pivot transfers: Mod assist;+2 physical  assistance;+2 safety/equipment       General transfer comment: assist for lifting and cues for R foot placement and anterior weight shift, L LE management once standing  Ambulation/Gait Ambulation/Gait assistance: Mod assist;+2 safety/equipment Gait Distance (Feet): 6 Feet Assistive device: Rolling walker (2 wheeled) Gait Pattern/deviations: Step-to pattern;Decreased stride length;Decreased stance time - left;Decreased step length - right     General Gait Details: initially unable to take actual steps with feet due to weakness, pain, then able to take steps with cues and using RW.  Stairs            Wheelchair Mobility    Modified Rankin (Stroke Patients Only)       Balance Overall balance assessment: Needs assistance Sitting-balance support: Feet supported;No upper extremity supported;Bilateral upper extremity supported Sitting balance-Leahy Scale: Fair Sitting balance - Comments: min assist fading to close supervision, preference to UE support   Standing balance support: Bilateral upper extremity supported;During functional activity Standing balance-Leahy Scale: Poor Standing balance comment: relaint on BUE and external support                             Pertinent Vitals/Pain Pain Assessment: Faces Pain Score: 6  Faces Pain Scale: Hurts a little bit Pain Location: L hip Pain Descriptors / Indicators: Discomfort;Grimacing;Guarding Pain Intervention(s): Monitored during session;Repositioned;Ice applied    Home Living Family/patient expects to be discharged to:: Inpatient rehab Living Arrangements: Children Available Help at Discharge: Family;Available 24 hours/day Type of Home: House Home Access: Ramped entrance     Home Layout: One level Home Equipment: Walker - 2 wheels;Walker - 4 wheels;Bedside commode;Wheelchair -  manual;Grab bars - tub/shower      Prior Function Level of Independence: Needs assistance   Gait / Transfers Assistance  Needed: uses RW for mobility at baseline, increased assistance at SNF since L femur IM nail  ADL's / Homemaking Assistance Needed: assist from dtr with IADL, bathing getting more difficult, increased assist at SNF since IM nail L femur        Hand Dominance   Dominant Hand: Right    Extremity/Trunk Assessment   Upper Extremity Assessment Upper Extremity Assessment: Defer to OT evaluation    Lower Extremity Assessment Lower Extremity Assessment: LLE deficits/detail LLE Deficits / Details: AAROM limited to about 50 degrees knee flexion, 3/5 knee extension strength, 2/5 hip flexion LLE Sensation: history of peripheral neuropathy    Cervical / Trunk Assessment Cervical / Trunk Assessment: Kyphotic  Communication   Communication: No difficulties  Cognition Arousal/Alertness: Awake/alert Behavior During Therapy: WFL for tasks assessed/performed Overall Cognitive Status: Within Functional Limits for tasks assessed                                        General Comments General comments (skin integrity, edema, etc.): 3L Emily during session, VSS and cues for pursed lip breathing    Exercises Total Joint Exercises Ankle Circles/Pumps: AROM;5 reps;Supine;Both Quad Sets: AROM;5 reps;Left;Supine Short Arc Quad: AROM;5 reps;Supine;Left Heel Slides: AROM;AAROM;5 reps;Supine;Both   Assessment/Plan    PT Assessment Patient needs continued PT services  PT Problem List Decreased strength;Decreased balance;Decreased mobility;Decreased activity tolerance;Decreased knowledge of use of DME;Cardiopulmonary status limiting activity;Pain       PT Treatment Interventions DME instruction;Therapeutic activities;Functional mobility training;Gait training;Therapeutic exercise;Balance training;Patient/family education    PT Goals (Current goals can be found in the Care Plan section)  Acute Rehab PT Goals Patient Stated Goal: to feel better PT Goal Formulation: With patient Time  For Goal Achievement: 04/09/19 Potential to Achieve Goals: Good    Frequency Min 3X/week   Barriers to discharge        Co-evaluation PT/OT/SLP Co-Evaluation/Treatment: Yes Reason for Co-Treatment: Necessary to address cognition/behavior during functional activity;To address functional/ADL transfers PT goals addressed during session: Mobility/safety with mobility;Balance;Proper use of DME OT goals addressed during session: ADL's and self-care       AM-PAC PT "6 Clicks" Mobility  Outcome Measure Help needed turning from your back to your side while in a flat bed without using bedrails?: A Lot Help needed moving from lying on your back to sitting on the side of a flat bed without using bedrails?: A Lot Help needed moving to and from a bed to a chair (including a wheelchair)?: A Lot Help needed standing up from a chair using your arms (e.g., wheelchair or bedside chair)?: A Lot Help needed to walk in hospital room?: Total Help needed climbing 3-5 steps with a railing? : Total 6 Click Score: 10    End of Session Equipment Utilized During Treatment: Gait belt;Oxygen Activity Tolerance: Patient tolerated treatment well Patient left: in chair;with call bell/phone within reach   PT Visit Diagnosis: Other abnormalities of gait and mobility (R26.89);Difficulty in walking, not elsewhere classified (R26.2);Pain Pain - Right/Left: Left Pain - part of body: Leg    Time: [id number] PT Time Calculation (min) (ACUTE ONLY): 35 min   Charges:   PT Evaluation $PT Eval Moderate Complexity: 1 Mod          Magda Kiel, PT Acute Rehabilitation  Services [phone] 03/26/2019   Reginia Naas 03/26/2019, 3:05 PM

## 2019-03-26 NOTE — Progress Notes (Signed)
  Speech Language Pathology Treatment: Dysphagia  Patient Details Name: Virginia Thompson MRN: [id number] DOB: Jul 30, 1928 Today's Date: 03/26/2019 Time: [id number] SLP Time Calculation (min) (ACUTE ONLY): 9 min  Assessment / Plan / Recommendation Clinical Impression  Pt was seen for follow up after BSE completed yesterday. Pt was seated upright in recliner with lunch tray in front of her. No overt s/s aspiration observed or reported on Dys2 solids and thin liquids. Pt still is without her lower dentures, and reports fatigue and poor appetite. SLP discussed the option of advancing solid textures, pt indicated dys 2 diet is good for now. SLP will continue to follow to assess diet tolerance, determine readiness to advance, and provide education. Safe swallow precautions posted at [facility].    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  Patient was febrile and had worsening respiratory failure so intubated in the ED. Covid-19 negative      SLP Plan  Continue with current plan of care       Recommendations  Diet recommendations: Dysphagia 2 (fine chop);Thin liquid Liquids provided via: Cup;Straw Medication Administration: Crushed with puree Supervision: Patient able to self feed Compensations: Slow rate;Small sips/bites Postural Changes and/or Swallow Maneuvers: Seated upright 90 degrees                Oral Care Recommendations: Oral care BID Follow up Recommendations: None SLP Visit Diagnosis: Dysphagia, unspecified (R13.10) Plan: Continue with current plan of care       GO          Chamari Cutbirth B. Quentin Ore, [facility], [place] Speech Language Pathologist Office: [phone] Pager: [phone]    Shonna Chock 03/26/2019, 2:15 PM

## 2019-03-26 NOTE — Evaluation (Signed)
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 ETT 1/25-1/26. CTA reveals No PE, Bl upper lobe atelectasis /or infiltrate, bl lobe consolidation, and moderate bilateral pleural effusions.    Clinical Impression   Patient admitted for above and limited by problem list below, including impaired balance, generalized weakness, decreased activity tolerance, L hip pain.  Patient reports using RW and completing ADLs with min assist for LB/bathing prior to L hip fracture, dc'd to SNF after fracture and requiring increased assist.  Patient reports unhappy with SNF and does not want to return to [facility].  Patient currently requires mod assist +2 for transfers using RW, min assist for UB ADLs and total assist +2 for toileting and LB ADLs.  Patient reports her daughter can provide 24/7 supervision, but not much physical assist. She will benefit from continued OT services while admitted and after dc at CIR level in order to optimize independence and safety with ADLs and mobility.     Follow Up Recommendations  CIR;Supervision/Assistance - 24 hour    Equipment Recommendations  3 in 1 bedside commode    Recommendations for Other Services Rehab consult     Precautions / Restrictions Precautions Precautions: Fall Restrictions Weight Bearing Restrictions: Yes LLE Weight Bearing: Weight bearing as tolerated      Mobility Bed Mobility Overal bed mobility: Needs Assistance Bed Mobility: Supine to Sit     Supine to sit: Mod assist;+2 for physical assistance;+2 for safety/equipment     General bed mobility comments: assist to initate and manage movement of BLEs towards  EOB and trunk support, scooting foward with increased time   Transfers Overall transfer level: Needs assistance Equipment used: Rolling walker (2 wheeled) Transfers: Sit to/from [facility] Sit to Stand: Mod assist;+2 physical assistance;+2 safety/equipment Stand pivot transfers: Mod assist;+2 physical assistance;+2 safety/equipment       General transfer comment: sit to stand from EOB and recliner with mod assist +2 to power up and steady with cueing for hand placement, technique and safety     Balance Overall balance assessment: Needs assistance Sitting-balance support: Feet supported;No upper extremity supported;Bilateral upper extremity supported Sitting balance-Leahy Scale: Fair Sitting balance - Comments: min assist fading to close supervision, preference to UE support   Standing balance support: Bilateral upper extremity supported;During functional activity Standing balance-Leahy Scale: Poor Standing balance comment: relaint on BUE and external support                           ADL either performed or assessed with clinical judgement   ADL Overall ADL's : Needs assistance/impaired     Grooming: Set up;Sitting   Upper Body Bathing: Minimal assistance;Sitting   Lower Body Bathing: Total assistance;+2 for physical assistance;Sit to/from stand   Upper Body Dressing : Minimal assistance;Sitting   Lower Body Dressing: Total assistance;+2 for physical assistance;Sit to/from stand Lower Body Dressing Details (indicate cue type and reason): to don socks, mod assist +2 sit to stand  Toilet Transfer: Moderate assistance;+2 for physical assistance;+2 for safety/equipment;Stand-pivot;RW Toilet Transfer Details (indicate cue type and reason): simulated to recliner  Toileting- Clothing Manipulation and Hygiene: Total assistance;+2 for physical assistance;Sit  to/from stand Toileting - Water quality scientist Details (indicate cue type and reason): incontient  BM, total assist for hygiene      Functional mobility during ADLs: +2 for physical assistance;+2 for safety/equipment;Moderate assistance General ADL Comments: limited by weakness, pain in L hip, impaired balance and decreased activity tolerance     Vision   Vision Assessment?: No apparent visual deficits     Perception     Praxis      Pertinent Vitals/Pain Pain Assessment: Faces Faces Pain Scale: Hurts even more Pain Location: L hip Pain Descriptors / Indicators: Discomfort;Grimacing;Guarding Pain Intervention(s): Repositioned;Ice applied     Hand Dominance Right   Extremity/Trunk Assessment Upper Extremity Assessment Upper Extremity Assessment: Generalized weakness   Lower Extremity Assessment Lower Extremity Assessment: Defer to PT evaluation       Communication Communication Communication: No difficulties   Cognition Arousal/Alertness: Awake/alert Behavior During Therapy: WFL for tasks assessed/performed Overall Cognitive Status: Within Functional Limits for tasks assessed                                     General Comments  VSS during session, 3L [place]    Exercises     Shoulder Instructions      Home Living Family/patient expects to be discharged to:: Private residence Living Arrangements: Children Available Help at Discharge: Family;Available 24 hours/day Type of Home: House Home Access: Ramped entrance     Home Layout: One level     Bathroom Shower/Tub: Teacher, early years/pre: Standard     Home Equipment: Environmental consultant - 2 wheels;Walker - 4 wheels;Bedside commode;Wheelchair - manual;Grab bars - tub/shower          Prior Functioning/Environment Level of Independence: Needs assistance  Gait / Transfers Assistance Needed: uses RW for mobility at baseline, increased assistance since L femur IM nail  ADL's / Homemaking Assistance Needed: assist from dtr with IADL, bathing getting more difficult, increased assist since IM  nail L femur            OT Problem List: Decreased knowledge of use of DME or AE;Decreased range of motion;Decreased knowledge of precautions;Decreased activity tolerance;Cardiopulmonary status limiting activity;Impaired balance (sitting and/or standing);Pain;Decreased strength      OT Treatment/Interventions: Self-care/ADL training;Therapeutic exercise;Patient/family education;Balance training;Therapeutic activities;Energy conservation;DME and/or AE instruction    OT Goals(Current goals can be found in the care plan section) Acute Rehab OT Goals Patient Stated Goal: to feel better OT Goal Formulation: With patient Time For Goal Achievement: 04/09/19 Potential to Achieve Goals: Good  OT Frequency: Min 2X/week   Barriers to D/C:            Co-evaluation PT/OT/SLP Co-Evaluation/Treatment: Yes Reason for Co-Treatment: For patient/therapist safety;To address functional/ADL transfers   OT goals addressed during session: ADL's and self-care      AM-PAC OT "6 Clicks" Daily Activity     Outcome Measure Help from another person eating meals?: A Little Help from another person taking care of personal grooming?: A Little Help from another person toileting, which includes using toliet, bedpan, or urinal?: Total Help from another person bathing (including washing, rinsing, drying)?: A Lot Help from another person to put on and taking off regular upper body clothing?: A Little Help from another person to put on and taking off regular lower body clothing?: Total 6 Click Score: 13   End of Session Equipment Utilized During Treatment: Gait belt;Rolling walker;Oxygen(3L) Nurse Communication: Mobility status  Activity Tolerance: Patient tolerated treatment well Patient left: in chair;with call bell/phone within reach;with nursing/sitter in room  OT Visit Diagnosis: Unsteadiness on feet (R26.81);Other abnormalities of gait and mobility (R26.89);Muscle weakness (generalized)  (M62.81);Pain Pain - Right/Left: Left Pain - part of body: Hip;Knee;Leg                Time: [id number] OT Time Calculation (min): 35 min Charges:  OT General Charges $OT Visit: 1 Visit OT Evaluation $OT Eval Moderate Complexity: 1 Mod  Jolaine Artist, OT Acute Rehabilitation Services Pager [phone] Office [phone]   Delight Stare 03/26/2019, 12:33 PM

## 2019-03-26 NOTE — Progress Notes (Signed)
Spoke to [facility], [facility], and his team will take over care on 1/28 @ 7am. We greatly appreciate their assistance with our patient.

## 2019-03-26 NOTE — Progress Notes (Addendum)
Rehab Admissions Coordinator Note:  Per OT recommendation, patient was screened by Michel Santee for appropriateness for an Inpatient Acute Rehab Consult.  At this time, we are recommending Inpatient Rehab consult.  If pt would like to be considered for CIR, please place an order so we may evaluate medical necessity.   Michel Santee 03/26/2019, 1:45 PM  I can be reached at [phone].

## 2019-03-26 NOTE — Progress Notes (Signed)
NAME:  Virginia Thompson, MRN:  [id number], DOB:  03-22-1928, LOS: 2 ADMISSION DATE:  03/24/2019, CONSULTATION DATE:  03/26/19 REFERRING MD: Ward  , CHIEF COMPLAINT:  Respiratory failure   Brief History   86 y.o. 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  Patient was febrile and had worsening respiratory failure so intubated in the ED. 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  Per EMS report CPAP machine was not working and oxygen saturations were 69%, patient arrived on 15 L nonrebreather.  Patient was initially febrile, hypotensive and mildly tachycardic and sepsis protocol initiated and patient was given approximately 750 cc IV fluid lactic acid within normal limits, covered with cefepime and Flagyl.  Work-up revealed pulmonary edema versus atypical infiltrates on chest x-ray, negative Covid-19 and influenza, bland UA.   ABG showed respiratory acidosis with pH of 7.2 and PCO2 of 84, BiPAP initiated for approximately 45 minutes however patient's mental status was worsening and she was intubated in the ED.  Blood pressure has been very labile, patient started on Levophed then held on blood pressure improved.  BNP very elevated and she was given Lasix 40 mg x 1. CT PE protocol and CT head ordered and PCCM consulted for admission.  Past Medical History   has a past medical history of Blood transfusion without reported diagnosis, Cancer ([place]), CHF (congestive heart failure) ([place]),  Hypertension, Hypertension, Lymphoma ([place]), and Sleep apnea.   Significant Hospital Events   1/25 Admit to PCCM 1/26 Extubated  Consults:    Procedures:  1/25 ETT  Significant Diagnostic Tests:  1/25 CXR>>Cardiomegaly with small left pleural effusion and mild diffuse interstitial opacity, possibly indicating congestive heart failure. 1/25 CT head>>no acute findings 1/25 CTA chest> No PE, Bl upper lobe atelectasis /or infiltrate, bl lobe consolidation, and moderate bilateral pleural effusions.   Micro Data:  1/25 SARS-code-2 and influenza>> negative 1/25 BCx2>>Gram Positive Cocci on gram stain 1/4 blood cultures 1/25 UC>>No growth  Antimicrobials:  Flagyl 1/24- 1/25 Cefepime 1/24>> Interim history/subjective:  O/N: PRN Oxy 5mg  for pain overnight   Reports she is doing well this morning. Continues to have hip pain. Denies abdominal pain and chest pain.   Objective   Blood pressure (!) 101/58, pulse 67, temperature 97.6 F (36.4 C), temperature source Oral, resp. rate 18, height 5\' 4"  (1.626 m), weight 94.3 kg, SpO2 97 %.    Vent Mode: CPAP;PSV FiO2 (%):  [40 %] 40 % Set Rate:  [22 bmp] 22 bmp Vt Set:  [430 mL] 430 mL PEEP:  [5 cmH20] 5 cmH20 Pressure Support:  [15 cmH20] 15 cmH20 Plateau Pressure:  [22 cmH20] 22 cmH20   Intake/Output Summary (Last 24 hours) at 03/26/2019 0606 Last data filed at 03/26/2019 0500 Gross per 24 hour  Intake 978.02 ml  Output 1821 ml  Net -842.98 ml   Filed Weights   03/24/19 0705 03/25/19 0250 03/26/19 0425  Weight: 96.3 kg 93.9 kg 94.3 kg   General: Elderly women, NAD HEENT: [place]/AT, sclera antiicteric, trachea midline Cardiovascular:  RRR, 2/6 systolic blowing murmur Pulmonary : wheezing in all lung fields  Abdominal: NT, hypoactive bowel sounds Genitourinary: Foley in place Musculoskeletal: No deformities,  Skin: warm, dry, no LEE   Neurological: Alert, moving ext Psychiatric/Behavioral: cooperative, unable to further access  while intubated  Resolved Hospital Problem list   Vent Support   Assessment & Plan:   #Acute on chronic hypoxic and hypercarbic respiratory failure  -Likely 2/2 to respiratory depression from recent opiod use after hip surgery in setting of OSA, COPD on home 2L supplemental 02, and HFpEF. - Extubated on 1/26. Satting 97% on 3L [place] this morning. - Gram positive cocci on 1/4  blood cultures, likely contaminate  - urine strep negative, legionella negative  - Procal .14 P: -Continue cefepime while awaiting culture growth - Scheduled duonebs - Continue Ellipta - PRN albuterol  - CT Angio Chest: 4 mm noncalcified left apical lung nodule. This represents a new finding when compared to the prior study dated August 01, 2006. Correlation with 12 month follow-up chest CT is recommended to determine stability.  #HFpEF -Echo 1/25 , unchanged EF of 60 to 65%. Aortic stenois, grade 1 diastolic dysfunction. Elevated troponin. Likely due to demand ischemia. - Seems she has had soft bp at office visits. Noted 95/50. Dry weight ~207.(93.8 kg) - 94.3 kg today. Weights do not match up with diuresis. Net negative ~1.8 L on admission.  P: - Stop IV Lasix 40 mg BID - Restart Home Lasix , PO Lasix 60 mg daily  - Hold home metoprolol.  - Continue Midodrine 10mg  TID  #History of autoimmune hemolytic anemia and iron deficiency anemia -Hemoglobin stable, on prednisone 5 mg daily P: - Stop solu-cortef -Restart home prednisone 5 mg  #Recent nontraumatic left hip fracture S/P IM nailing #Chronic pain - neuropathy, spinal stenosis - per chart appears she was on chronic oxycodone 2.5mg  q8, recently on 5 mg q6 hrs for hip with plans to decrease P: PT/OT - Start home Buprenorphine patch ( pharm checking on availability)  - PRN 2.5mg  Oxycodone q 8hrs for severe pain    Best practice:  Diet: Dys 2 diet pain/Anxiety/Delirium protocol (if indicated): na VAP protocol (if indicated): na DVT prophylaxis:  Heparin GI prophylaxis: Protonix Glucose control: Sliding scale insulin Mobility: Bedrest Code Status: Full code Family Communication: Update daughter Disposition: ICU  Labs   CBC: Recent Labs  Lab 03/24/19 0255 03/24/19 0255 03/24/19 0344 03/24/19 0541 03/24/19 0730 03/25/19 0349 03/26/19 0416  WBC 13.0*  --   --   --  12.7* 10.0 11.3*  NEUTROABS 9.9*  --   --   --   --   --   --   HGB 9.7*   < > 10.2* 12.9 9.1* 8.7* 8.2*  HCT 33.1*   < > 30.0* 38.0 31.6* 28.6* 27.3*  MCV 106.4*  --   --   --  106.0* 100.4* 102.2*  PLT 450*  --   --   --  420* 377 324   < > = values in this interval not displayed.    Basic Metabolic Panel: Recent Labs  Lab 03/24/19 0255 03/24/19 0344 03/24/19 0541 03/24/19 0730 03/25/19 0349 03/26/19 0416  NA 139 136 134*  --  141 137  K 4.0 3.8 3.9  --  3.4* 3.5  CL 91*  --   --   --  94* 92*  CO2 36*  --   --   --  36* 36*  GLUCOSE 119*  --   --   --  133* 157*  BUN 18  --   --   --  20 24*  CREATININE 0.95  --   --  0.93 0.76 0.70  CALCIUM 8.3*  --   --   --  8.1* 7.7*  MG  --   --   --   --  2.1 2.3  PHOS  --   --   --   --  2.2* 3.1   GFR: Estimated Creatinine Clearance: 54.1 mL/min (by C-G formula based on SCr of 0.7 mg/dL). Recent Labs  Lab 03/24/19 0255 03/24/19 0730 03/25/19 0349 03/26/19 0416  PROCALCITON <0.10  --  0.14 <0.10  WBC 13.0* 12.7* 10.0 11.3*  LATICACIDVEN 1.5  --   --   --     Liver Function Tests: Recent Labs  Lab 03/24/19 0255  AST 57*  ALT 50*  ALKPHOS 101  BILITOT 1.1  PROT 6.0*  ALBUMIN 3.0*   No results for input(s): LIPASE, AMYLASE in the last 168 hours. No results for input(s): AMMONIA in the last 168 hours.  ABG    Component Value Date/Time   PHART 7.410 03/24/2019 0541   PCO2ART 65.7 (HH) 03/24/2019 0541   PO2ART 262.0 (H) 03/24/2019 0541   HCO3 41.3 (H) 03/24/2019 0541   TCO2 43 (H) 03/24/2019 0541   O2SAT 100.0 03/24/2019 0541     Coagulation Profile: Recent Labs  Lab  03/24/19 0255  INR 1.3*    Cardiac Enzymes: No results for input(s): CKTOTAL, CKMB, CKMBINDEX, TROPONINI in the last 168 hours.  HbA1C: No results found for: HGBA1C  CBG: Recent Labs  Lab 03/25/19 1116 03/25/19 1509 03/25/19 1919 03/25/19 2343 03/26/19 0347  GLUCAP 154* 121* 200* 143* 148*    Review of Systems:   Unable to obtain 2/2 intubated  Past Medical History  She,  has a past medical history of Blood transfusion without reported diagnosis, Cancer ([place]), CHF (congestive heart failure) ([place]), Hypertension, Hypertension, Lymphoma ([place]), and Sleep apnea.   Surgical History    Past Surgical History:  Procedure Laterality Date  . ABDOMINAL HYSTERECTOMY    . APPENDECTOMY    . BACK SURGERY    . BREAST CYST ASPIRATION Bilateral   . CHOLECYSTECTOMY    . EYE SURGERY    . INTRAMEDULLARY (IM) NAIL INTERTROCHANTERIC Left 03/07/2019   Procedure: INTRAMEDULLARY (IM) NAIL INTERTROCHANTRIC;  Surgeon: Altamese Munford, MD;  Location: [facility];  Service: Orthopedics;  Laterality: Left;  . JOINT REPLACEMENT Left   . SPLENECTOMY    . TONSILLECTOMY       Social History   reports that she quit smoking about 15 years ago. Her smoking use included cigarettes. She has never used smokeless tobacco. She reports that she does not drink alcohol or use drugs.   Family History   Her family history is negative for Breast cancer.   Allergies Allergies  Allergen Reactions  . Aleve [Naproxen] Rash  . Mobic [Meloxicam] Rash  . Tape Rash     Home Medications  Prior to Admission medications   Medication Sig Start Date End Date Taking? Authorizing Provider  acetaminophen (TYLENOL) 500 MG tablet Take 500 mg by mouth every 6 (six) hours as needed.  05/15/11   [provider]  albuterol (PROAIR HFA) 108 (90 Base) MCG/ACT inhaler Inhale 2 puffs into the lungs every 6 (six) hours as needed for wheezing or shortness of breath.  04/12/17   [provider]  aspirin EC 81 MG tablet Take  81 mg by mouth  daily.     [provider]  docusate sodium (COLACE) 100 MG capsule Take 200 mg by mouth daily.     [provider]  DULoxetine (CYMBALTA) 60 MG capsule Take 60 mg by mouth daily.  03/15/17   [provider]  enoxaparin (LOVENOX) 40 MG/0.4ML injection Inject 0.4 mLs (40 mg total) into the skin daily. 03/13/19 04/12/19  Caren Griffins, MD  esomeprazole (NEXIUM) 40 MG capsule Take 40 mg by mouth daily.  01/16/18   [provider]  folic acid (FOLVITE) 1 MG tablet Take 1 mg by mouth 3 (three) times daily.  08/01/17   [provider]  furosemide (LASIX) 40 MG tablet Take 60 mg by mouth daily.  11/28/17   [provider]  loratadine (CLARITIN) 10 MG tablet Take 10 mg by mouth daily.    [provider]  metoprolol tartrate (LOPRESSOR) 25 MG tablet Take 12.5 mg by mouth 2 (two) times daily.  08/01/17   [provider]  midodrine (PROAMATINE) 10 MG tablet Take 1 tablet (10 mg total) by mouth 3 (three) times daily with meals. 03/13/19   Caren Griffins, MD  Multiple Vitamins-Minerals (ICAPS AREDS 2 PO) Take 2 tablets by mouth daily.    [provider]  oxyCODONE (OXY IR/ROXICODONE) 5 MG immediate release tablet Take 1 tablet (5 mg total) by mouth every 6 (six) hours as needed for severe pain. 03/21/19   Medina-Vargas, Monina C, NP  OXYGEN Inhale 2 L into the lungs.    [provider]  pramipexole (MIRAPEX) 1 MG tablet Take 1 mg by mouth at bedtime.  02/05/18   [provider]  predniSONE (DELTASONE) 5 MG tablet Take 5 mg by mouth daily.  12/25/17   [provider]  sulfamethoxazole-trimethoprim (BACTRIM DS,SEPTRA DS) 800-160 MG tablet TAKE ONE TABLET BY MOUTH EVERY MONDAY, [place], AND FRIDAY 12/24/17   [provider]  tiotropium (SPIRIVA HANDIHALER) 18 MCG inhalation capsule Place 18 mcg into inhaler and inhale daily.  04/12/17   [provider]     Tamsen Snider, MD PGY1   See Attending Attestation Note for Final Recommendations.

## 2019-03-27 DIAGNOSIS — J690 Pneumonitis due to inhalation of food and vomit: Secondary | ICD-10-CM

## 2019-03-27 DIAGNOSIS — J9622 Acute and chronic respiratory failure with hypercapnia: Secondary | ICD-10-CM

## 2019-03-27 DIAGNOSIS — J9621 Acute and chronic respiratory failure with hypoxia: Secondary | ICD-10-CM

## 2019-03-27 LAB — BASIC METABOLIC PANEL
Anion gap: 8 (ref 5–15)
BUN: 32 mg/dL — ABNORMAL HIGH (ref 8–23)
CO2: 36 mmol/L — ABNORMAL HIGH (ref 22–32)
Calcium: 8.1 mg/dL — ABNORMAL LOW (ref 8.9–10.3)
Chloride: 94 mmol/L — ABNORMAL LOW (ref 98–111)
Creatinine, Ser: 0.75 mg/dL (ref 0.44–1.00)
GFR calc Af Amer: >60 mL/min (ref >60)
GFR calc non Af Amer: >60 mL/min (ref >60)
Glucose, Bld: 109 mg/dL — ABNORMAL HIGH (ref 70–99)
Potassium: 3.7 mmol/L (ref 3.5–5.1)
Sodium: 138 mmol/L (ref 135–145)

## 2019-03-27 LAB — GLUCOSE, CAPILLARY
Glucose-Capillary: 107 mg/dL — ABNORMAL HIGH (ref 70–99)
Glucose-Capillary: 88 mg/dL (ref 70–99)
Glucose-Capillary: 105 mg/dL — ABNORMAL HIGH (ref 70–99)
Glucose-Capillary: 106 mg/dL — ABNORMAL HIGH (ref 70–99)

## 2019-03-27 LAB — CBC
HCT: 28.8 % — ABNORMAL LOW (ref 36.0–46.0)
Hemoglobin: 8.6 g/dL — ABNORMAL LOW (ref 12.0–15.0)
MCH: 31 pg (ref 26.0–34.0)
MCHC: 29.9 g/dL — ABNORMAL LOW (ref 30.0–36.0)
MCV: 104 fL — ABNORMAL HIGH (ref 80.0–100.0)
Platelets: 299 10*3/uL (ref 150–400)
RBC: 2.77 MIL/uL — ABNORMAL LOW (ref 3.87–5.11)
RDW: 15.6 % — ABNORMAL HIGH (ref 11.5–15.5)
WBC: 10.4 10*3/uL (ref 4.0–10.5)
nRBC: 0.8 % — ABNORMAL HIGH (ref 0.0–0.2)

## 2019-03-27 NOTE — Progress Notes (Signed)
Report given to [place], RN 3E.  Pt transferred by bed.

## 2019-03-27 NOTE — Progress Notes (Signed)
Inpatient Rehab Admissions:  Inpatient Rehab Consult received.  I met with patient at the bedside for rehabilitation assessment and to discuss goals and expectations of an inpatient rehab admission.  I also spoke to her daughter over the phone.  Both confirm they are hopeful for CIR admission, and I feel pt would be an excellent candidate.  Will open insurance for prior authorization for possible admission pending approval and bed availability.   Signed: Shann Medal, PT, DPT Admissions Coordinator [phone] 03/27/19  11:18 AM

## 2019-03-27 NOTE — Progress Notes (Signed)
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  Per EMS report, CPAP machine was not working and oxygen saturations were 69%; patient arrived on 15 L nonrebreather.  Patient was initially febrile, hypotensive and mildly tachycardic and sepsis protocol initiated and patient was given approximately 750 cc IV fluid. She was given cefepime and flagyl.  Work-up revealed pulmonary edema versus atypical infiltrates on chest x-ray, negative Covid-19 and influenza, bland UA. ABG showed respiratory acidosis with pH of 7.2 and PCO2 of 84, BiPAP initiated for approximately 45 minutes however patient's mental status was worsening and she was intubated in the ED. Patient was admitted to critical care service, extubated on 1/26 and transferred to telemetry unit on 1/28.  New events last 24 hours / Subjective: Patient states that she is feeling much better today.  Currently on 3 L nasal cannula O2 from baseline of 2 L.  Denies any chest pain, worsening shortness of breath, has some nausea without vomiting.  Did not feel like eating breakfast today.  Agreeable with going to rehab.  Assessment & Plan:   Principal Problem:   Acute on chronic respiratory failure with hypoxia and hypercapnia (HCC) Active Problems:   Acute on chronic diastolic CHF (congestive heart failure) (HCC)   Autoimmune hemolytic anemia   Obstructive sleep apnea on CPAP   Hypotension   Aspiration pneumonia (HCC)   Acute on chronic hypoxemic and hypercapnic respiratory failure -Thought to be secondary to opiate  medication use after hip surgery in setting of OSA, COPD, CHF exacerbation, aspiration pneumonia -Cefepime for 7 days -Patient has been extubated 1/26 and remains on 3 L nasal cannula O2 currently.  On 2 L nasal cannula O2 at baseline, CPAP nightly  Acute on chronic diastolic heart failure -Echocardiogram 03/24/2019 revealed EF 60 to 45%, grade 1 diastolic dysfunction -Received IV Lasix while in ICU.  Resume home Lasix  Chronic hypotension -Continue midodrine  History of autoimmune hemolytic anemia -Continue prednisone  Recent nontraumatic left hip fracture status post IM nailing -Judicious use of narcotics.  Resume home buprenorphine patch  Gram-positive cocci 1 out of 4 blood culture -Coag negative staph.  This is thought to be a contaminant  Incidental finding of left apical lung nodule -CTA chest 03/24/2019 revealed 4 mm noncalcified left apical lung nodule. This represents a new finding when compared to the prior study dated August 01, 2006. Correlation with 12 month follow-up chest CT is recommended to determine stability.  Depression -Continue Cymbalta   DVT prophylaxis: Subcutaneous heparin Code Status: Full code Family Communication: None at bedside Disposition Plan: Patient is from SNF prior to admission. Currently in-hospital treatment needed due to respiratory failure, although has now been stabilized. Barrier(s) to discharge include CIR evaluation and bed availability and suspect patient will discharge to CIR versus SNF.    Consultants:   PCCM admission   Antimicrobials:  Anti-infectives (From admission, onward)   Start     Dose/Rate Route Frequency Ordered Stop   03/24/19 1315  ceFEPIme (MAXIPIME) 2 g in sodium chloride 0.9 % 100 mL IVPB     2 g 200  mL/hr over 30 Minutes Intravenous Every 12 hours 03/24/19 1302     03/24/19 0315  vancomycin (VANCOREADY) IVPB 2000 mg/400 mL  Status:  Discontinued     2,000 mg 200 mL/hr over 120 Minutes Intravenous  Once 03/24/19  0300 03/24/19 1321   03/24/19 0300  ceFEPIme (MAXIPIME) 2 g in sodium chloride 0.9 % 100 mL IVPB     2 g 200 mL/hr over 30 Minutes Intravenous  Once 03/24/19 0255 03/24/19 0410   03/24/19 0300  metroNIDAZOLE (FLAGYL) IVPB 500 mg     500 mg 100 mL/hr over 60 Minutes Intravenous  Once 03/24/19 0255 03/24/19 0434   03/24/19 0300  vancomycin (VANCOCIN) IVPB 1000 mg/200 mL premix  Status:  Discontinued     1,000 mg 200 mL/hr over 60 Minutes Intravenous  Once 03/24/19 0255 03/24/19 0300        Objective: Vitals:   03/27/19 0400 03/27/19 0500 03/27/19 0721 03/27/19 0747  BP: (!) 114/53  104/68   Pulse: 77 71 73   Resp: (!) 22 19 19    Temp:    97.9 F (36.6 C)  TempSrc:    Axillary  SpO2: 91% 98% 99%   Weight:  96.3 kg    Height:        Intake/Output Summary (Last 24 hours) at 03/27/2019 0900 Last data filed at 03/27/2019 0500 Gross per 24 hour  Intake 210 ml  Output 900 ml  Net -690 ml   Filed Weights   03/25/19 0250 03/26/19 0425 03/27/19 0500  Weight: 93.9 kg 94.3 kg 96.3 kg    Examination:  General exam: Appears calm and comfortable  Respiratory system: Clear to auscultation. Respiratory effort normal. No respiratory distress. No conversational dyspnea.  On 3 L nasal cannula O2 Cardiovascular system: S1 & S2 heard, RRR. No murmurs. No pedal edema. Gastrointestinal system: Abdomen is nondistended, soft and nontender. Normal bowel sounds heard. Central nervous system: Alert and oriented. No focal neurological deficits. Speech clear.  Extremities: Symmetric in appearance  Skin: No rashes, lesions or ulcers on exposed skin  Psychiatry: Judgement and insight appear normal. Mood & affect appropriate.   Data Reviewed: I have personally reviewed following labs and imaging studies  CBC: Recent Labs  Lab 03/24/19 0255 03/24/19 0344 03/24/19 0541 03/24/19 0730 03/25/19 0349 03/26/19 0416 03/27/19 0352  WBC 13.0*  --   --  12.7* 10.0 11.3* 10.4  NEUTROABS 9.9*  --   --    --   --   --   --   HGB 9.7*   < > 12.9 9.1* 8.7* 8.2* 8.6*  HCT 33.1*   < > 38.0 31.6* 28.6* 27.3* 28.8*  MCV 106.4*  --   --  106.0* 100.4* 102.2* 104.0*  PLT 450*  --   --  420* 377 324 299   < > = values in this interval not displayed.   Basic Metabolic Panel: Recent Labs  Lab 03/24/19 0255 03/24/19 0255 03/24/19 0344 03/24/19 0541 03/24/19 0730 03/25/19 0349 03/26/19 0416 03/27/19 0352  NA 139   < > 136 134*  --  141 137 138  K 4.0   < > 3.8 3.9  --  3.4* 3.5 3.7  CL 91*  --   --   --   --  94* 92* 94*  CO2 36*  --   --   --   --  36* 36* 36*  GLUCOSE 119*  --   --   --   --  133*  157* 109*  BUN 18  --   --   --   --  20 24* 32*  CREATININE 0.95  --   --   --  0.93 0.76 0.70 0.75  CALCIUM 8.3*  --   --   --   --  8.1* 7.7* 8.1*  MG  --   --   --   --   --  2.1 2.3  --   PHOS  --   --   --   --   --  2.2* 3.1  --    < > = values in this interval not displayed.   GFR: Estimated Creatinine Clearance: 54.7 mL/min (by C-G formula based on SCr of 0.75 mg/dL). Liver Function Tests: Recent Labs  Lab 03/24/19 0255  AST 57*  ALT 50*  ALKPHOS 101  BILITOT 1.1  PROT 6.0*  ALBUMIN 3.0*   No results for input(s): LIPASE, AMYLASE in the last 168 hours. No results for input(s): AMMONIA in the last 168 hours. Coagulation Profile: Recent Labs  Lab 03/24/19 0255  INR 1.3*   Cardiac Enzymes: No results for input(s): CKTOTAL, CKMB, CKMBINDEX, TROPONINI in the last 168 hours. BNP (last 3 results) No results for input(s): PROBNP in the last 8760 hours. HbA1C: No results for input(s): HGBA1C in the last 72 hours. CBG: Recent Labs  Lab 03/26/19 0347 03/26/19 0701 03/26/19 1908 03/26/19 2310 03/27/19 0304  GLUCAP 148* 136* 136* 125* 107*   Lipid Profile: No results for input(s): CHOL, HDL, LDLCALC, TRIG, CHOLHDL, LDLDIRECT in the last 72 hours. Thyroid Function Tests: No results for input(s): TSH, T4TOTAL, FREET4, T3FREE, THYROIDAB in the last 72 hours. Anemia  Panel: No results for input(s): VITAMINB12, FOLATE, FERRITIN, TIBC, IRON, RETICCTPCT in the last 72 hours. Sepsis Labs: Recent Labs  Lab 03/24/19 0255 03/25/19 0349 03/26/19 0416  PROCALCITON <0.10 0.14 <0.10  LATICACIDVEN 1.5  --   --     Recent Results (from the past 240 hour(s))  Respiratory Panel by RT PCR (Flu A&B, Covid) - Nasopharyngeal Swab     Status: None   Collection Time: 03/24/19  2:58 AM   Specimen: Nasopharyngeal Swab  Result Value Ref Range Status   SARS Coronavirus 2 by RT PCR NEGATIVE NEGATIVE Final    Comment: (NOTE) SARS-CoV-2 target nucleic acids are NOT DETECTED. The SARS-CoV-2 RNA is generally detectable in upper respiratoy specimens during the acute phase of infection. The lowest concentration of SARS-CoV-2 viral copies this assay can detect is 131 copies/mL. A negative result does not preclude SARS-Cov-2 infection and should not be used as the sole basis for treatment or other patient management decisions. A negative result may occur with  improper specimen collection/handling, submission of specimen other than nasopharyngeal swab, presence of viral mutation(s) within the areas targeted by this assay, and inadequate number of viral copies (<131 copies/mL). A negative result must be combined with clinical observations, patient history, and epidemiological information. The expected result is Negative. Fact Sheet for Patients:  PinkCheek.be Fact Sheet for Healthcare Providers:  GravelBags.it This test is not yet ap proved or cleared by the [place] FDA and  has been authorized for detection and/or diagnosis of SARS-CoV-2 by FDA under an Emergency Use Authorization (EUA). This EUA will remain  in effect (meaning this test can be used) for the duration of the COVID-19 declaration under Section 564(b)(1) of the Act, 21 U.S.C. section 360bbb-3(b)(1), unless the authorization is terminated  or revoked sooner.    Influenza A  by PCR NEGATIVE NEGATIVE Final   Influenza B by PCR NEGATIVE NEGATIVE Final    Comment: (NOTE) The Xpert Xpress SARS-CoV-2/FLU/RSV assay is intended as an aid in  the diagnosis of influenza from Nasopharyngeal swab specimens and  should not be used as a sole basis for treatment. Nasal washings and  aspirates are unacceptable for Xpert Xpress SARS-CoV-2/FLU/RSV  testing. Fact Sheet for Patients: PinkCheek.be Fact Sheet for Healthcare Providers: GravelBags.it This test is not yet approved or cleared by the [place] FDA and  has been authorized for detection and/or diagnosis of SARS-CoV-2 by  FDA under an Emergency Use Authorization (EUA). This EUA will remain  in effect (meaning this test can be used) for the duration of the  Covid-19 declaration under Section 564(b)(1) of the Act, 21  U.S.C. section 360bbb-3(b)(1), unless the authorization is  terminated or revoked. Performed at [facility], [place] [address], [place], [place] [address]   Blood Culture (routine x 2)     Status: None (Preliminary result)   Collection Time: 03/24/19  3:00 AM   Specimen: BLOOD LEFT HAND  Result Value Ref Range Status   Specimen Description BLOOD LEFT HAND  Final   Special Requests   Final    BOTTLES DRAWN AEROBIC AND ANAEROBIC Blood Culture results may not be optimal due to an excessive volume of blood received in culture bottles   Culture   Final    NO GROWTH 2 DAYS Performed at [facility], [place] [address] [place], [place] [address]    Report Status PENDING  Incomplete  Blood Culture (routine x 2)     Status: Abnormal   Collection Time: 03/24/19  3:05 AM   Specimen: BLOOD  Result Value Ref Range Status   Specimen Description BLOOD LEFT ANTECUBITAL  Final   Special Requests   Final    BOTTLES DRAWN AEROBIC AND ANAEROBIC Blood Culture adequate volume   Culture  Setup Time   Final     AEROBIC BOTTLE ONLY GRAM POSITIVE COCCI CRITICAL RESULT CALLED TO, READ BACK BY AND VERIFIED WITH: KAREN AMEND  PHARM @ 1448 ON 03/25/19 BY ROBINSON Z.     Culture (A)  Final    STAPHYLOCOCCUS SPECIES (COAGULASE NEGATIVE) THE SIGNIFICANCE OF ISOLATING THIS ORGANISM FROM A SINGLE SET OF BLOOD CULTURES WHEN MULTIPLE SETS ARE DRAWN IS UNCERTAIN. PLEASE NOTIFY THE MICROBIOLOGY DEPARTMENT WITHIN ONE WEEK IF SPECIATION AND SENSITIVITIES ARE REQUIRED. Performed at [facility], [place] [address], [place], [place] [address]    Report Status 03/26/2019 FINAL  Final  Urine culture     Status: None   Collection Time: 03/24/19  3:20 AM   Specimen: In/Out Cath Urine  Result Value Ref Range Status   Specimen Description IN/OUT CATH URINE  Final   Special Requests NONE  Final   Culture   Final    NO GROWTH Performed at [facility], [place] [address], [place], [place] [address]    Report Status 03/24/2019 FINAL  Final  MRSA PCR Screening     Status: None   Collection Time: 03/24/19  7:42 AM   Specimen: Nasal Mucosa; Nasopharyngeal  Result Value Ref Range Status   MRSA by PCR NEGATIVE NEGATIVE Final    Comment:        The GeneXpert MRSA Assay (FDA approved for NASAL specimens only), is one component of a comprehensive MRSA colonization surveillance program. It is not intended to diagnose MRSA infection nor to guide or  monitor treatment for MRSA infections. Performed at [facility], [place] [address], [place], [place] [address]       Radiology Studies: No results found.    Scheduled Meds: . aspirin  81 mg Oral Daily  . [START ON 04/01/2019] buprenorphine  1 patch Transdermal Q Tue  . chlorhexidine gluconate (MEDLINE KIT)  15 mL Mouth Rinse BID  . Chlorhexidine Gluconate Cloth  6 each Topical Q0600  . DULoxetine  60 mg Oral Daily  . furosemide  60 mg Oral Daily  . heparin  5,000 Units Subcutaneous Q8H  . mouth rinse  15 mL Mouth Rinse BID  . midodrine  10 mg Oral TID WC   . pantoprazole  40 mg Oral Daily  . pramipexole  1 mg Oral QHS  . predniSONE  5 mg Oral Q breakfast  . senna-docusate  2 tablet Oral BID  . sodium chloride flush  10-40 mL Intracatheter Q12H  . umeclidinium bromide  1 puff Inhalation Daily   Continuous Infusions: . ceFEPime (MAXIPIME) IV Stopped (03/26/19 2155)     LOS: 3 days      Time spent: 35 minutes   Dessa Phi, DO [facility] 03/27/2019, 9:00 AM   Available via Epic secure chat 7am-7pm After these hours, please refer to coverage provider listed on [email or web address]

## 2019-03-27 NOTE — Progress Notes (Signed)
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 ETT 1/25-1/26. CTA reveals No PE, Bl upper lobe atelectasis /or infiltrate, bl lobe consolidation, and moderate bilateral pleural effusions.    PT Comments    Patient progressing with mobility, able to come up to sit with 1 assist.  Still limited by pain, but did not pre-medicate.  Patient fearful of too much medication as she ended up with respiratory failure.  Feel she remains appropriate for CIR level rehab at d/c.  PT to follow.    Follow Up Recommendations  CIR     Equipment Recommendations  None recommended by PT    Recommendations for Other Services       Precautions / Restrictions Precautions Precautions: Fall Restrictions LLE Weight Bearing: Weight bearing as tolerated    Mobility  Bed Mobility Overal bed mobility: Needs Assistance Bed Mobility: Supine to Sit     Supine to sit: Mod assist;HOB elevated     General bed mobility comments: cues for technique assisted with R leg to scoot to EOB, assist to lift trunk and for L LE  Transfers Overall transfer level: Needs assistance Equipment used: Rolling walker (2 wheeled)(youth RW) Transfers: Sit to/from Stand Sit to Stand: Mod assist;+2 physical assistance;+2 safety/equipment            Ambulation/Gait Ambulation/Gait assistance: Mod assist;+2 physical assistance Gait Distance (Feet): 3 Feet Assistive device: Rolling walker (2 wheeled) Gait Pattern/deviations: Step-to pattern;Decreased stride length;Shuffle;Trunk flexed     General Gait Details: cues for technique to use walker to unweight R LE and step with L   Stairs              Wheelchair Mobility    Modified Rankin (Stroke Patients Only)       Balance Overall balance assessment: Needs assistance Sitting-balance support: Bilateral upper extremity supported;Feet supported Sitting balance-Leahy Scale: Fair     Standing balance support: Bilateral upper extremity supported;During functional activity Standing balance-Leahy Scale: Poor                              Cognition Arousal/Alertness: Awake/alert Behavior During Therapy: WFL for tasks assessed/performed Overall Cognitive Status: Within Functional Limits for tasks assessed                                        Exercises Total Joint Exercises Ankle Circles/Pumps: AROM;5 reps;Supine;Both Quad Sets: AROM;5 reps;Left;Supine Short Arc Quad: AROM;5 reps;Supine;Left Heel Slides: AROM;AAROM;5 reps;Supine;Both    General Comments General comments (skin integrity, edema, etc.): maintained on 2LO2; Stood with RW and min A for peri care due to soiled with BM in bed      Pertinent Vitals/Pain Pain Assessment: Faces Faces Pain Scale: Hurts whole lot Pain Location: L hip Pain Descriptors / Indicators: Discomfort;Grimacing;Guarding Pain Intervention(s): Monitored during session;Repositioned;Patient requesting pain meds-RN notified;Ice applied    Home Living                      Prior Function  PT Goals (current goals can now be found in the care plan section) Progress towards PT goals: Progressing toward goals    Frequency    Min 3X/week      PT Plan Current plan remains appropriate    Co-evaluation              AM-PAC PT "6 Clicks" Mobility   Outcome Measure  Help needed turning from your back to your side while in a flat bed without using bedrails?: A Lot Help needed moving from lying on your back to sitting on the side of a flat bed without using bedrails?: A Lot Help needed moving to and from a bed to a chair (including a  wheelchair)?: A Lot Help needed standing up from a chair using your arms (e.g., wheelchair or bedside chair)?: A Lot Help needed to walk in hospital room?: A Lot Help needed climbing 3-5 steps with a railing? : Total 6 Click Score: 11    End of Session Equipment Utilized During Treatment: Gait belt;Oxygen Activity Tolerance: Patient tolerated treatment well Patient left: in chair;with call bell/phone within reach;with chair alarm set   PT Visit Diagnosis: Other abnormalities of gait and mobility (R26.89);Difficulty in walking, not elsewhere classified (R26.2);Pain Pain - Right/Left: Left Pain - part of body: Leg     Time: [id number] PT Time Calculation (min) (ACUTE ONLY): 26 min  Charges:  $Gait Training: 8-22 mins $Therapeutic Exercise: 8-22 mins                     [place] Thompson, [facility] [phone] 03/27/2019    Virginia Thompson 03/27/2019, 4:35 PM

## 2019-03-28 ENCOUNTER — Inpatient Hospital Stay (HOSPITAL_COMMUNITY): Payer: Medicare Other

## 2019-03-28 LAB — CBC
HCT: 31.7 % — ABNORMAL LOW (ref 36.0–46.0)
Hemoglobin: 9.2 g/dL — ABNORMAL LOW (ref 12.0–15.0)
MCH: 30.2 pg (ref 26.0–34.0)
MCHC: 29 g/dL — ABNORMAL LOW (ref 30.0–36.0)
MCV: 103.9 fL — ABNORMAL HIGH (ref 80.0–100.0)
Platelets: 299 10*3/uL (ref 150–400)
RBC: 3.05 MIL/uL — ABNORMAL LOW (ref 3.87–5.11)
RDW: 15.4 % (ref 11.5–15.5)
WBC: 6.8 10*3/uL (ref 4.0–10.5)
nRBC: 1.6 % — ABNORMAL HIGH (ref 0.0–0.2)

## 2019-03-28 LAB — TYPE AND SCREEN
ABO/RH(D): A POS
Antibody Screen: POSITIVE
Donor AG Type: NEGATIVE
Unit division: 0

## 2019-03-28 LAB — BPAM RBC
Blood Product Expiration Date: 202102062359
Unit Type and Rh: 6200

## 2019-03-28 LAB — GLUCOSE, CAPILLARY: Glucose-Capillary: 97 mg/dL (ref 70–99)

## 2019-03-28 LAB — D-DIMER, QUANTITATIVE: D-Dimer, Quant: 3.62 ug/mL-FEU — ABNORMAL HIGH (ref 0.00–0.50)

## 2019-03-28 LAB — BRAIN NATRIURETIC PEPTIDE: B Natriuretic Peptide: 1416.5 pg/mL — ABNORMAL HIGH (ref 0.0–100.0)

## 2019-03-28 MED ORDER — DIGOXIN 0.25 MG/ML IJ SOLN
0.2500 mg | Freq: Once | INTRAMUSCULAR | Status: AC
  Administered 2019-03-28: 0.25 mg via INTRAVENOUS
  Filled 2019-03-28: qty 2

## 2019-03-28 MED ORDER — DILTIAZEM LOAD VIA INFUSION
10.0000 mg | Freq: Once | INTRAVENOUS | Status: DC
  Filled 2019-03-28: qty 10

## 2019-03-28 MED ORDER — SODIUM CHLORIDE 0.9 % IV BOLUS: 500.0000 mL | Freq: Once | INTRAVENOUS | Status: DC

## 2019-03-28 MED ORDER — DIGOXIN 0.25 MG/ML IJ SOLN
0.1250 mg | Freq: Once | INTRAMUSCULAR | Status: AC
  Administered 2019-03-28: 02:00:00 0.125 mg via INTRAVENOUS
  Filled 2019-03-28: qty 2

## 2019-03-28 MED ORDER — METOPROLOL TARTRATE 12.5 MG HALF TABLET
12.5000 mg | ORAL_TABLET | Freq: Two times a day (BID) | ORAL | Status: DC
  Administered 2019-03-28 – 2019-03-31 (×7): 12.5 mg via ORAL
  Filled 2019-03-28 (×7): qty 1

## 2019-03-28 MED ORDER — DILTIAZEM HCL-DEXTROSE 125-5 MG/125ML-% IV SOLN (PREMIX)
5.0000 mg/h | INTRAVENOUS | Status: DC
  Filled 2019-03-28: qty 125

## 2019-03-28 MED ORDER — GUAIFENESIN 100 MG/5ML PO SOLN: 5.0000 mL | ORAL | Status: DC | PRN

## 2019-03-28 NOTE — Progress Notes (Signed)
  Speech Language Pathology Treatment: Dysphagia  Patient Details Name: Virginia Thompson MRN: [id number] DOB: 24-Aug-1928 Today's Date: 03/28/2019 Time: [id number] SLP Time Calculation (min) (ACUTE ONLY): 32 min  Assessment / Plan / Recommendation Clinical Impression  Pt seen at bedside for skilled ST targeting dysphagia. Pt with HOB raised, both upper and lower dentures present in oral cavity today. Pt reporting fatigue and reduced appetite, but agreeable to PO trials.  Pt seen with cup and straw sips of thin liquid (water): no overt s/sx aspiration despite thorough challenging.  Pt seen with chopped peaches: prolonged mastication and mild difficulty with bolus formation, 1 throat clear following the swallow after large bite. No other s/sx aspiration, pt responsive to verbal cues for compensatory strategy of taking smaller bites.  Pt seen with mixed consistency regular solids (frosted flakes cereal in milk). Pt with moderately prolonged mastication, prolonged AP transfer and residue after the swallow. Patient able to clear oral cavity with 2 additional swallows. No overt s/sx aspiration.  Recommend thin liquids and dysphagia 3 solids due to patient's difficulty with mastication and for increased energy conservation.  Patient provided education re: compensatory strategies and aspiration precautions, she verbalized understanding.  Of note, at end of session, patient stating (while eating peaches) "none of this really has a taste" and "it all just tastes like nothing." Upon further questioning, patient reported this was new. ST provided this information to RN and Attending.  RN edu re: diet recommendations.  ST to follow for brief follow up.   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  Patient was febrile and had  worsening respiratory failure so intubated in the ED. Covid-19 negative      SLP Plan          Recommendations  Diet recommendations: Dysphagia 3 (mechanical soft);Thin liquid Liquids provided via: Cup;Straw Medication Administration: Crushed with puree Supervision: Patient able to self feed Compensations: Slow rate;Small sips/bites;Lingual sweep for clearance of pocketing Postural Changes and/or Swallow Maneuvers: Seated upright 90 degrees                Oral Care Recommendations: Oral care BID Follow up Recommendations: None SLP Visit Diagnosis: Dysphagia, unspecified (R13.10)       [place] Thompson, M.Ed., [facility] [phone]: Acute Rehab office [phone] - pager    Virginia Thompson 03/28/2019, 11:09 AM

## 2019-03-28 NOTE — Progress Notes (Signed)
Patient remains in atrial fibrillation rate 110-120's last b/p 90/55. Text paged NP M. Sharlet Salina.

## 2019-03-28 NOTE — Progress Notes (Signed)
Patient remains in atrial fib rate 110-120.Patient denies shortness of breath or palpitations at present time.Will report off to on coming nurse to continue to monitor.

## 2019-03-28 NOTE — Progress Notes (Signed)
MEWS Guidelines - (patients age 84 and over)  Red - At High Risk for Deterioration Yellow - At risk for Deterioration  1. Go to room and assess patient 2. Validate data. Is this patient's baseline? If data confirmed: 3. Is this an acute change? 4. Administer prn meds/treatments as ordered. 5. Note Sepsis score 6. Review goals of care 7. Notify Charge Nurse, RRT nurse and Provider. 8. Ask Provider to come to bedside.  9. Document patient condition/interventions/response. 10. Increase frequency of vital signs and focused assessments to at least q15 minutes x 4, then q30 minutes x2. - If stable, then q1h x3, then q4h x3 and then q8h or dept. routine. - If unstable, contact Provider & RRT nurse. Prepare for possible transfer. 11. Add entry in progress notes using the smart phrase ".MEWS". 1. Go to room and assess patient 2. Validate data. Is this patient's baseline? If data confirmed: 3. Is this an acute change? 4. Administer prn meds/treatments as ordered? 5. Note Sepsis score 6. Review goals of care 7. Notify Charge Nurse and Provider 8. Call RRT nurse as needed. 9. Document patient condition/interventions/response. 10. Increase frequency of vital signs and focused assessments to at least q2h x2. - If stable, then q4h x2 and then q8h or dept. routine. - If unstable, contact Provider & RRT nurse. Prepare for possible transfer. 11. Add entry in progress notes using the smart phrase ".MEWS".  Green - Likely stable Lavender - Comfort Care Only  1. Continue routine/ordered monitoring.  2. Review goals of care. 1. Continue routine/ordered monitoring. 2. Review goals of care.     

## 2019-03-28 NOTE — Progress Notes (Signed)
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  Per EMS report, CPAP machine was not working and oxygen saturations were 69%; patient arrived on 15 L nonrebreather.  Patient was initially febrile, hypotensive and mildly tachycardic and sepsis protocol initiated and patient was given approximately 750 cc IV fluid. She was given cefepime and flagyl.  Work-up revealed pulmonary edema versus atypical infiltrates on chest x-ray, negative Covid-19 and influenza, bland UA. ABG showed respiratory acidosis with pH of 7.2 and PCO2 of 84, BiPAP initiated for approximately 45 minutes however patient's mental status was worsening and she was intubated in the ED. Patient was admitted to critical care service, extubated on 1/26 and transferred to telemetry unit on 1/28.  New events last 24 hours / Subjective: Patient went into A fib RVR overnight. She was given IV digoxin. This morning, she is back in normal sinus rhythm. States she has no appetite but breathing seems to be better. Denies chest pain, or palpitations. States she has palpitations intermittently at home.   Assessment & Plan:   Principal Problem:   Acute on chronic respiratory failure with hypoxia and hypercapnia (HCC) Active Problems:   Acute on chronic diastolic CHF (congestive heart failure) (HCC)   Autoimmune hemolytic anemia   Obstructive sleep apnea on CPAP   Hypotension   Aspiration pneumonia (HCC)   Acute on chronic hypoxemic and hypercapnic respiratory failure -Thought to be secondary to opiate medication  use after hip surgery in setting of OSA, COPD, CHF exacerbation, aspiration pneumonia -Cefepime for 7 days -Patient has been extubated 1/26 and remains on 3 L nasal cannula O2 currently.  On 2 L nasal cannula O2 at baseline, CPAP nightly  A Fib RVR -No previous documentation or diagnosis of A Fib. She was given digoxin with return to NSR this morning. Her home metoprolol has been held during hospitalization, which I will resume. CHADSVASC 5. She is on baby aspirin at baseline. Discussed in detail with daughter regarding benefit of starting anticoagulation vs risk of bleed. Patient has fallen in the past, but not so much recently. She does have a PCP and cardiologist at [facility], although has not seen her cardiologist in a while. Daughter was hesitant on starting anticoagulation today. I've recommended patient to have close follow up with her physicians who are familiar with patient and discuss further with them regarding the benefit and risk of anticoagulation in this 86 yo patient.  -Continue metoprolol, aspirin   Acute on chronic diastolic heart failure -Echocardiogram 03/24/2019 revealed EF 60 to 40%, grade 1 diastolic dysfunction -Received IV Lasix while in ICU.  Resume home Lasix -BNP 2540 --> 1416   Chronic hypotension -Continue midodrine -Baseline BP around 100/50 per daughter   History of autoimmune hemolytic anemia -Continue prednisone  Recent nontraumatic left hip fracture status post IM nailing -Judicious use of narcotics.  Resume home buprenorphine patch when available from pharmacy   Gram-positive cocci 1 out of 4 blood culture -Coag negative staph.  This is thought to be a contaminant  Incidental finding of left apical lung nodule -CTA chest 03/24/2019 revealed 4 mm  noncalcified left apical lung nodule. This represents a new finding when compared to the prior study dated August 01, 2006. Correlation with 12 month follow-up chest CT is recommended to determine  stability.  Depression -Continue Cymbalta  Elevated d-dimer -Patient has had negative CTA chest for PE at time of admission. My suspicion for PE is low. She has multiple other medical issues as above which could cause her respiratory symptoms as well as non-VTE related elevation in [place].    DVT prophylaxis: Subcutaneous heparin Code Status: Full code Family Communication: None at bedside; discussed with daughter over the phone this morning  Disposition Plan: Patient is from SNF prior to admission. Currently in-hospital treatment needed due to respiratory failure, although has now been stabilized. A Fib RVR resolved. Barrier(s) to discharge include CIR insurance auth and bed availability and suspect patient will discharge to CIR versus SNF.    Consultants:   PCCM admission   Antimicrobials:  Anti-infectives (From admission, onward)   Start     Dose/Rate Route Frequency Ordered Stop   03/24/19 1315  ceFEPIme (MAXIPIME) 2 g in sodium chloride 0.9 % 100 mL IVPB     2 g 200 mL/hr over 30 Minutes Intravenous Every 12 hours 03/24/19 1302 03/31/19 0959   03/24/19 0315  vancomycin (VANCOREADY) IVPB 2000 mg/400 mL  Status:  Discontinued     2,000 mg 200 mL/hr over 120 Minutes Intravenous  Once 03/24/19 0300 03/24/19 1321   03/24/19 0300  ceFEPIme (MAXIPIME) 2 g in sodium chloride 0.9 % 100 mL IVPB     2 g 200 mL/hr over 30 Minutes Intravenous  Once 03/24/19 0255 03/24/19 0410   03/24/19 0300  metroNIDAZOLE (FLAGYL) IVPB 500 mg     500 mg 100 mL/hr over 60 Minutes Intravenous  Once 03/24/19 0255 03/24/19 0434   03/24/19 0300  vancomycin (VANCOCIN) IVPB 1000 mg/200 mL premix  Status:  Discontinued     1,000 mg 200 mL/hr over 60 Minutes Intravenous  Once 03/24/19 0255 03/24/19 0300       Objective: Vitals:   03/28/19 0648 03/28/19 0736 03/28/19 0755 03/28/19 0800  BP: 99/68  (!) 107/58 (!) 112/48  Pulse:  83 81 94  Resp: 20 16 (!) 23 20  Temp:   (!) 97.3 F (36.3 C) (!) 97.4 F  (36.3 C)  TempSrc:   Oral Oral  SpO2: 93% 100% 100% 97%  Weight:      Height:        Intake/Output Summary (Last 24 hours) at 03/28/2019 0928 Last data filed at 03/28/2019 4431 Gross per 24 hour  Intake 340 ml  Output 875 ml  Net -535 ml   Filed Weights   03/26/19 0425 03/27/19 0500 03/28/19 0027  Weight: 94.3 kg 96.3 kg 94.9 kg    Examination: General exam: Appears calm and comfortable  Respiratory system: Clear to auscultation. Respiratory effort normal. On [place] O2 without conversational dyspnea.  Cardiovascular system: S1 & S2 heard, RRR. No pedal edema. Gastrointestinal system: Abdomen is nondistended, soft and nontender. Normal bowel sounds heard. Central nervous system: Alert and oriented. Non focal exam. Speech clear  Extremities: Symmetric in appearance bilaterally  Skin: No rashes, lesions or ulcers on exposed skin  Psychiatry: Judgement and insight appear stable. Mood & affect appropriate.     Data Reviewed: I have personally reviewed following labs and imaging studies  CBC: Recent Labs  Lab 03/24/19 0255 03/24/19 0344 03/24/19 0730 03/25/19 0349 03/26/19 0416 03/27/19 0352 03/28/19 0530  WBC 13.0*   < >  12.7* 10.0 11.3* 10.4 6.8  NEUTROABS 9.9*  --   --   --   --   --   --   HGB 9.7*   < > 9.1* 8.7* 8.2* 8.6* 9.2*  HCT 33.1*   < > 31.6* 28.6* 27.3* 28.8* 31.7*  MCV 106.4*   < > 106.0* 100.4* 102.2* 104.0* 103.9*  PLT 450*   < > 420* 377 324 299 299   < > = values in this interval not displayed.   Basic Metabolic Panel: Recent Labs  Lab 03/24/19 0255 03/24/19 0255 03/24/19 0344 03/24/19 0541 03/24/19 0730 03/25/19 0349 03/26/19 0416 03/27/19 0352  NA 139   < > 136 134*  --  141 137 138  K 4.0   < > 3.8 3.9  --  3.4* 3.5 3.7  CL 91*  --   --   --   --  94* 92* 94*  CO2 36*  --   --   --   --  36* 36* 36*  GLUCOSE 119*  --   --   --   --  133* 157* 109*  BUN 18  --   --   --   --  20 24* 32*  CREATININE 0.95  --   --   --  0.93 0.76 0.70 0.75   CALCIUM 8.3*  --   --   --   --  8.1* 7.7* 8.1*  MG  --   --   --   --   --  2.1 2.3  --   PHOS  --   --   --   --   --  2.2* 3.1  --    < > = values in this interval not displayed.   GFR: Estimated Creatinine Clearance: 54.3 mL/min (by C-G formula based on SCr of 0.75 mg/dL). Liver Function Tests: Recent Labs  Lab 03/24/19 0255  AST 57*  ALT 50*  ALKPHOS 101  BILITOT 1.1  PROT 6.0*  ALBUMIN 3.0*   No results for input(s): LIPASE, AMYLASE in the last 168 hours. No results for input(s): AMMONIA in the last 168 hours. Coagulation Profile: Recent Labs  Lab 03/24/19 0255  INR 1.3*   Cardiac Enzymes: No results for input(s): CKTOTAL, CKMB, CKMBINDEX, TROPONINI in the last 168 hours. BNP (last 3 results) No results for input(s): PROBNP in the last 8760 hours. HbA1C: No results for input(s): HGBA1C in the last 72 hours. CBG: Recent Labs  Lab 03/27/19 0304 03/27/19 0744 03/27/19 1058 03/27/19 2036 03/28/19 0049  GLUCAP 107* 88 105* 106* 97   Lipid Profile: No results for input(s): CHOL, HDL, LDLCALC, TRIG, CHOLHDL, LDLDIRECT in the last 72 hours. Thyroid Function Tests: No results for input(s): TSH, T4TOTAL, FREET4, T3FREE, THYROIDAB in the last 72 hours. Anemia Panel: No results for input(s): VITAMINB12, FOLATE, FERRITIN, TIBC, IRON, RETICCTPCT in the last 72 hours. Sepsis Labs: Recent Labs  Lab 03/24/19 0255 03/25/19 0349 03/26/19 0416  PROCALCITON <0.10 0.14 <0.10  LATICACIDVEN 1.5  --   --     Recent Results (from the past 240 hour(s))  Respiratory Panel by RT PCR (Flu A&B, Covid) - Nasopharyngeal Swab     Status: None   Collection Time: 03/24/19  2:58 AM   Specimen: Nasopharyngeal Swab  Result Value Ref Range Status   SARS Coronavirus 2 by RT PCR NEGATIVE NEGATIVE Final    Comment: (NOTE) SARS-CoV-2 target nucleic acids are NOT DETECTED. The SARS-CoV-2 RNA is generally detectable in  upper respiratoy specimens during the acute phase of infection. The  lowest concentration of SARS-CoV-2 viral copies this assay can detect is 131 copies/mL. A negative result does not preclude SARS-Cov-2 infection and should not be used as the sole basis for treatment or other patient management decisions. A negative result may occur with  improper specimen collection/handling, submission of specimen other than nasopharyngeal swab, presence of viral mutation(s) within the areas targeted by this assay, and inadequate number of viral copies (<131 copies/mL). A negative result must be combined with clinical observations, patient history, and epidemiological information. The expected result is Negative. Fact Sheet for Patients:  PinkCheek.be Fact Sheet for Healthcare Providers:  GravelBags.it This test is not yet ap proved or cleared by the [place] FDA and  has been authorized for detection and/or diagnosis of SARS-CoV-2 by FDA under an Emergency Use Authorization (EUA). This EUA will remain  in effect (meaning this test can be used) for the duration of the COVID-19 declaration under Section 564(b)(1) of the Act, 21 U.S.C. section 360bbb-3(b)(1), unless the authorization is terminated or revoked sooner.    Influenza A by PCR NEGATIVE NEGATIVE Final   Influenza B by PCR NEGATIVE NEGATIVE Final    Comment: (NOTE) The Xpert Xpress SARS-CoV-2/FLU/RSV assay is intended as an aid in  the diagnosis of influenza from Nasopharyngeal swab specimens and  should not be used as a sole basis for treatment. Nasal washings and  aspirates are unacceptable for Xpert Xpress SARS-CoV-2/FLU/RSV  testing. Fact Sheet for Patients: PinkCheek.be Fact Sheet for Healthcare Providers: GravelBags.it This test is not yet approved or cleared by the [place] FDA and  has been authorized for detection and/or diagnosis of SARS-CoV-2 by  FDA under an Emergency  Use Authorization (EUA). This EUA will remain  in effect (meaning this test can be used) for the duration of the  Covid-19 declaration under Section 564(b)(1) of the Act, 21  U.S.C. section 360bbb-3(b)(1), unless the authorization is  terminated or revoked. Performed at [facility], [place] [address] [place], [place] [address]   Blood Culture (routine x 2)     Status: None (Preliminary result)   Collection Time: 03/24/19  3:00 AM   Specimen: BLOOD LEFT HAND  Result Value Ref Range Status   Specimen Description BLOOD LEFT HAND  Final   Special Requests   Final    BOTTLES DRAWN AEROBIC AND ANAEROBIC Blood Culture results may not be optimal due to an excessive volume of blood received in culture bottles   Culture   Final    NO GROWTH 4 DAYS Performed at [facility], [place] [address] [place], [place] [address]    Report Status PENDING  Incomplete  Blood Culture (routine x 2)     Status: Abnormal   Collection Time: 03/24/19  3:05 AM   Specimen: BLOOD  Result Value Ref Range Status   Specimen Description BLOOD LEFT ANTECUBITAL  Final   Special Requests   Final    BOTTLES DRAWN AEROBIC AND ANAEROBIC Blood Culture adequate volume   Culture  Setup Time   Final    AEROBIC BOTTLE ONLY GRAM POSITIVE COCCI CRITICAL RESULT CALLED TO, READ BACK BY AND VERIFIED WITH: KAREN AMEND  PHARM @ 1517 ON 03/25/19 BY ROBINSON Z.     Culture (A)  Final    STAPHYLOCOCCUS SPECIES (COAGULASE NEGATIVE) THE SIGNIFICANCE OF ISOLATING THIS ORGANISM FROM A SINGLE SET OF BLOOD CULTURES WHEN MULTIPLE SETS ARE DRAWN IS UNCERTAIN. PLEASE NOTIFY THE  MICROBIOLOGY DEPARTMENT WITHIN ONE WEEK IF SPECIATION AND SENSITIVITIES ARE REQUIRED. Performed at [facility], [place] [address], [place], [place] [address]    Report Status 03/26/2019 FINAL  Final  Urine culture     Status: None   Collection Time: 03/24/19  3:20 AM   Specimen: In/Out Cath Urine  Result Value Ref Range Status   Specimen Description  IN/OUT CATH URINE  Final   Special Requests NONE  Final   Culture   Final    NO GROWTH Performed at [facility], [place] [address], [place], [place] [address]    Report Status 03/24/2019 FINAL  Final  MRSA PCR Screening     Status: None   Collection Time: 03/24/19  7:42 AM   Specimen: Nasal Mucosa; Nasopharyngeal  Result Value Ref Range Status   MRSA by PCR NEGATIVE NEGATIVE Final    Comment:        The GeneXpert MRSA Assay (FDA approved for NASAL specimens only), is one component of a comprehensive MRSA colonization surveillance program. It is not intended to diagnose MRSA infection nor to guide or monitor treatment for MRSA infections. Performed at [facility], [place] [address] [place], [place] [address]       Radiology Studies: DG CHEST PORT 1 VIEW  Result Date: 03/28/2019 CLINICAL DATA:  Increased shortness of breath EXAM: PORTABLE CHEST 1 VIEW COMPARISON:  Chest CT from 4 days ago FINDINGS: Tracheal and esophageal extubation. Right PICC with tip at the lower SVC. There is interstitial coarsening and layering left pleural effusion. Cardiomegaly. Aeration has mildly improved from before. IMPRESSION: Interstitial opacities have mildly improved from comparison. There is a layering left pleural effusion which may have increased from study 4 days ago. Electronically Signed   By: Monte Fantasia M.D.   On: 03/28/2019 05:32      Scheduled Meds: . aspirin  81 mg Oral Daily  . [START ON 04/01/2019] buprenorphine  1 patch Transdermal Q Tue  . chlorhexidine gluconate (MEDLINE KIT)  15 mL Mouth Rinse BID  . Chlorhexidine Gluconate Cloth  6 each Topical Q0600  . DULoxetine  60 mg Oral Daily  . furosemide  60 mg Oral Daily  . heparin  5,000 Units Subcutaneous Q8H  . mouth rinse  15 mL Mouth Rinse BID  . midodrine  10 mg Oral TID WC  . pantoprazole  40 mg Oral Daily  . pramipexole  1 mg Oral QHS  . predniSONE  5 mg Oral Q breakfast  . senna-docusate  2 tablet Oral BID  .  sodium chloride flush  10-40 mL Intracatheter Q12H  . umeclidinium bromide  1 puff Inhalation Daily   Continuous Infusions: . ceFEPime (MAXIPIME) IV 2 g (03/28/19 0832)     LOS: 4 days      Time spent: 35 minutes   Dessa Phi, DO [facility] 03/28/2019, 9:28 AM   Available via Epic secure chat 7am-7pm After these hours, please refer to coverage provider listed on [email or web address]

## 2019-03-28 NOTE — Progress Notes (Signed)
0145 Received call form central telemetry that heart rate elevated 130's. Patient lying in bed asleep. Awaken patient blood pressure 96/59 with elevated heart rate 130-140"s.Patient denies shortness of breath but complains of palpitations. 0149 12 lead EKG obtained .0200 Text paged NP M. Sharlet Salina.

## 2019-03-28 NOTE — Progress Notes (Signed)
New order for digoxin 0.25mg  IV ordered and given.

## 2019-03-28 NOTE — Progress Notes (Signed)
Digoxin 0.125 mg IV given as ordered.

## 2019-03-28 NOTE — Progress Notes (Signed)
Pharmacy Antibiotic Note  STEVE SCHEIBLE is a 86 y.o. female admitted on 03/24/2019 with hypoxia and started on cefepime for possible pneumonia and undergoing PE rule. MRSA PCR this morning is negative. Pharmacy has been consulted for cefepime dosing.  Afebrile, wbc wnl. Renal function normal  Plan: Cefepime 2g IV Q12H>>set to end 2/1 Pharmacy to sign off at this time and follow peripherally  **Butrans patch on order to arrive 1/29 or 2/1  Height: 5\' 4"  (162.6 cm) Weight: 209 lb 3.5 oz (94.9 kg) IBW/kg (Calculated) : 54.7  Temp (24hrs), Avg:97.7 F (36.5 C), Min:97.3 F (36.3 C), Max:98.4 F (36.9 C)  Recent Labs  Lab 03/24/19 0255 03/24/19 0255 03/24/19 0730 03/25/19 0349 03/26/19 0416 03/27/19 0352 03/28/19 0530  WBC 13.0*   < > 12.7* 10.0 11.3* 10.4 6.8  CREATININE 0.95  --  0.93 0.76 0.70 0.75  --   LATICACIDVEN 1.5  --   --   --   --   --   --    < > = values in this interval not displayed.    Estimated Creatinine Clearance: 54.3 mL/min (by C-G formula based on SCr of 0.75 mg/dL).    Allergies  Allergen Reactions  . Aleve [Naproxen] Rash  . Mobic [Meloxicam] Rash  . Tape Rash    Antimicrobials this admission: Cefepime 1/24 >> (2/1)  Microbiology results: 1/25 COVID/Flu: negative 1/25 MRSA PCR: negative 1/25 UCx: ng 1/25 BCx x2: coag neg staph 1/2  Erin Hearing PharmD., BCPS Clinical Pharmacist 03/28/2019 9:45 AM   Please check AMION for all [place] phone numbers After 10:00 PM, call the [place] [phone]

## 2019-03-28 NOTE — Progress Notes (Signed)
Patient rhythm remains in afib rate 110-120.Text paged NP Ardith Dark.

## 2019-03-28 NOTE — Progress Notes (Signed)
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 ETT 1/25-1/26. CTA reveals No PE, Bl upper lobe atelectasis /or infiltrate, bl lobe consolidation, and moderate bilateral pleural effusions.   OT comments  Pt making good progress with functional goals. Session focused on bed mobility to sit EOB. ADLS seated at EOB, pt stood at RW max - mod A and was able to SPT to [facility]. Pt stood at [facility] for hygiene and then SPT to recliner. Pt's daughter present this session. Pt and daughter planning for CIR transfer tomorrow  Follow Up Recommendations  CIR;Supervision/Assistance - 24 hour    Equipment Recommendations  3 in 1 bedside commode    Recommendations for Other Services      Precautions / Restrictions Precautions Precautions: Fall Restrictions Weight Bearing Restrictions: Yes LLE Weight Bearing: Weight bearing as tolerated       Mobility Bed Mobility Overal bed mobility: Needs Assistance Bed Mobility: Supine to Sit     Supine to sit: Mod assist;HOB elevated     General bed mobility comments: cues for technique assisted with R leg to scoot to EOB, assist to lift trunk and for L LE  Transfers Overall transfer level: Needs assistance Equipment used: Rolling walker (2 wheeled) Transfers: Sit to/from Stand Sit to Stand: Max assist Stand pivot transfers: Mod assist       General transfer comment: max A to power up/lift from EOB to RW. Assisted managing RW    Balance Overall balance assessment: Needs assistance Sitting-balance support: Bilateral upper extremity supported;Feet supported Sitting balance-Leahy Scale: Fair     Standing balance support:  Bilateral upper extremity supported;During functional activity Standing balance-Leahy Scale: Poor                             ADL either performed or assessed with clinical judgement   ADL Overall ADL's : Needs assistance/impaired Eating/Feeding: Set up;Independent;Sitting   Grooming: Wash/dry hands;Wash/dry face;Set up;Supervision/safety;Sitting   Upper Body Bathing: Min guard;Sitting   Lower Body Bathing: Maximal assistance;Sitting/lateral leans;Sit to/from stand   Upper Body Dressing : Min guard;Sitting       Toilet Transfer: Maximal assistance;Moderate assistance;RW;Cueing for safety;Cueing for sequencing;Stand-pivot   Toileting- Clothing Manipulation and Hygiene: Total assistance;Sit to/from stand Toileting - Clothing Manipulation Details (indicate cue type and reason): incontinent BM, total assist for hygiene while standing     Functional mobility during ADLs: Maximal assistance;Moderate assistance;Cueing for safety;Cueing for sequencing;Rolling walker       Vision Patient Visual Report: No change from baseline     Perception     Praxis      Cognition Arousal/Alertness: Awake/alert Behavior During Therapy: WFL for tasks assessed/performed Overall Cognitive Status: Within Functional Limits for tasks assessed                                          Exercises     Shoulder Instructions       General Comments      Pertinent Vitals/ Pain       Pain Assessment: 0-10 Pain  Score: 5  Faces Pain Scale: Hurts a little bit Pain Location: L LE Pain Descriptors / Indicators: Aching;Sore;Discomfort Pain Intervention(s): Limited activity within patient's tolerance;Monitored during session;Repositioned  Home Living                                          Prior Functioning/Environment              Frequency  Min 2X/week        Progress Toward Goals  OT Goals(current goals can now be found in the care  plan section)  Progress towards OT goals: Progressing toward goals     Plan Discharge plan remains appropriate    Co-evaluation                 AM-PAC OT "6 Clicks" Daily Activity     Outcome Measure   Help from another person eating meals?: None Help from another person taking care of personal grooming?: A Little Help from another person toileting, which includes using toliet, bedpan, or urinal?: Total Help from another person bathing (including washing, rinsing, drying)?: A Lot Help from another person to put on and taking off regular upper body clothing?: A Little Help from another person to put on and taking off regular lower body clothing?: Total 6 Click Score: 14    End of Session Equipment Utilized During Treatment: Gait belt;Rolling walker;Oxygen  OT Visit Diagnosis: Unsteadiness on feet (R26.81);Other abnormalities of gait and mobility (R26.89);Muscle weakness (generalized) (M62.81);Pain Pain - Right/Left: Left Pain - part of body: Hip;Leg   Activity Tolerance Patient tolerated treatment well   Patient Left in chair;with call bell/phone within reach;with nursing/sitter in room;with family/visitor present   Nurse Communication          Time: [id number] OT Time Calculation (min): 26 min  Charges: OT General Charges $OT Visit: 1 Visit OT Treatments $Self Care/Home Management : 8-22 mins $Therapeutic Activity: 8-22 mins     Britt Bottom 03/28/2019, 1:24 PM

## 2019-03-28 NOTE — Progress Notes (Signed)
Inpatient Rehab Admissions Coordinator:   Met with pt at bedside and spoke to RN.  Rhythm is controlled this AM.  I have a bed for this pt to admit to CIR on Saturday (1/30).  Dr. Maylene Roes in agreement.  Rehab MD (Dr. Ranell Patrick) to assess pt and confirm admission on Saturday.  Floor RN can call CIR at [phone] for report after 12pm on Saturday.  I have let pt/family and case manager know.    Shann Medal, PT, DPT Admissions Coordinator [phone] 03/28/19  10:24 AM

## 2019-03-28 NOTE — Progress Notes (Signed)
New orders received and carried out.

## 2019-03-28 NOTE — PMR Pre-admission (Signed)
PMR Admission Coordinator Pre-Admission Assessment  Patient: Virginia Thompson is an 86 y.o., female MRN: [id number] DOB: 19-Sep-1928 Height: _0  (162.6 cm) Weight: 94.8 kg  Insurance Information HMO: yes    PPO:      PCP:      IPA:      80/20:      OTHER:  PRIMARY: UHC Dual Medicare/Medicaid      Policy#: [id number]      Subscriber: pt CM Name: Cristie Hem      Phone#: [phone]     Fax#: [phone] Pre-Cert#: [id number] auth for CIR provided by Cristie Hem at [facility] with update due to fax listed above on 2/3.       Employer:  Benefits:  Phone #: [phone]     Name:  Eff. Date: 02/28/19     Deduct: $0      Out of Pocket Max: $7550 [phone] met)      Life Max: n/a CIR: $1400/admission copay      SNF: 100 full days Outpatient: 100%     Co-Pay: Home Health: 100%      Co-Pay:  DME: 80%     Co-Pay: 20% Providers:   SECONDARY:       Policy#:       Subscriber:  CM Name:       Phone#:      Fax#:  Pre-Cert#:       Employer:  Benefits:  Phone #:      Name:  Eff. Date:      Deduct:       Out of Pocket Max:       Life Max:  CIR:       SNF:  Outpatient:      Co-Pay:  Home Health:       Co-Pay:  DME:      Co-Pay:   Medicaid Application Date:       Case Manager:  Disability Application Date:       Case Worker:   The "Data Collection Information Summary" for patients in Inpatient Rehabilitation Facilities with attached "Privacy Act [place] Records" was provided and verbally reviewed with: Patient and Family  Emergency Contact Information Contact Information    Name Relation Home Work Mobile   [name],[name] Daughter   [phone]      Current Medical History  Patient Admitting Diagnosis: PNA with respiratory failure, L hip fracture  History of Present Illness: Pt is an 86 y/o female with PMH of COPD and OSA on 2L [place] at baseline (CPAP qhs), heart failure, ovarian cancer, hypertension and 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 Marcelino Scot on 1/8.  Pt was initially discharged to SNF for rehab, and while at SNF developed worsening respiratory failure and EMS was called.  On EMS on arrival, pt noted to be hypoxic with saturations in 60s.  In ED on 1/25, pt initially febrile, hypotensive and tachycardic.  Workup revealed pulmonary edema vs pneumonia.  Pt with worsening mental status, requiring intubation in ED.  Pt started on levophed for tight BP control.  Pt was extubated on 1/26 and transferred out of ICU on 1/28.  Hospital course further complicated with Afib with RVR, returned to NSR when home metoprolol started.  Over the weekend prior to admission pt with episodes of desaturations into the upper 70s, requiring increased O2 and medication adjustments.  Therapy evaluations were completed and pt was recommended for CIR.     Patient's medical  record from [facility] has been reviewed by the rehabilitation admission coordinator and physician.  Past Medical History  Past Medical History:  Diagnosis Date  . Blood transfusion without reported diagnosis   . Cancer ([place])   . CHF (congestive heart failure) ([place])   . Hypertension   . Hypertension   . Lymphoma ([place])   . Sleep apnea     Family History   family history is not on file.  Prior Rehab/Hospitalizations Has the patient had prior rehab or hospitalizations prior to admission? Yes  Has the patient had major surgery during 100 days prior to admission? Yes   Current Medications  Current Facility-Administered Medications:  .  0.9 %  sodium chloride infusion, , Intravenous, PRN, Dessa Phi, DO, Last Rate: 10 mL/hr at 03/31/19 0332, Rate Verify at 03/31/19 0332 .  acetaminophen (TYLENOL) tablet 650 mg, 650 mg, Oral, Q6H PRN, Chesley Mires, MD, 650 mg at 03/30/19 2045 .  albuterol (PROVENTIL) (2.5 MG/3ML) 0.083% nebulizer solution 2.5 mg, 2.5 mg, Nebulization, Q4H PRN, Dessa Phi, DO, 2.5 mg at 03/31/19 0809 .  aspirin chewable tablet 81 mg,  81 mg, Oral, Daily, Chesley Mires, MD, 81 mg at 03/31/19 1033 .  buprenorphine (BUTRANS) 5 MCG/HR 1 patch, 1 patch, Transdermal, Q Jonita Albee, MD, 1 patch at 03/28/19 1417 .  chlorhexidine gluconate (MEDLINE KIT) (PERIDEX) 0.12 % solution 15 mL, 15 mL, Mouth Rinse, BID, Sood, Vineet, MD, 15 mL at 03/31/19 0850 .  Chlorhexidine Gluconate Cloth 2 % PADS 6 each, 6 each, Topical, Q0600, Chesley Mires, MD, 6 each at 03/30/19 1200 .  DULoxetine (CYMBALTA) DR capsule 60 mg, 60 mg, Oral, Daily, Sood, Vineet, MD, 60 mg at 03/31/19 1033 .  furosemide (LASIX) tablet 60 mg, 60 mg, Oral, Daily, Dessa Phi, DO, 60 mg at 03/31/19 1033 .  guaiFENesin (ROBITUSSIN) 100 MG/5ML solution 100 mg, 5 mL, Oral, Q4H PRN, Dessa Phi, DO .  heparin injection 5,000 Units, 5,000 Units, Subcutaneous, Q8H, Gleason, Otilio Carpen, PA-C, 5,000 Units at 03/30/19 2046 .  ipratropium-albuterol (DUONEB) 0.5-2.5 (3) MG/3ML nebulizer solution 3 mL, 3 mL, Nebulization, Q6H PRN, Dessa Phi, DO, 3 mL at 03/30/19 0939 .  MEDLINE mouth rinse, 15 mL, Mouth Rinse, BID, Chesley Mires, MD, 15 mL at 03/30/19 2051 .  metoprolol tartrate (LOPRESSOR) tablet 12.5 mg, 12.5 mg, Oral, BID, Dessa Phi, DO, 12.5 mg at 03/31/19 1033 .  midodrine (PROAMATINE) tablet 10 mg, 10 mg, Oral, TID WC, Sood, Vineet, MD, 10 mg at 03/31/19 1200 .  oxyCODONE (Oxy IR/ROXICODONE) immediate release tablet 2.5 mg, 2.5 mg, Oral, Q6H PRN, Madalyn Rob, MD, 2.5 mg at 03/26/19 1127 .  pantoprazole (PROTONIX) EC tablet 40 mg, 40 mg, Oral, Daily, Madalyn Rob, MD, 40 mg at 03/31/19 1033 .  pramipexole (MIRAPEX) tablet 1 mg, 1 mg, Oral, QHS, Sood, Vineet, MD, 1 mg at 03/30/19 2046 .  predniSONE (DELTASONE) tablet 50 mg, 50 mg, Oral, Q breakfast, Dessa Phi, DO, 50 mg at 03/31/19 0835 .  senna-docusate (Senokot-S) tablet 2 tablet, 2 tablet, Oral, BID, Chesley Mires, MD, 2 tablet at 03/31/19 1033 .  sodium chloride flush (NS) 0.9 % injection 10-40 mL, 10-40 mL,  Intracatheter, Q12H, Chesley Mires, MD, 10 mL at 03/30/19 2051 .  sodium chloride flush (NS) 0.9 % injection 10-40 mL, 10-40 mL, Intracatheter, PRN, Halford Chessman, Vineet, MD .  umeclidinium bromide (INCRUSE ELLIPTA) 62.5 MCG/INH 1 puff, 1 puff, Inhalation, Daily, Collier Bullock, MD, 1 puff at 03/31/19 0805  Patients Current Diet:  Diet Order            DIET DYS 3 Room service appropriate? Yes with Assist; Fluid consistency: Thin; Fluid restriction: 1500 mL Fluid  Diet effective now              Precautions / Restrictions Precautions Precautions: Fall Restrictions Weight Bearing Restrictions: No LLE Weight Bearing: Weight bearing as tolerated   Has the patient had 2 or more falls or a fall with injury in the past year? No  Prior Activity Level Limited Community (1-2x/wk): was not driving, daughter assisting some with IADLs and self bathing getting difficult  Prior Functional Level Self Care: Did the patient need help bathing, dressing, using the toilet or eating? Needed some help  Indoor Mobility: Did the patient need assistance with walking from room to room (with or without device)? Independent  Stairs: Did the patient need assistance with internal or external stairs (with or without device)? Needed some help  Functional Cognition: Did the patient need help planning regular tasks such as shopping or remembering to take medications? Needed some help  Home Assistive Devices / [place] Devices/Equipment: CPAP, Shower chair without back, Environmental consultant (specify type), Wheelchair Home Equipment: Environmental consultant - 2 wheels, Walker - 4 wheels, Bedside commode, Wheelchair - manual, Grab bars - tub/shower  Prior Device Use: Indicate devices/aids used by the patient prior to current illness, exacerbation or injury? Walker  Current Functional Level Cognition  Overall Cognitive Status: Within Functional Limits for tasks assessed Orientation Level: Oriented X4    Extremity  Assessment (includes Sensation/Coordination)  Upper Extremity Assessment: Defer to OT evaluation  Lower Extremity Assessment: LLE deficits/detail LLE Deficits / Details: AAROM limited to about 50 degrees knee flexion, 3/5 knee extension strength, 2/5 hip flexion LLE Sensation: history of peripheral neuropathy    ADLs  Overall ADL's : Needs assistance/impaired Eating/Feeding: Set up, Independent, Sitting Grooming: Wash/dry hands, Wash/dry face, Set up, Supervision/safety, Sitting Upper Body Bathing: Min guard, Sitting Lower Body Bathing: Maximal assistance, Sitting/lateral leans, Sit to/from stand Upper Body Dressing : Min guard, Sitting Lower Body Dressing: Total assistance, +2 for physical assistance, Sit to/from stand Lower Body Dressing Details (indicate cue type and reason): to don socks, mod assist +2 sit to stand  Toilet Transfer: Maximal assistance, Moderate assistance, RW, Cueing for safety, Cueing for sequencing, Stand-pivot Toilet Transfer Details (indicate cue type and reason): simulated to recliner  Toileting- Clothing Manipulation and Hygiene: Total assistance, Sit to/from stand Toileting - Clothing Manipulation Details (indicate cue type and reason): incontinent BM, total assist for hygiene while standing Functional mobility during ADLs: Maximal assistance, Moderate assistance, Cueing for safety, Cueing for sequencing, Rolling walker General ADL Comments: limited by weakness, pain in L hip, impaired balance and decreased activity tolerance    Mobility  Overal bed mobility: Needs Assistance Bed Mobility: Supine to Sit Supine to sit: Mod assist, HOB elevated General bed mobility comments: cues for technique assisted with R leg to scoot to EOB, assist to lift trunk and for L LE    Transfers  Overall transfer level: Needs assistance Equipment used: Rolling walker (2 wheeled) Transfers: Sit to/from Stand Sit to Stand: Max assist Stand pivot transfers: Mod assist General  transfer comment: max A to power up/lift from EOB to RW. Assisted managing RW    Ambulation / Gait / Stairs / Wheelchair Mobility  Ambulation/Gait Ambulation/Gait assistance: Mod assist, +2 physical assistance Gait Distance (Feet): 3 Feet Assistive device: Rolling walker (2 wheeled) Gait Pattern/deviations: Step-to pattern, Decreased stride length,  Shuffle, Trunk flexed General Gait Details: cues for technique to use walker to unweight R LE and step with L    Posture / Balance Dynamic Sitting Balance Sitting balance - Comments: min assist fading to close supervision, preference to UE support Balance Overall balance assessment: Needs assistance Sitting-balance support: Bilateral upper extremity supported, Feet supported Sitting balance-Leahy Scale: Fair Sitting balance - Comments: min assist fading to close supervision, preference to UE support Standing balance support: Bilateral upper extremity supported, During functional activity Standing balance-Leahy Scale: Poor Standing balance comment: relaint on BUE and external support    Special needs/care consideration BiPAP/CPAP yes CPM no Continuous Drip IV no Dialysis no        Days n/a Life Vest no Oxygen 3L Special Bed no Trach Size no Wound Vac (area) no      Location n/a Skin no                              Location n/a Bowel mgmt: incontinent Bladder mgmt: incontinent Diabetic mgmt: no Behavioral consideration no Chemo/radiation no   Previous Home Environment (from acute therapy documentation) Living Arrangements: Other relatives Available Help at Discharge: Family, Available 24 hours/day Type of Home: [place] Name: [place] Home Layout: One level Home Access: Ramped entrance Bathroom Shower/Tub: Chiropodist: Standard Home Care Services: Other (Comment)  Discharge Living Setting Plans for Discharge Living Setting: Patient's home, Lives with (comment)(daughter) Type of Home at  Discharge: House Discharge Home Layout: One level Discharge Home Access: [place] entrance Discharge Bathroom Shower/Tub: Tub/shower unit Discharge Bathroom Toilet: Standard Discharge Bathroom Accessibility: Yes How Accessible: Accessible via walker Does the patient have any problems obtaining your medications?: No  Social/Family/Support Systems Anticipated Caregiver: daughter, Trinda Pascal Anticipated Caregiver's Contact Information: [phone] Ability/Limitations of Caregiver: supervision/min assist Caregiver Availability: 24/7 Discharge Plan Discussed with Primary Caregiver: Yes Is Caregiver In Agreement with Plan?: Yes Does Caregiver/Family have Issues with Lodging/Transportation while Pt is in Rehab?: No  Goals/Additional Needs Patient/Family Goal for Rehab: PT/OT supervision to min assist Expected length of stay: 18-21 days Dietary Needs: dys 3/thin Pt/Family Agrees to Admission and willing to participate: Yes Program Orientation Provided & Reviewed with Pt/Caregiver Including Roles  & Responsibilities: Yes  Decrease burden of Care through IP rehab admission: n/a  Possible need for SNF placement upon discharge: Not anticipated.   Patient Condition: I have reviewed medical records from [facility], spoken with CM, and patient and daughter. I met with patient at the bedside and discussed via phone for inpatient rehabilitation assessment.  Patient will benefit from ongoing PT and OT, can actively participate in 3 hours of therapy a day 5 days of the week, and can make measurable gains during the admission.  Patient will also benefit from the coordinated team approach during an Inpatient Acute Rehabilitation admission.  The patient will receive intensive therapy as well as Rehabilitation physician, nursing, social worker, and care management interventions.  Due to bladder management, bowel management, safety, skin/wound care, disease management, medication administration, pain  management and patient education the patient requires 24 hour a day rehabilitation nursing.  The patient is currently mod assist with mobility and basic ADLs.  Discharge setting and therapy post discharge at home is anticipated.  Patient has agreed to participate in the Acute Inpatient Rehabilitation Program and will admit today.  Preadmission Screen Completed By:  Michel Santee, PT, DPT 03/31/2019 12:02 PM ______________________________________________________________________   Discussed status  with Dr. Letta Pate on 03/31/19  at 12:02 PM  and received approval for admission today.  Admission Coordinator:  Michel Santee, PT, DPT time 12:02 PM Sudie Grumbling 03/31/19    Assessment/Plan: Diagnosis: Displaced femoral shaft fracture requiring IM nail 1. Does the need for close, 24 hr/day Medical supervision in concert with the patient's rehab needs make it unreasonable for this patient to be served in a less intensive setting? Yes 2. Co-Morbidities requiring supervision/potential complications: COPD, glucocorticoid induced osteoporosis 3. Due to bladder management, bowel management, safety, skin/wound care, disease management, medication administration, pain management and patient education, does the patient require 24 hr/day rehab nursing? Yes 4. Does the patient require coordinated care of a physician, rehab nurse, PT, OT, and SLP to address physical and functional deficits in the context of the above medical diagnosis(es)? Yes Addressing deficits in the following areas: balance, endurance, locomotion, strength, transferring, bowel/bladder control, bathing, dressing, feeding, grooming, toileting and psychosocial support 5. Can the patient actively participate in an intensive therapy program of at least 3 hrs of therapy 5 days a week? Yes 6. The potential for patient to make measurable gains while on inpatient rehab is good 7. Anticipated functional outcomes upon discharge from inpatient rehab: modified  independent PT, supervision OT, n/a SLP 8. Estimated rehab length of stay to reach the above functional goals is: 18 to 21 days 9. Anticipated discharge destination: Home 10. Overall Rehab/Functional Prognosis: good   MD Signature: Charlett Blake M.D. [facility] FAAPM&R (Neuromuscular Med) Diplomate Am Board of Electrodiagnostic Med Fellow Am Board of Interventional Pain

## 2019-03-29 ENCOUNTER — Other Ambulatory Visit: Payer: Self-pay

## 2019-03-29 ENCOUNTER — Encounter (HOSPITAL_COMMUNITY): Payer: Self-pay | Admitting: Pulmonary Disease

## 2019-03-29 LAB — BASIC METABOLIC PANEL
Anion gap: 6 (ref 5–15)
BUN: 15 mg/dL (ref 8–23)
CO2: 39 mmol/L — ABNORMAL HIGH (ref 22–32)
Calcium: 8.3 mg/dL — ABNORMAL LOW (ref 8.9–10.3)
Chloride: 93 mmol/L — ABNORMAL LOW (ref 98–111)
Creatinine, Ser: 0.48 mg/dL (ref 0.44–1.00)
GFR calc Af Amer: >60 mL/min (ref >60)
GFR calc non Af Amer: >60 mL/min (ref >60)
Glucose, Bld: 100 mg/dL — ABNORMAL HIGH (ref 70–99)
Potassium: 3.4 mmol/L — ABNORMAL LOW (ref 3.5–5.1)
Sodium: 138 mmol/L (ref 135–145)

## 2019-03-29 LAB — MAGNESIUM: Magnesium: 2.3 mg/dL (ref 1.7–2.4)

## 2019-03-29 LAB — CULTURE, BLOOD (ROUTINE X 2): Culture: NO GROWTH

## 2019-03-29 MED ORDER — IPRATROPIUM-ALBUTEROL 0.5-2.5 (3) MG/3ML IN SOLN
3.0000 mL | Freq: Four times a day (QID) | RESPIRATORY_TRACT | Status: DC | PRN
  Administered 2019-03-29 – 2019-03-30 (×2): 3 mL via RESPIRATORY_TRACT
  Filled 2019-03-29 (×2): qty 3

## 2019-03-29 MED ORDER — PREDNISONE 50 MG PO TABS
50.0000 mg | ORAL_TABLET | Freq: Every day | ORAL | Status: DC
  Administered 2019-03-30 – 2019-03-31 (×2): 50 mg via ORAL
  Filled 2019-03-29 (×2): qty 1

## 2019-03-29 MED ORDER — ALBUTEROL SULFATE (2.5 MG/3ML) 0.083% IN NEBU
2.5000 mg | INHALATION_SOLUTION | RESPIRATORY_TRACT | Status: DC | PRN
  Administered 2019-03-31: 2.5 mg via RESPIRATORY_TRACT
  Filled 2019-03-29: qty 3

## 2019-03-29 MED ORDER — POTASSIUM CHLORIDE CRYS ER 20 MEQ PO TBCR
40.0000 meq | EXTENDED_RELEASE_TABLET | Freq: Once | ORAL | Status: AC
  Administered 2019-03-29: 09:00:00 40 meq via ORAL
  Filled 2019-03-29: qty 2

## 2019-03-29 MED ORDER — SODIUM CHLORIDE 0.9 % IV SOLN
INTRAVENOUS | Status: DC | PRN
  Administered 2019-03-29: 09:00:00 250 mL via INTRAVENOUS
  Administered 2019-03-29: 23:00:00 1000 mL via INTRAVENOUS

## 2019-03-29 MED ORDER — METHYLPREDNISOLONE SODIUM SUCC 40 MG IJ SOLR
40.0000 mg | Freq: Once | INTRAMUSCULAR | Status: AC
  Administered 2019-03-29: 10:00:00 40 mg via INTRAVENOUS
  Filled 2019-03-29: qty 1

## 2019-03-29 NOTE — Progress Notes (Signed)
Pt c/o chest tightness after eating breakfast, pt was slouched in bed without O2 in place.   Pt repositioned, actively coughing c/o increased discomfort with cough and tenderness when palpating sternal region.   Pt with baseline need for 2L O2 at home, which was provided upon noticing it had not been in place once CPAP was removed.   Pt has AM inhaler, will call respiratory to administer.  Spoke with Dr Maylene Roes and requeasted PRN nebulizer treatment for patient as well.

## 2019-03-29 NOTE — Progress Notes (Signed)
RT notified of new PRN orders and need for those when patient receives her morning inhaler.   Dr Maylene Roes has been to bedside to see patient since initial page, she has added IV steroids to patient's orders.   RT in rapid situation at the moment, will return at next availability.  Pt reports improvement in symptoms already since reposition/O2 in place.

## 2019-03-29 NOTE — Plan of Care (Signed)
  Problem: Education: Goal: Knowledge of General Education information will improve Description Including pain rating scale, medication(s)/side effects and non-pharmacologic comfort measures Outcome: Progressing   

## 2019-03-29 NOTE — Progress Notes (Signed)
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  Per EMS report, CPAP machine was not working and oxygen saturations were 69%; patient arrived on 15 L nonrebreather.  Patient was initially febrile, hypotensive and mildly tachycardic and sepsis protocol initiated and patient was given approximately 750 cc IV fluid. She was given cefepime and flagyl.  Work-up revealed pulmonary edema versus atypical infiltrates on chest x-ray, negative Covid-19 and influenza, bland UA. ABG showed respiratory acidosis with pH of 7.2 and PCO2 of 84, BiPAP initiated for approximately 45 minutes however patient's mental status was worsening and she was intubated in the ED. Patient was admitted to critical care service, extubated on 1/26 and transferred to telemetry unit on 1/28.  New events last 24 hours / Subjective: Complained of some chest tightness this morning. She was found not to be on her home 2L O2 once CPAP was taken off. She was repositioned and given breathing tx, given 1 dose solumedrol IV due to wheezing.   She had no further complaints, but was found to be hypoxemic with SpO2 78% and requiring inc in O2 to 4L [place].   Assessment & Plan:   Principal Problem:   Acute on chronic respiratory failure with hypoxia and hypercapnia (HCC) Active Problems:   Acute on chronic diastolic CHF (congestive heart failure) (HCC)   Autoimmune hemolytic anemia   Obstructive sleep apnea on CPAP   Hypotension   Aspiration pneumonia (HCC)   Acute on chronic hypoxemic and hypercapnic respiratory  failure -Thought to be secondary to opiate medication use after hip surgery in setting of OSA, COPD, CHF exacerbation, aspiration pneumonia -Cefepime for 7 days -Patient has been extubated 1/26 -On 2 L nasal cannula O2 at baseline, CPAP nightly -On 4 L [place] this morning   COPD exacerbation -Increase prednisone dose, breathing tx, antibiotics as above    A Fib RVR -No previous documentation or diagnosis of A Fib. She was given digoxin with return to NSR this morning. Her home metoprolol has been held during hospitalization, which I will resume. CHADSVASC 5. She is on baby aspirin at baseline. Discussed in detail with daughter regarding benefit of starting anticoagulation vs risk of bleed. Patient has fallen in the past, but not so much recently. She does have a PCP and cardiologist at [facility], although has not seen her cardiologist in a while. Daughter was hesitant on starting anticoagulation today. I've recommended patient to have close follow up with her physicians who are familiar with patient and discuss further with them regarding the benefit and risk of anticoagulation in this 86 yo patient.  -Continue metoprolol, aspirin   Acute on chronic diastolic heart failure -Echocardiogram 03/24/2019 revealed EF 60 to 95%, grade 1 diastolic dysfunction -Received IV Lasix while in ICU.  Resume home Lasix -BNP 2540 --> 1416  -Does not seem to be overtly fluid overloaded today   Chronic hypotension -Continue midodrine -Baseline BP around 100/50 per daughter   History of autoimmune hemolytic anemia -Continue prednisone   Recent nontraumatic left hip fracture status post IM nailing -Judicious use of narcotics.  Resume home buprenorphine patch  when available from pharmacy   Gram-positive cocci 1 out of 4 blood culture -Coag negative staph.  This is thought to be a contaminant  Incidental finding of left apical lung nodule -CTA chest 03/24/2019 revealed 4 mm noncalcified left apical lung nodule. This  represents a new finding when compared to the prior study dated August 01, 2006. Correlation with 12 month follow-up chest CT is recommended to determine stability.  Depression -Continue Cymbalta  Elevated d-dimer -Patient has had negative CTA chest for PE at time of admission. My suspicion for PE is low. She has multiple other medical issues as above which could cause her respiratory symptoms as well as non-VTE related elevation in [place].   Hypokalemia -Replace, trend    DVT prophylaxis: Subcutaneous heparin Code Status: Full code Family Communication: None at bedside Disposition Plan: Patient is from SNF prior to admission. Currently in-hospital treatment needed due to respiratory failure, slightly worsened today. A Fib RVR resolved. Plan for CIR discharge, hopefully 1/31 if stable from respiratory standpoint.    Consultants:   PCCM admission   Antimicrobials:  Anti-infectives (From admission, onward)   Start     Dose/Rate Route Frequency Ordered Stop   03/24/19 1315  ceFEPIme (MAXIPIME) 2 g in sodium chloride 0.9 % 100 mL IVPB     2 g 200 mL/hr over 30 Minutes Intravenous Every 12 hours 03/24/19 1302 03/31/19 0959   03/24/19 0315  vancomycin (VANCOREADY) IVPB 2000 mg/400 mL  Status:  Discontinued     2,000 mg 200 mL/hr over 120 Minutes Intravenous  Once 03/24/19 0300 03/24/19 1321   03/24/19 0300  ceFEPIme (MAXIPIME) 2 g in sodium chloride 0.9 % 100 mL IVPB     2 g 200 mL/hr over 30 Minutes Intravenous  Once 03/24/19 0255 03/24/19 0410   03/24/19 0300  metroNIDAZOLE (FLAGYL) IVPB 500 mg     500 mg 100 mL/hr over 60 Minutes Intravenous  Once 03/24/19 0255 03/24/19 0434   03/24/19 0300  vancomycin (VANCOCIN) IVPB 1000 mg/200 mL premix  Status:  Discontinued     1,000 mg 200 mL/hr over 60 Minutes Intravenous  Once 03/24/19 0255 03/24/19 0300       Objective: Vitals:   03/29/19 0541 03/29/19 0702 03/29/19 0930 03/29/19 1136  BP: (!) 99/54  (!) 103/53 (!) 115/54  Pulse:  82 78 87 80  Resp: 19 16  18   Temp: 97.8 F (36.6 C)     TempSrc: Oral     SpO2: 94% 100%    Weight:      Height:        Intake/Output Summary (Last 24 hours) at 03/29/2019 1141 Last data filed at 03/29/2019 0938 Gross per 24 hour  Intake 2585.91 ml  Output 1700 ml  Net 885.91 ml   Filed Weights   03/27/19 0500 03/28/19 0027 03/29/19 0215  Weight: 96.3 kg 94.9 kg 94.6 kg    Examination: General exam: Appears calm and comfortable  Respiratory system: + Expiratory wheezes bilaterally Cardiovascular system: S1 & S2 heard, RRR. No pedal edema. Gastrointestinal system: Abdomen is nondistended, soft and nontender. Normal bowel sounds heard. Central nervous system: Alert and oriented. Non focal exam. Speech clear  Extremities: Symmetric in appearance bilaterally  Skin: No rashes, lesions or ulcers on exposed skin  Psychiatry: Judgement and insight appear stable. Mood & affect appropriate.    Data Reviewed: I have personally reviewed following labs and imaging studies  CBC: Recent Labs  Lab 03/24/19 0255 03/24/19 0344 03/24/19 0730 03/25/19  7588 03/26/19 0416 03/27/19 0352 03/28/19 0530  WBC 13.0*   < > 12.7* 10.0 11.3* 10.4 6.8  NEUTROABS 9.9*  --   --   --   --   --   --   HGB 9.7*   < > 9.1* 8.7* 8.2* 8.6* 9.2*  HCT 33.1*   < > 31.6* 28.6* 27.3* 28.8* 31.7*  MCV 106.4*   < > 106.0* 100.4* 102.2* 104.0* 103.9*  PLT 450*   < > 420* 377 324 299 299   < > = values in this interval not displayed.   Basic Metabolic Panel: Recent Labs  Lab 03/24/19 0255 03/24/19 0255 03/24/19 0344 03/24/19 0541 03/24/19 0730 03/25/19 0349 03/26/19 0416 03/27/19 0352 03/29/19 0306  NA 139  --    < > 134*  --  141 137 138 138  K 4.0  --    < > 3.9  --  3.4* 3.5 3.7 3.4*  CL 91*  --   --   --   --  94* 92* 94* 93*  CO2 36*  --   --   --   --  36* 36* 36* 39*  GLUCOSE 119*  --   --   --   --  133* 157* 109* 100*  BUN 18  --   --   --   --  20 24* 32* 15  CREATININE 0.95   < >  --    --  0.93 0.76 0.70 0.75 0.48  CALCIUM 8.3*  --   --   --   --  8.1* 7.7* 8.1* 8.3*  MG  --   --   --   --   --  2.1 2.3  --  2.3  PHOS  --   --   --   --   --  2.2* 3.1  --   --    < > = values in this interval not displayed.   GFR: Estimated Creatinine Clearance: 54.3 mL/min (by C-G formula based on SCr of 0.48 mg/dL). Liver Function Tests: Recent Labs  Lab 03/24/19 0255  AST 57*  ALT 50*  ALKPHOS 101  BILITOT 1.1  PROT 6.0*  ALBUMIN 3.0*   No results for input(s): LIPASE, AMYLASE in the last 168 hours. No results for input(s): AMMONIA in the last 168 hours. Coagulation Profile: Recent Labs  Lab 03/24/19 0255  INR 1.3*   Cardiac Enzymes: No results for input(s): CKTOTAL, CKMB, CKMBINDEX, TROPONINI in the last 168 hours. BNP (last 3 results) No results for input(s): PROBNP in the last 8760 hours. HbA1C: No results for input(s): HGBA1C in the last 72 hours. CBG: Recent Labs  Lab 03/27/19 0304 03/27/19 0744 03/27/19 1058 03/27/19 2036 03/28/19 0049  GLUCAP 107* 88 105* 106* 97   Lipid Profile: No results for input(s): CHOL, HDL, LDLCALC, TRIG, CHOLHDL, LDLDIRECT in the last 72 hours. Thyroid Function Tests: No results for input(s): TSH, T4TOTAL, FREET4, T3FREE, THYROIDAB in the last 72 hours. Anemia Panel: No results for input(s): VITAMINB12, FOLATE, FERRITIN, TIBC, IRON, RETICCTPCT in the last 72 hours. Sepsis Labs: Recent Labs  Lab 03/24/19 0255 03/25/19 0349 03/26/19 0416  PROCALCITON <0.10 0.14 <0.10  LATICACIDVEN 1.5  --   --     Recent Results (from the past 240 hour(s))  Respiratory Panel by RT PCR (Flu A&B, Covid) - Nasopharyngeal Swab     Status: None   Collection Time: 03/24/19  2:58 AM   Specimen: Nasopharyngeal Swab  Result Value  Ref Range Status   SARS Coronavirus 2 by RT PCR NEGATIVE NEGATIVE Final    Comment: (NOTE) SARS-CoV-2 target nucleic acids are NOT DETECTED. The SARS-CoV-2 RNA is generally detectable in upper  respiratoy specimens during the acute phase of infection. The lowest concentration of SARS-CoV-2 viral copies this assay can detect is 131 copies/mL. A negative result does not preclude SARS-Cov-2 infection and should not be used as the sole basis for treatment or other patient management decisions. A negative result may occur with  improper specimen collection/handling, submission of specimen other than nasopharyngeal swab, presence of viral mutation(s) within the areas targeted by this assay, and inadequate number of viral copies (<131 copies/mL). A negative result must be combined with clinical observations, patient history, and epidemiological information. The expected result is Negative. Fact Sheet for Patients:  PinkCheek.be Fact Sheet for Healthcare Providers:  GravelBags.it This test is not yet ap proved or cleared by the [place] FDA and  has been authorized for detection and/or diagnosis of SARS-CoV-2 by FDA under an Emergency Use Authorization (EUA). This EUA will remain  in effect (meaning this test can be used) for the duration of the COVID-19 declaration under Section 564(b)(1) of the Act, 21 U.S.C. section 360bbb-3(b)(1), unless the authorization is terminated or revoked sooner.    Influenza A by PCR NEGATIVE NEGATIVE Final   Influenza B by PCR NEGATIVE NEGATIVE Final    Comment: (NOTE) The Xpert Xpress SARS-CoV-2/FLU/RSV assay is intended as an aid in  the diagnosis of influenza from Nasopharyngeal swab specimens and  should not be used as a sole basis for treatment. Nasal washings and  aspirates are unacceptable for Xpert Xpress SARS-CoV-2/FLU/RSV  testing. Fact Sheet for Patients: PinkCheek.be Fact Sheet for Healthcare Providers: GravelBags.it This test is not yet approved or cleared by the [place] FDA and  has been authorized for  detection and/or diagnosis of SARS-CoV-2 by  FDA under an Emergency Use Authorization (EUA). This EUA will remain  in effect (meaning this test can be used) for the duration of the  Covid-19 declaration under Section 564(b)(1) of the Act, 21  U.S.C. section 360bbb-3(b)(1), unless the authorization is  terminated or revoked. Performed at [facility], [place] [address] [place], [place] [address]   Blood Culture (routine x 2)     Status: None (Preliminary result)   Collection Time: 03/24/19  3:00 AM   Specimen: BLOOD LEFT HAND  Result Value Ref Range Status   Specimen Description BLOOD LEFT HAND  Final   Special Requests   Final    BOTTLES DRAWN AEROBIC AND ANAEROBIC Blood Culture results may not be optimal due to an excessive volume of blood received in culture bottles   Culture   Final    NO GROWTH 4 DAYS Performed at [facility], [place] [address] [place], [place] [address]    Report Status PENDING  Incomplete  Blood Culture (routine x 2)     Status: Abnormal   Collection Time: 03/24/19  3:05 AM   Specimen: BLOOD  Result Value Ref Range Status   Specimen Description BLOOD LEFT ANTECUBITAL  Final   Special Requests   Final    BOTTLES DRAWN AEROBIC AND ANAEROBIC Blood Culture adequate volume   Culture  Setup Time   Final    AEROBIC BOTTLE ONLY GRAM POSITIVE COCCI CRITICAL RESULT CALLED TO, READ BACK BY AND VERIFIED WITH: KAREN AMEND  PHARM @ 0981 ON 03/25/19 BY ROBINSON Z.     Culture (  A)  Final    STAPHYLOCOCCUS SPECIES (COAGULASE NEGATIVE) THE SIGNIFICANCE OF ISOLATING THIS ORGANISM FROM A SINGLE SET OF BLOOD CULTURES WHEN MULTIPLE SETS ARE DRAWN IS UNCERTAIN. PLEASE NOTIFY THE MICROBIOLOGY DEPARTMENT WITHIN ONE WEEK IF SPECIATION AND SENSITIVITIES ARE REQUIRED. Performed at [facility], [place] [address], [place], [place] [address]    Report Status 03/26/2019 FINAL  Final  Urine culture     Status: None   Collection Time: 03/24/19  3:20 AM   Specimen: In/Out  Cath Urine  Result Value Ref Range Status   Specimen Description IN/OUT CATH URINE  Final   Special Requests NONE  Final   Culture   Final    NO GROWTH Performed at [facility], [place] [address], [place], [place] [address]    Report Status 03/24/2019 FINAL  Final  MRSA PCR Screening     Status: None   Collection Time: 03/24/19  7:42 AM   Specimen: Nasal Mucosa; Nasopharyngeal  Result Value Ref Range Status   MRSA by PCR NEGATIVE NEGATIVE Final    Comment:        The GeneXpert MRSA Assay (FDA approved for NASAL specimens only), is one component of a comprehensive MRSA colonization surveillance program. It is not intended to diagnose MRSA infection nor to guide or monitor treatment for MRSA infections. Performed at [facility], [place] [address], [place], [place] [address]       Radiology Studies: DG CHEST PORT 1 VIEW  Result Date: 03/28/2019 CLINICAL DATA:  Increased shortness of breath EXAM: PORTABLE CHEST 1 VIEW COMPARISON:  Chest CT from 4 days ago FINDINGS: Tracheal and esophageal extubation. Right PICC with tip at the lower SVC. There is interstitial coarsening and layering left pleural effusion. Cardiomegaly. Aeration has mildly improved from before. IMPRESSION: Interstitial opacities have mildly improved from comparison. There is a layering left pleural effusion which may have increased from study 4 days ago. Electronically Signed   By: Monte Fantasia M.D.   On: 03/28/2019 05:32      Scheduled Meds: . aspirin  81 mg Oral Daily  . buprenorphine  1 patch Transdermal Q Fri  . chlorhexidine gluconate (MEDLINE KIT)  15 mL Mouth Rinse BID  . Chlorhexidine Gluconate Cloth  6 each Topical Q0600  . DULoxetine  60 mg Oral Daily  . furosemide  60 mg Oral Daily  . heparin  5,000 Units Subcutaneous Q8H  . mouth rinse  15 mL Mouth Rinse BID  . metoprolol tartrate  12.5 mg Oral BID  . midodrine  10 mg Oral TID WC  . pantoprazole  40 mg Oral Daily  . pramipexole  1 mg  Oral QHS  . predniSONE  5 mg Oral Q breakfast  . senna-docusate  2 tablet Oral BID  . sodium chloride flush  10-40 mL Intracatheter Q12H  . umeclidinium bromide  1 puff Inhalation Daily   Continuous Infusions: . sodium chloride 250 mL (03/29/19 0926)  . ceFEPime (MAXIPIME) IV 2 g (03/29/19 9507)     LOS: 5 days      Time spent: 40 minutes   Dessa Phi, DO [facility] 03/29/2019, 11:41 AM   Available via Epic secure chat 7am-7pm After these hours, please refer to coverage provider listed on [email or web address]

## 2019-03-29 NOTE — Progress Notes (Addendum)
Patient resting comfortably during shift report. Denies complaints.  

## 2019-03-30 ENCOUNTER — Inpatient Hospital Stay (HOSPITAL_COMMUNITY): Payer: Medicare Other

## 2019-03-30 LAB — BASIC METABOLIC PANEL
Anion gap: 9 (ref 5–15)
BUN: 9 mg/dL (ref 8–23)
CO2: 37 mmol/L — ABNORMAL HIGH (ref 22–32)
Calcium: 8.3 mg/dL — ABNORMAL LOW (ref 8.9–10.3)
Chloride: 93 mmol/L — ABNORMAL LOW (ref 98–111)
Creatinine, Ser: 0.56 mg/dL (ref 0.44–1.00)
GFR calc Af Amer: >60 mL/min (ref >60)
GFR calc non Af Amer: >60 mL/min (ref >60)
Glucose, Bld: 105 mg/dL — ABNORMAL HIGH (ref 70–99)
Potassium: 3.7 mmol/L (ref 3.5–5.1)
Sodium: 139 mmol/L (ref 135–145)

## 2019-03-30 LAB — MAGNESIUM: Magnesium: 2.3 mg/dL (ref 1.7–2.4)

## 2019-03-30 MED ORDER — FUROSEMIDE 10 MG/ML IJ SOLN
60.0000 mg | Freq: Once | INTRAMUSCULAR | Status: AC
  Administered 2019-03-30: 09:00:00 60 mg via INTRAVENOUS
  Filled 2019-03-30: qty 6

## 2019-03-30 MED ORDER — FUROSEMIDE 40 MG PO TABS
60.0000 mg | ORAL_TABLET | Freq: Every day | ORAL | Status: DC
  Administered 2019-03-31: 11:00:00 60 mg via ORAL
  Filled 2019-03-30: qty 1

## 2019-03-30 NOTE — Plan of Care (Signed)
  Problem: Education: Goal: Knowledge of General Education information will improve Description: Including pain rating scale, medication(s)/side effects and non-pharmacologic comfort measures Outcome: Progressing   Problem: Health Behavior/Discharge Planning: Goal: Ability to manage health-related needs will improve Outcome: Progressing   Problem: Clinical Measurements: Goal: Respiratory complications will improve Outcome: Progressing   

## 2019-03-30 NOTE — Progress Notes (Signed)
O2 sats 76% with CPAP and O2 set at 2L. RN increased O2 to 4L.- sats 96%.

## 2019-03-30 NOTE — Progress Notes (Signed)
RUE with +1 edema and a little redness, no change since day shift. Will continue to monitor.

## 2019-03-30 NOTE — Progress Notes (Addendum)
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  Per EMS report, CPAP machine was not working and oxygen saturations were 69%; patient arrived on 15 L nonrebreather.  Patient was initially febrile, hypotensive and mildly tachycardic and sepsis protocol initiated and patient was given approximately 750 cc IV fluid. She was given cefepime and flagyl.  Work-up revealed pulmonary edema versus atypical infiltrates on chest x-ray, negative Covid-19 and influenza, bland UA. ABG showed respiratory acidosis with pH of 7.2 and PCO2 of 84, BiPAP initiated for approximately 45 minutes however patient's mental status was worsening and she was intubated in the ED. Patient was admitted to critical care service, extubated on 1/26 and transferred to telemetry unit on 1/28.  New events last 24 hours / Subjective: No complaints of SOB this morning. Had O2 sat 76% on 2L and had increase in O2 to 4L with improvement.   Assessment & Plan:   Principal Problem:   Acute on chronic respiratory failure with hypoxia and hypercapnia (HCC) Active Problems:   Acute on chronic diastolic CHF (congestive heart failure) (HCC)   Autoimmune hemolytic anemia   Obstructive sleep apnea on CPAP   Hypotension   Aspiration pneumonia (HCC)   Acute on chronic hypoxemic and hypercapnic respiratory failure -Thought to be secondary to opiate medication use after hip surgery in setting of OSA, COPD, CHF exacerbation, aspiration pneumonia -Cefepime for 7 days -Patient has been extubated 1/26 -On 2 L nasal  cannula O2 at baseline, CPAP nightly -On 3-4 L [place] this morning   COPD exacerbation -Increased prednisone dose to 58m daily, breathing tx, antibiotics as above    Acute on chronic diastolic heart failure -Echocardiogram 03/24/2019 revealed EF 60 to 626% grade 1 diastolic dysfunction -Received IV Lasix while in ICU -BNP 2540 --> 1416  -CXR obtained this morning, reviewed independently showing some interstitial edema and perihilar congestion. Will stop home PO lasix and give 1 time IV lasix today, watch respiratory status and urine output. 1506mfluid restriction.   A Fib RVR -No previous documentation or diagnosis of A Fib. She was given digoxin with return to NSR this morning. Her home metoprolol has been held during hospitalization, which I will resume. CHADSVASC 5. She is on baby aspirin at baseline. Discussed in detail with daughter regarding benefit of starting anticoagulation vs risk of bleed. Patient has fallen in the past, but not so much recently. [facility] while. Daughter was hesitant on starting anticoagulation today. I've recommended patient to have close follow up with her physicians who are familiar with patient and discuss further with them regarding the benefit and risk of anticoagulation in this [age] patient.  -Continue metoprolol, aspirin  -NSR   Chronic hypotension -Continue midodrine -Baseline BP around 100/50 per daughter   History of autoimmune hemolytic anemia -Continue prednisone   Recent nontraumatic left hip fracture status post IM nailing -Judicious use of narcotics.  Resume home buprenorphine patch when available from pharmacy   Gram-positive cocci 1 out of 4 blood culture -Coag  negative staph.  This is thought to be a contaminant  Incidental finding of left apical lung nodule -CTA chest 03/24/2019 revealed 4 mm noncalcified left apical lung nodule. This represents a new finding  when compared to the prior study dated August 01, 2006. Correlation with 12 month follow-up chest CT is recommended to determine stability.  Depression -Continue Cymbalta  Elevated d-dimer -Patient has had negative CTA chest for PE at time of admission. My suspicion for PE is low. She has multiple other medical issues as above which could cause her respiratory symptoms as well as non-VTE related elevation in D [place].     DVT prophylaxis: Subcutaneous heparin Code Status: Full code Family Communication: None at bedside; discussed with daughter over the phone this morning  Disposition Plan: Patient is from SNF prior to admission. Currently in-hospital treatment needed due to respiratory failure, slightly worsened today. A Fib RVR resolved. Give 1 time IV lasix today. Plan for CIR discharge, hopefully 2/1 if stable from respiratory standpoint.    Consultants:   PCCM admission   Antimicrobials:  Anti-infectives (From admission, onward)   Start     Dose/Rate Route Frequency Ordered Stop   03/24/19 1315  ceFEPIme (MAXIPIME) 2 g in sodium chloride 0.9 % 100 mL IVPB     2 g 200 mL/hr over 30 Minutes Intravenous Every 12 hours 03/24/19 1302 03/31/19 0959   03/24/19 0315  vancomycin (VANCOREADY) IVPB 2000 mg/400 mL  Status:  Discontinued     2,000 mg 200 mL/hr over 120 Minutes Intravenous  Once 03/24/19 0300 03/24/19 1321   03/24/19 0300  ceFEPIme (MAXIPIME) 2 g in sodium chloride 0.9 % 100 mL IVPB     2 g 200 mL/hr over 30 Minutes Intravenous  Once 03/24/19 0255 03/24/19 0410   03/24/19 0300  metroNIDAZOLE (FLAGYL) IVPB 500 mg     500 mg 100 mL/hr over 60 Minutes Intravenous  Once 03/24/19 0255 03/24/19 0434   03/24/19 0300  vancomycin (VANCOCIN) IVPB 1000 mg/200 mL premix  Status:  Discontinued     1,000 mg 200 mL/hr over 60 Minutes Intravenous  Once 03/24/19 0255 03/24/19 0300       Objective: Vitals:   03/30/19 0440 03/30/19 0440 03/30/19 0809 03/30/19 0940  BP:  (!) 101/48     Pulse:  80 80 80  Resp:  20 16 16   Temp:  98.3 F (36.8 C)    TempSrc:  Oral    SpO2:  96% 98% 98%  Weight: 93 kg     Height:        Intake/Output Summary (Last 24 hours) at 03/30/2019 1041 Last data filed at 03/30/2019 0447 Gross per 24 hour  Intake 1536.93 ml  Output 1125 ml  Net 411.93 ml   Filed Weights   03/28/19 0027 03/29/19 0215 03/30/19 0440  Weight: 94.9 kg 94.6 kg 93 kg    Examination: General exam: Appears calm and comfortable  Respiratory system: Expiratory wheezes, respiratory effort remains normal, on 3 L nasal cannula O2 this morning Cardiovascular system: S1 & S2 heard, RRR. No pedal edema. Gastrointestinal system: Abdomen is nondistended, soft and nontender. Normal bowel sounds heard. Central nervous system: Alert and oriented. Non focal exam. Speech clear  Extremities: Symmetric in appearance bilaterally  Skin: No rashes, lesions or ulcers on exposed skin  Psychiatry: Judgement and insight appear stable. Mood & affect appropriate.    Data Reviewed: I have personally reviewed following labs and imaging studies  CBC: Recent Labs  Lab 03/24/19 0255  03/24/19 0344 03/24/19 0730 03/25/19 0349 03/26/19 0416 03/27/19 0352 03/28/19 0530  WBC 13.0*   < > 12.7* 10.0 11.3* 10.4 6.8  NEUTROABS 9.9*  --   --   --   --   --   --   HGB 9.7*   < > 9.1* 8.7* 8.2* 8.6* 9.2*  HCT 33.1*   < > 31.6* 28.6* 27.3* 28.8* 31.7*  MCV 106.4*   < > 106.0* 100.4* 102.2* 104.0* 103.9*  PLT 450*   < > 420* 377 324 299 299   < > = values in this interval not displayed.   Basic Metabolic Panel: Recent Labs  Lab 03/25/19 0349 03/26/19 0416 03/27/19 0352 03/29/19 0306 03/30/19 0444  NA 141 137 138 138 139  K 3.4* 3.5 3.7 3.4* 3.7  CL 94* 92* 94* 93* 93*  CO2 36* 36* 36* 39* 37*  GLUCOSE 133* 157* 109* 100* 105*  BUN 20 24* 32* 15 9  CREATININE 0.76 0.70 0.75 0.48 0.56  CALCIUM 8.1* 7.7* 8.1* 8.3* 8.3*  MG 2.1 2.3  --  2.3 2.3  PHOS 2.2* 3.1  --   --   --     GFR: Estimated Creatinine Clearance: 53.7 mL/min (by C-G formula based on SCr of 0.56 mg/dL). Liver Function Tests: Recent Labs  Lab 03/24/19 0255  AST 57*  ALT 50*  ALKPHOS 101  BILITOT 1.1  PROT 6.0*  ALBUMIN 3.0*   No results for input(s): LIPASE, AMYLASE in the last 168 hours. No results for input(s): AMMONIA in the last 168 hours. Coagulation Profile: Recent Labs  Lab 03/24/19 0255  INR 1.3*   Cardiac Enzymes: No results for input(s): CKTOTAL, CKMB, CKMBINDEX, TROPONINI in the last 168 hours. BNP (last 3 results) No results for input(s): PROBNP in the last 8760 hours. HbA1C: No results for input(s): HGBA1C in the last 72 hours. CBG: Recent Labs  Lab 03/27/19 0304 03/27/19 0744 03/27/19 1058 03/27/19 2036 03/28/19 0049  GLUCAP 107* 88 105* 106* 97   Lipid Profile: No results for input(s): CHOL, HDL, LDLCALC, TRIG, CHOLHDL, LDLDIRECT in the last 72 hours. Thyroid Function Tests: No results for input(s): TSH, T4TOTAL, FREET4, T3FREE, THYROIDAB in the last 72 hours. Anemia Panel: No results for input(s): VITAMINB12, FOLATE, FERRITIN, TIBC, IRON, RETICCTPCT in the last 72 hours. Sepsis Labs: Recent Labs  Lab 03/24/19 0255 03/25/19 0349 03/26/19 0416  PROCALCITON <0.10 0.14 <0.10  LATICACIDVEN 1.5  --   --     Recent Results (from the past 240 hour(s))  Respiratory Panel by RT PCR (Flu A&B, Covid) - Nasopharyngeal Swab     Status: None   Collection Time: 03/24/19  2:58 AM   Specimen: Nasopharyngeal Swab  Result Value Ref Range Status   SARS Coronavirus 2 by RT PCR NEGATIVE NEGATIVE Final    Comment: (NOTE) SARS-CoV-2 target nucleic acids are NOT DETECTED. The SARS-CoV-2 RNA is generally detectable in upper respiratoy specimens during the acute phase of infection. The lowest concentration of SARS-CoV-2 viral copies this assay can detect is 131 copies/mL. A negative result does not preclude SARS-Cov-2 infection and should not be used as the sole basis  for treatment or other patient management decisions. A negative result may occur with  improper specimen collection/handling, submission of specimen other than nasopharyngeal swab, presence of viral mutation(s) within the areas targeted by this assay, and inadequate number of viral copies (<131 copies/mL). A negative result must be combined with clinical observations, patient history, and epidemiological information. The expected  result is Negative. Fact Sheet for Patients:  PinkCheek.be Fact Sheet for Healthcare Providers:  GravelBags.it This test is not yet ap proved or cleared by the [place] FDA and  has been authorized for detection and/or diagnosis of SARS-CoV-2 by FDA under an Emergency Use Authorization (EUA). This EUA will remain  in effect (meaning this test can be used) for the duration of the COVID-19 declaration under Section 564(b)(1) of the Act, 21 U.S.C. section 360bbb-3(b)(1), unless the authorization is terminated or revoked sooner.    Influenza A by PCR NEGATIVE NEGATIVE Final   Influenza B by PCR NEGATIVE NEGATIVE Final    Comment: (NOTE) The Xpert Xpress SARS-CoV-2/FLU/RSV assay is intended as an aid in  the diagnosis of influenza from Nasopharyngeal swab specimens and  should not be used as a sole basis for treatment. Nasal washings and  aspirates are unacceptable for Xpert Xpress SARS-CoV-2/FLU/RSV  testing. Fact Sheet for Patients: PinkCheek.be Fact Sheet for Healthcare Providers: GravelBags.it This test is not yet approved or cleared by the [place] FDA and  has been authorized for detection and/or diagnosis of SARS-CoV-2 by  FDA under an Emergency Use Authorization (EUA). This EUA will remain  in effect (meaning this test can be used) for the duration of the  Covid-19 declaration under Section 564(b)(1) of the Act, 21  U.S.C.  section 360bbb-3(b)(1), unless the authorization is  terminated or revoked. Performed at [facility], [place] [address] [place], [place] [address]   Blood Culture (routine x 2)     Status: None   Collection Time: 03/24/19  3:00 AM   Specimen: BLOOD LEFT HAND  Result Value Ref Range Status   Specimen Description BLOOD LEFT HAND  Final   Special Requests   Final    BOTTLES DRAWN AEROBIC AND ANAEROBIC Blood Culture results may not be optimal due to an excessive volume of blood received in culture bottles   Culture   Final    NO GROWTH 5 DAYS Performed at [facility], [place] [address] [place], [place] [address]    Report Status 03/29/2019 FINAL  Final  Blood Culture (routine x 2)     Status: Abnormal   Collection Time: 03/24/19  3:05 AM   Specimen: BLOOD  Result Value Ref Range Status   Specimen Description BLOOD LEFT ANTECUBITAL  Final   Special Requests   Final    BOTTLES DRAWN AEROBIC AND ANAEROBIC Blood Culture adequate volume   Culture  Setup Time   Final    AEROBIC BOTTLE ONLY GRAM POSITIVE COCCI CRITICAL RESULT CALLED TO, READ BACK BY AND VERIFIED WITH: KAREN AMEND  PHARM @ 0323 ON 03/25/19 BY ROBINSON Z.     Culture (A)  Final    STAPHYLOCOCCUS SPECIES (COAGULASE NEGATIVE) THE SIGNIFICANCE OF ISOLATING THIS ORGANISM FROM A SINGLE SET OF BLOOD CULTURES WHEN MULTIPLE SETS ARE DRAWN IS UNCERTAIN. PLEASE NOTIFY THE MICROBIOLOGY DEPARTMENT WITHIN ONE WEEK IF SPECIATION AND SENSITIVITIES ARE REQUIRED. Performed at [facility], [place] [address] [place], [place] [address]    Report Status 03/26/2019 FINAL  Final  Urine culture     Status: None   Collection Time: 03/24/19  3:20 AM   Specimen: In/Out Cath Urine  Result Value Ref Range Status   Specimen Description IN/OUT CATH URINE  Final   Special Requests NONE  Final   Culture   Final    NO GROWTH Performed at [facility], [place] [address] [place], [place]  [address]    Report Status 03/24/2019 FINAL   Final  MRSA PCR Screening     Status: None   Collection Time: 03/24/19  7:42 AM   Specimen: Nasal Mucosa; Nasopharyngeal  Result Value Ref Range Status   MRSA by PCR NEGATIVE NEGATIVE Final    Comment:        The GeneXpert MRSA Assay (FDA approved for NASAL specimens only), is one component of a comprehensive MRSA colonization surveillance program. It is not intended to diagnose MRSA infection nor to guide or monitor treatment for MRSA infections. Performed at [facility], [place] [address] [place], [place] [address]       Radiology Studies: DG CHEST PORT 1 VIEW  Result Date: 03/30/2019 CLINICAL DATA:  Acute respiratory failure. EXAM: PORTABLE CHEST 1 VIEW COMPARISON:  03/28/2019 FINDINGS: Right-sided PICC line unchanged with tip just below the cavoatrial junction. Lungs are adequately inflated with interval worsening of bilateral perihilar airspace opacification which may be due to interstitial edema or infection. Stable left base/retrocardiac opacification likely effusion/atelectasis. Mild stable cardiomegaly. Remainder of the exam is unchanged. IMPRESSION: 1. Stable cardiomegaly with worsening bilateral perihilar airspace opacification likely moderate interstitial edema and less likely infection. Stable left base opacification likely effusion with atelectasis. 2.  Right-sided PICC line unchanged. Electronically Signed   By: Marin Olp M.D.   On: 03/30/2019 08:07      Scheduled Meds: . aspirin  81 mg Oral Daily  . buprenorphine  1 patch Transdermal Q Fri  . chlorhexidine gluconate (MEDLINE KIT)  15 mL Mouth Rinse BID  . Chlorhexidine Gluconate Cloth  6 each Topical Q0600  . DULoxetine  60 mg Oral Daily  . heparin  5,000 Units Subcutaneous Q8H  . mouth rinse  15 mL Mouth Rinse BID  . metoprolol tartrate  12.5 mg Oral BID  . midodrine  10 mg Oral TID WC  . pantoprazole  40 mg Oral Daily  . pramipexole  1 mg Oral QHS  . predniSONE  50 mg Oral Q breakfast  .  senna-docusate  2 tablet Oral BID  . sodium chloride flush  10-40 mL Intracatheter Q12H  . umeclidinium bromide  1 puff Inhalation Daily   Continuous Infusions: . sodium chloride Stopped (03/29/19 2327)  . ceFEPime (MAXIPIME) IV 200 mL/hr at 03/30/19 0000     LOS: 6 days      Time spent: 35 minutes   Dessa Phi, DO [facility] 03/30/2019, 10:41 AM   Available via Epic secure chat 7am-7pm After these hours, please refer to coverage provider listed on [email or web address]

## 2019-03-31 ENCOUNTER — Inpatient Hospital Stay (HOSPITAL_COMMUNITY): Payer: Medicare Other | Admitting: Occupational Therapy

## 2019-03-31 ENCOUNTER — Inpatient Hospital Stay (HOSPITAL_COMMUNITY): Payer: Medicare Other

## 2019-03-31 ENCOUNTER — Encounter (HOSPITAL_COMMUNITY): Payer: Self-pay | Admitting: Physical Medicine & Rehabilitation

## 2019-03-31 ENCOUNTER — Other Ambulatory Visit: Payer: Self-pay

## 2019-03-31 ENCOUNTER — Inpatient Hospital Stay (HOSPITAL_COMMUNITY)
Admission: RE | Admit: 2019-03-31 | Discharge: 2019-04-05 | DRG: 945 | Disposition: A | Discharge status: Other Acute Inpatient Hospital | Payer: Medicare Other | Source: Intra-hospital | Attending: Physical Medicine & Rehabilitation | Admitting: Physical Medicine & Rehabilitation

## 2019-03-31 DIAGNOSIS — Z7952 Long term (current) use of systemic steroids: Secondary | ICD-10-CM | POA: Diagnosis not present

## 2019-03-31 DIAGNOSIS — Z87891 Personal history of nicotine dependence: Secondary | ICD-10-CM

## 2019-03-31 DIAGNOSIS — T380X5A Adverse effect of glucocorticoids and synthetic analogues, initial encounter: Secondary | ICD-10-CM

## 2019-03-31 DIAGNOSIS — I110 Hypertensive heart disease with heart failure: Secondary | ICD-10-CM | POA: Diagnosis present

## 2019-03-31 DIAGNOSIS — D591 Autoimmune hemolytic anemia, unspecified: Secondary | ICD-10-CM | POA: Diagnosis present

## 2019-03-31 DIAGNOSIS — I5033 Acute on chronic diastolic (congestive) heart failure: Secondary | ICD-10-CM

## 2019-03-31 DIAGNOSIS — J449 Chronic obstructive pulmonary disease, unspecified: Secondary | ICD-10-CM | POA: Diagnosis present

## 2019-03-31 DIAGNOSIS — K5901 Slow transit constipation: Secondary | ICD-10-CM

## 2019-03-31 DIAGNOSIS — Z9981 Dependence on supplemental oxygen: Secondary | ICD-10-CM

## 2019-03-31 DIAGNOSIS — G4733 Obstructive sleep apnea (adult) (pediatric): Secondary | ICD-10-CM | POA: Diagnosis present

## 2019-03-31 DIAGNOSIS — Z7982 Long term (current) use of aspirin: Secondary | ICD-10-CM

## 2019-03-31 DIAGNOSIS — R159 Full incontinence of feces: Secondary | ICD-10-CM | POA: Diagnosis not present

## 2019-03-31 DIAGNOSIS — J9611 Chronic respiratory failure with hypoxia: Secondary | ICD-10-CM | POA: Diagnosis present

## 2019-03-31 DIAGNOSIS — R627 Adult failure to thrive: Secondary | ICD-10-CM | POA: Diagnosis present

## 2019-03-31 DIAGNOSIS — R1310 Dysphagia, unspecified: Secondary | ICD-10-CM

## 2019-03-31 DIAGNOSIS — M40209 Unspecified kyphosis, site unspecified: Secondary | ICD-10-CM | POA: Diagnosis present

## 2019-03-31 DIAGNOSIS — K5903 Drug induced constipation: Secondary | ICD-10-CM | POA: Diagnosis present

## 2019-03-31 DIAGNOSIS — R0603 Acute respiratory distress: Secondary | ICD-10-CM

## 2019-03-31 DIAGNOSIS — I9589 Other hypotension: Secondary | ICD-10-CM | POA: Diagnosis present

## 2019-03-31 DIAGNOSIS — Z8572 Personal history of non-Hodgkin lymphomas: Secondary | ICD-10-CM | POA: Diagnosis not present

## 2019-03-31 DIAGNOSIS — R739 Hyperglycemia, unspecified: Secondary | ICD-10-CM

## 2019-03-31 DIAGNOSIS — G894 Chronic pain syndrome: Secondary | ICD-10-CM | POA: Diagnosis present

## 2019-03-31 DIAGNOSIS — Z6832 Body mass index (BMI) 32.0-32.9, adult: Secondary | ICD-10-CM

## 2019-03-31 DIAGNOSIS — Z515 Encounter for palliative care: Secondary | ICD-10-CM | POA: Diagnosis not present

## 2019-03-31 DIAGNOSIS — I4891 Unspecified atrial fibrillation: Secondary | ICD-10-CM

## 2019-03-31 DIAGNOSIS — I480 Paroxysmal atrial fibrillation: Secondary | ICD-10-CM | POA: Diagnosis present

## 2019-03-31 DIAGNOSIS — J9612 Chronic respiratory failure with hypercapnia: Secondary | ICD-10-CM | POA: Diagnosis present

## 2019-03-31 DIAGNOSIS — Z66 Do not resuscitate: Secondary | ICD-10-CM | POA: Diagnosis not present

## 2019-03-31 DIAGNOSIS — E46 Unspecified protein-calorie malnutrition: Secondary | ICD-10-CM

## 2019-03-31 DIAGNOSIS — W19XXXD Unspecified fall, subsequent encounter: Secondary | ICD-10-CM | POA: Diagnosis present

## 2019-03-31 DIAGNOSIS — R32 Unspecified urinary incontinence: Secondary | ICD-10-CM

## 2019-03-31 DIAGNOSIS — Z9181 History of falling: Secondary | ICD-10-CM | POA: Diagnosis not present

## 2019-03-31 DIAGNOSIS — Z7189 Other specified counseling: Secondary | ICD-10-CM

## 2019-03-31 DIAGNOSIS — R5381 Other malaise: Secondary | ICD-10-CM | POA: Diagnosis present

## 2019-03-31 DIAGNOSIS — G629 Polyneuropathy, unspecified: Secondary | ICD-10-CM | POA: Diagnosis present

## 2019-03-31 DIAGNOSIS — Z79899 Other long term (current) drug therapy: Secondary | ICD-10-CM | POA: Diagnosis not present

## 2019-03-31 DIAGNOSIS — I959 Hypotension, unspecified: Secondary | ICD-10-CM | POA: Diagnosis present

## 2019-03-31 DIAGNOSIS — I350 Nonrheumatic aortic (valve) stenosis: Secondary | ICD-10-CM | POA: Diagnosis present

## 2019-03-31 DIAGNOSIS — Z9081 Acquired absence of spleen: Secondary | ICD-10-CM | POA: Diagnosis not present

## 2019-03-31 DIAGNOSIS — R911 Solitary pulmonary nodule: Secondary | ICD-10-CM | POA: Diagnosis present

## 2019-03-31 DIAGNOSIS — S72002D Fracture of unspecified part of neck of left femur, subsequent encounter for closed fracture with routine healing: Secondary | ICD-10-CM | POA: Diagnosis not present

## 2019-03-31 DIAGNOSIS — T148XXA Other injury of unspecified body region, initial encounter: Secondary | ICD-10-CM

## 2019-03-31 LAB — CBC
HCT: 29.8 % — ABNORMAL LOW (ref 36.0–46.0)
Hemoglobin: 8.5 g/dL — ABNORMAL LOW (ref 12.0–15.0)
MCH: 30.6 pg (ref 26.0–34.0)
MCHC: 28.5 g/dL — ABNORMAL LOW (ref 30.0–36.0)
MCV: 107.2 fL — ABNORMAL HIGH (ref 80.0–100.0)
Platelets: 313 10*3/uL (ref 150–400)
RBC: 2.78 MIL/uL — ABNORMAL LOW (ref 3.87–5.11)
RDW: 15.8 % — ABNORMAL HIGH (ref 11.5–15.5)
WBC: 6.3 10*3/uL (ref 4.0–10.5)
nRBC: 2.1 % — ABNORMAL HIGH (ref 0.0–0.2)

## 2019-03-31 LAB — BASIC METABOLIC PANEL
Anion gap: 7 (ref 5–15)
BUN: 13 mg/dL (ref 8–23)
CO2: 40 mmol/L — ABNORMAL HIGH (ref 22–32)
Calcium: 8.5 mg/dL — ABNORMAL LOW (ref 8.9–10.3)
Chloride: 92 mmol/L — ABNORMAL LOW (ref 98–111)
Creatinine, Ser: 0.7 mg/dL (ref 0.44–1.00)
GFR calc Af Amer: >60 mL/min (ref >60)
GFR calc non Af Amer: >60 mL/min (ref >60)
Glucose, Bld: 120 mg/dL — ABNORMAL HIGH (ref 70–99)
Potassium: 3.9 mmol/L (ref 3.5–5.1)
Sodium: 139 mmol/L (ref 135–145)

## 2019-03-31 LAB — GLUCOSE, CAPILLARY: Glucose-Capillary: 128 mg/dL — ABNORMAL HIGH (ref 70–99)

## 2019-03-31 LAB — MAGNESIUM: Magnesium: 2.2 mg/dL (ref 1.7–2.4)

## 2019-03-31 MED ORDER — MIDODRINE HCL 5 MG PO TABS
10.0000 mg | ORAL_TABLET | Freq: Three times a day (TID) | ORAL | Status: DC
  Administered 2019-04-01 – 2019-04-05 (×15): 10 mg via ORAL
  Filled 2019-03-31 (×16): qty 2

## 2019-03-31 MED ORDER — IPRATROPIUM-ALBUTEROL 0.5-2.5 (3) MG/3ML IN SOLN: 3.0000 mL | Freq: Four times a day (QID) | RESPIRATORY_TRACT | Status: DC | PRN

## 2019-03-31 MED ORDER — DULOXETINE HCL 30 MG PO CPEP
60.0000 mg | ORAL_CAPSULE | Freq: Every day | ORAL | Status: DC
  Administered 2019-04-01 – 2019-04-05 (×5): 60 mg via ORAL
  Filled 2019-03-31 (×5): qty 2

## 2019-03-31 MED ORDER — ALUM & MAG HYDROXIDE-SIMETH 200-200-20 MG/5ML PO SUSP: 30.0000 mL | ORAL | Status: DC | PRN

## 2019-03-31 MED ORDER — GUAIFENESIN-DM 100-10 MG/5ML PO SYRP: 5.0000 mL | ORAL_SOLUTION | Freq: Four times a day (QID) | ORAL | Status: DC | PRN

## 2019-03-31 MED ORDER — SENNOSIDES-DOCUSATE SODIUM 8.6-50 MG PO TABS
2.0000 | ORAL_TABLET | Freq: Two times a day (BID) | ORAL | Status: DC
  Administered 2019-03-31 – 2019-04-05 (×10): 2 via ORAL
  Filled 2019-03-31 (×11): qty 2

## 2019-03-31 MED ORDER — PRAMIPEXOLE DIHYDROCHLORIDE 1 MG PO TABS
1.0000 mg | ORAL_TABLET | Freq: Every day | ORAL | Status: DC
  Administered 2019-03-31 – 2019-04-05 (×6): 1 mg via ORAL
  Filled 2019-03-31 (×6): qty 1

## 2019-03-31 MED ORDER — PANTOPRAZOLE SODIUM 40 MG PO TBEC
40.0000 mg | DELAYED_RELEASE_TABLET | Freq: Every day | ORAL | Status: DC
  Administered 2019-04-01 – 2019-04-05 (×5): 40 mg via ORAL
  Filled 2019-03-31 (×5): qty 1

## 2019-03-31 MED ORDER — PREDNISONE 20 MG PO TABS
40.0000 mg | ORAL_TABLET | Freq: Every day | ORAL | Status: AC
  Administered 2019-04-01: 40 mg via ORAL
  Filled 2019-03-31: qty 2

## 2019-03-31 MED ORDER — FUROSEMIDE 40 MG PO TABS
60.0000 mg | ORAL_TABLET | Freq: Every day | ORAL | Status: DC
  Administered 2019-04-01 – 2019-04-05 (×5): 60 mg via ORAL
  Filled 2019-03-31 (×5): qty 1

## 2019-03-31 MED ORDER — PREDNISONE 20 MG PO TABS
30.0000 mg | ORAL_TABLET | Freq: Every day | ORAL | Status: AC
  Administered 2019-04-02: 30 mg via ORAL
  Filled 2019-03-31: qty 1

## 2019-03-31 MED ORDER — PREDNISONE 10 MG PO TABS
5.0000 mg | ORAL_TABLET | Freq: Every day | ORAL | Status: AC
  Administered 2019-04-05: 09:00:00 5 mg via ORAL
  Filled 2019-03-31: qty 1

## 2019-03-31 MED ORDER — SODIUM CHLORIDE 0.9% FLUSH
10.0000 mL | INTRAVENOUS | Status: DC | PRN
  Administered 2019-04-01: 10 mL
  Administered 2019-04-05: 21:00:00 20 mL

## 2019-03-31 MED ORDER — FLEET ENEMA 7-19 GM/118ML RE ENEM: 1.0000 | ENEMA | Freq: Once | RECTAL | Status: DC | PRN

## 2019-03-31 MED ORDER — ALBUTEROL SULFATE (2.5 MG/3ML) 0.083% IN NEBU: 2.5000 mg | INHALATION_SOLUTION | RESPIRATORY_TRACT | Status: DC | PRN

## 2019-03-31 MED ORDER — PROCHLORPERAZINE MALEATE 5 MG PO TABS: 5.0000 mg | ORAL_TABLET | Freq: Four times a day (QID) | ORAL | Status: DC | PRN

## 2019-03-31 MED ORDER — CHLORHEXIDINE GLUCONATE 0.12% ORAL RINSE (MEDLINE KIT)
15.0000 mL | Freq: Two times a day (BID) | OROMUCOSAL | Status: DC
  Administered 2019-04-01 – 2019-04-05 (×8): 15 mL via OROMUCOSAL

## 2019-03-31 MED ORDER — ASPIRIN 81 MG PO CHEW
81.0000 mg | CHEWABLE_TABLET | Freq: Every day | ORAL | Status: DC
  Administered 2019-04-01 – 2019-04-05 (×5): 81 mg via ORAL
  Filled 2019-03-31 (×5): qty 1

## 2019-03-31 MED ORDER — UMECLIDINIUM BROMIDE 62.5 MCG/INH IN AEPB
1.0000 | INHALATION_SPRAY | Freq: Every day | RESPIRATORY_TRACT | Status: DC
  Administered 2019-04-01 – 2019-04-05 (×4): 1 via RESPIRATORY_TRACT
  Filled 2019-03-31 (×2): qty 7

## 2019-03-31 MED ORDER — METOPROLOL TARTRATE 12.5 MG HALF TABLET
12.5000 mg | ORAL_TABLET | Freq: Two times a day (BID) | ORAL | Status: DC
  Administered 2019-03-31 – 2019-04-05 (×8): 12.5 mg via ORAL
  Filled 2019-03-31 (×11): qty 1

## 2019-03-31 MED ORDER — PROCHLORPERAZINE EDISYLATE 10 MG/2ML IJ SOLN: 5.0000 mg | Freq: Four times a day (QID) | INTRAMUSCULAR | Status: DC | PRN

## 2019-03-31 MED ORDER — BISACODYL 10 MG RE SUPP: 10.0000 mg | Freq: Every day | RECTAL | Status: DC | PRN

## 2019-03-31 MED ORDER — POLYETHYLENE GLYCOL 3350 17 G PO PACK
17.0000 g | PACK | Freq: Every day | ORAL | Status: DC
  Administered 2019-04-01 – 2019-04-05 (×4): 17 g via ORAL
  Filled 2019-03-31 (×5): qty 1

## 2019-03-31 MED ORDER — SULFAMETHOXAZOLE-TRIMETHOPRIM 800-160 MG PO TABS
1.0000 | ORAL_TABLET | ORAL | Status: DC
  Filled 2019-03-31 (×2): qty 1

## 2019-03-31 MED ORDER — PROCHLORPERAZINE 25 MG RE SUPP: 12.5000 mg | Freq: Four times a day (QID) | RECTAL | Status: DC | PRN

## 2019-03-31 MED ORDER — OXYCODONE HCL 5 MG PO TABS
2.5000 mg | ORAL_TABLET | Freq: Four times a day (QID) | ORAL | Status: DC | PRN
  Administered 2019-03-31 – 2019-04-02 (×3): 2.5 mg via ORAL
  Filled 2019-03-31 (×5): qty 1

## 2019-03-31 MED ORDER — LEVALBUTEROL HCL 0.63 MG/3ML IN NEBU
0.6300 mg | INHALATION_SOLUTION | Freq: Three times a day (TID) | RESPIRATORY_TRACT | Status: DC
  Administered 2019-03-31: 22:00:00 0.63 mg via RESPIRATORY_TRACT
  Filled 2019-03-31: qty 3

## 2019-03-31 MED ORDER — DIPHENHYDRAMINE HCL 12.5 MG/5ML PO ELIX: 12.5000 mg | ORAL_SOLUTION | Freq: Four times a day (QID) | ORAL | Status: DC | PRN

## 2019-03-31 MED ORDER — PREDNISONE 10 MG PO TABS: 5.0000 mg | ORAL_TABLET | Freq: Every day | ORAL | Status: DC

## 2019-03-31 MED ORDER — PREDNISONE 20 MG PO TABS
20.0000 mg | ORAL_TABLET | Freq: Every day | ORAL | Status: AC
  Administered 2019-04-03: 09:00:00 20 mg via ORAL
  Filled 2019-03-31: qty 1

## 2019-03-31 MED ORDER — SODIUM CHLORIDE 0.9% FLUSH
10.0000 mL | Freq: Two times a day (BID) | INTRAVENOUS | Status: DC
  Administered 2019-04-01 – 2019-04-05 (×2): 10 mL

## 2019-03-31 MED ORDER — PREDNISONE 50 MG PO TABS: 50.0000 mg | ORAL_TABLET | Freq: Every day | ORAL | 0 refills | Status: AC

## 2019-03-31 MED ORDER — ACETAMINOPHEN 325 MG PO TABS
325.0000 mg | ORAL_TABLET | ORAL | Status: DC | PRN
  Administered 2019-04-03 – 2019-04-04 (×2): 650 mg via ORAL
  Filled 2019-03-31 (×2): qty 2

## 2019-03-31 MED ORDER — ENOXAPARIN SODIUM 40 MG/0.4ML ~~LOC~~ SOLN
40.0000 mg | SUBCUTANEOUS | Status: DC
  Administered 2019-04-01 – 2019-04-05 (×5): 40 mg via SUBCUTANEOUS
  Filled 2019-03-31 (×5): qty 0.4

## 2019-03-31 MED ORDER — PREDNISONE 10 MG PO TABS
10.0000 mg | ORAL_TABLET | Freq: Every day | ORAL | Status: AC
  Administered 2019-04-04: 09:00:00 10 mg via ORAL
  Filled 2019-03-31: qty 1

## 2019-03-31 NOTE — Progress Notes (Signed)
PICC removal order: Arrived to room, pt up in chair eating dinner, being transferred to CIR. Requested RN enter consult when in bed for PICC removal.

## 2019-03-31 NOTE — Progress Notes (Addendum)
PMR Admission Coordinator Pre-Admission Assessment   Patient: Virginia Thompson is an 86 y.o., female MRN: [id number] DOB: Jul 13, 1928 Height: 5' 4"  (162.6 cm) Weight: 94.8 kg   Insurance Information HMO: yes    PPO:      PCP:      IPA:      80/20:      OTHER:  PRIMARY: UHC Dual Medicare/Medicaid      Policy#: [id number]      Subscriber: pt CM Name: Cristie Hem      Phone#: [phone]     Fax#: [phone] Pre-Cert#: [id number] auth for CIR provided by Cristie Hem at [place] with update due to fax listed above on 2/7.       Employer:  Benefits:  Phone #: [phone]     Name:  Eff. Date: 02/28/19     Deduct: $0      Out of Pocket Max: $7550 [phone] met)      Life Max: n/a CIR: $1400/admission copay      SNF: 100 full days Outpatient: 100%     Co-Pay: Home Health: 100%      Co-Pay:  DME: 80%     Co-Pay: 20% Providers:   SECONDARY:       Policy#:       Subscriber:  CM Name:       Phone#:      Fax#:  Pre-Cert#:       Employer:  Benefits:  Phone #:      Name:  Eff. Date:      Deduct:       Out of Pocket Max:       Life Max:  CIR:       SNF:  Outpatient:      Co-Pay:  Home Health:       Co-Pay:  DME:      Co-Pay:    Medicaid Application Date:       Case Manager:  Disability Application Date:       Case Worker:    The "Data Collection Information Summary" for patients in Inpatient Rehabilitation Facilities with attached "Privacy Act [place] Records" was provided and verbally reviewed with: Patient and Family   Emergency Contact Information         Contact Information     Name Relation Home Work Mobile    [name],[name] Daughter     [phone]         Current Medical History  Patient Admitting Diagnosis: PNA with respiratory failure, L hip fracture   History of Present Illness: Pt is an 86 y/o female with PMH of COPD and OSA on 2L [place] at baseline (CPAP qhs), heart failure, ovarian cancer, hypertension and 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 Marcelino Scot on 1/8.  Pt was initially discharged to SNF for rehab, and while at SNF developed worsening respiratory failure and EMS was called.  On EMS on arrival, pt noted to be hypoxic with saturations in 60s.  In ED on 1/25, pt initially febrile, hypotensive and tachycardic.  Workup revealed pulmonary edema vs pneumonia.  Pt with worsening mental status, requiring intubation in ED.  Pt started on levophed for tight BP control.  Pt was extubated on 1/26 and transferred out of ICU on 1/28.  Hospital course further complicated with Afib with RVR, returned to NSR when home metoprolol started.  Over the weekend prior to admission pt with episodes of desaturations into the upper 70s, requiring increased  O2 and medication adjustments.  Therapy evaluations were completed and pt was recommended for CIR.    Patient's medical record from [facility] has been reviewed by the rehabilitation admission coordinator and physician.   Past Medical History      Past Medical History:  Diagnosis Date  . Blood transfusion without reported diagnosis    . Cancer ([place])    . CHF (congestive heart failure) ([place])    . Hypertension    . Hypertension    . Lymphoma ([place])    . Sleep apnea        Family History   family history is not on file.   Prior Rehab/Hospitalizations Has the patient had prior rehab or hospitalizations prior to admission? Yes   Has the patient had major surgery during 100 days prior to admission? Yes              Current Medications   Current Facility-Administered Medications:  .  0.9 %  sodium chloride infusion, , Intravenous, PRN, Dessa Phi, DO, Last Rate: 10 mL/hr at 03/31/19 0332, Rate Verify at 03/31/19 0332 .  acetaminophen (TYLENOL) tablet 650 mg, 650 mg, Oral, Q6H PRN, Chesley Mires, MD, 650 mg at 03/30/19 2045 .  albuterol (PROVENTIL) (2.5 MG/3ML) 0.083% nebulizer solution 2.5 mg, 2.5 mg, Nebulization, Q4H PRN, Dessa Phi, DO, 2.5 mg at  03/31/19 0809 .  aspirin chewable tablet 81 mg, 81 mg, Oral, Daily, Chesley Mires, MD, 81 mg at 03/31/19 1033 .  buprenorphine (BUTRANS) 5 MCG/HR 1 patch, 1 patch, Transdermal, Q Jonita Albee, MD, 1 patch at 03/28/19 1417 .  chlorhexidine gluconate (MEDLINE KIT) (PERIDEX) 0.12 % solution 15 mL, 15 mL, Mouth Rinse, BID, Sood, Vineet, MD, 15 mL at 03/31/19 0850 .  Chlorhexidine Gluconate Cloth 2 % PADS 6 each, 6 each, Topical, Q0600, Chesley Mires, MD, 6 each at 03/30/19 1200 .  DULoxetine (CYMBALTA) DR capsule 60 mg, 60 mg, Oral, Daily, Sood, Vineet, MD, 60 mg at 03/31/19 1033 .  furosemide (LASIX) tablet 60 mg, 60 mg, Oral, Daily, Dessa Phi, DO, 60 mg at 03/31/19 1033 .  guaiFENesin (ROBITUSSIN) 100 MG/5ML solution 100 mg, 5 mL, Oral, Q4H PRN, Dessa Phi, DO .  heparin injection 5,000 Units, 5,000 Units, Subcutaneous, Q8H, Gleason, Otilio Carpen, PA-C, 5,000 Units at 03/30/19 2046 .  ipratropium-albuterol (DUONEB) 0.5-2.5 (3) MG/3ML nebulizer solution 3 mL, 3 mL, Nebulization, Q6H PRN, Dessa Phi, DO, 3 mL at 03/30/19 0939 .  MEDLINE mouth rinse, 15 mL, Mouth Rinse, BID, Chesley Mires, MD, 15 mL at 03/30/19 2051 .  metoprolol tartrate (LOPRESSOR) tablet 12.5 mg, 12.5 mg, Oral, BID, Dessa Phi, DO, 12.5 mg at 03/31/19 1033 .  midodrine (PROAMATINE) tablet 10 mg, 10 mg, Oral, TID WC, Sood, Vineet, MD, 10 mg at 03/31/19 1200 .  oxyCODONE (Oxy IR/ROXICODONE) immediate release tablet 2.5 mg, 2.5 mg, Oral, Q6H PRN, Madalyn Rob, MD, 2.5 mg at 03/26/19 1127 .  pantoprazole (PROTONIX) EC tablet 40 mg, 40 mg, Oral, Daily, Madalyn Rob, MD, 40 mg at 03/31/19 1033 .  pramipexole (MIRAPEX) tablet 1 mg, 1 mg, Oral, QHS, Sood, Vineet, MD, 1 mg at 03/30/19 2046 .  predniSONE (DELTASONE) tablet 50 mg, 50 mg, Oral, Q breakfast, Dessa Phi, DO, 50 mg at 03/31/19 0835 .  senna-docusate (Senokot-S) tablet 2 tablet, 2 tablet, Oral, BID, Chesley Mires, MD, 2 tablet at 03/31/19 1033 .  sodium chloride  flush (NS) 0.9 % injection 10-40 mL, 10-40 mL, Intracatheter, Q12H, Chesley Mires, MD, 10  mL at 03/30/19 2051 .  sodium chloride flush (NS) 0.9 % injection 10-40 mL, 10-40 mL, Intracatheter, PRN, Halford Chessman, Vineet, MD .  umeclidinium bromide (INCRUSE ELLIPTA) 62.5 MCG/INH 1 puff, 1 puff, Inhalation, Daily, Collier Bullock, MD, 1 puff at 03/31/19 0805   Patients Current Diet:     Diet Order                      DIET DYS 3 Room service appropriate? Yes with Assist; Fluid consistency: Thin; Fluid restriction: 1500 mL Fluid  Diet effective now                   Precautions / Restrictions Precautions Precautions: Fall Restrictions Weight Bearing Restrictions: No LLE Weight Bearing: Weight bearing as tolerated    Has the patient had 2 or more falls or a fall with injury in the past year? No   Prior Activity Level Limited Community (1-2x/wk): was not driving, daughter assisting some with IADLs and self bathing getting difficult   Prior Functional Level Self Care: Did the patient need help bathing, dressing, using the toilet or eating? Needed some help   Indoor Mobility: Did the patient need assistance with walking from room to room (with or without device)? Independent   Stairs: Did the patient need assistance with internal or external stairs (with or without device)? Needed some help   Functional Cognition: Did the patient need help planning regular tasks such as shopping or remembering to take medications? Needed some help   Home Assistive Devices / [place] Devices/Equipment: CPAP, Shower chair without back, Environmental consultant (specify type), Wheelchair Home Equipment: Environmental consultant - 2 wheels, Walker - 4 wheels, Bedside commode, Wheelchair - manual, Grab bars - tub/shower   Prior Device Use: Indicate devices/aids used by the patient prior to current illness, exacerbation or injury? Walker   Current Functional Level Cognition   Overall Cognitive Status: Within Functional Limits for  tasks assessed Orientation Level: Oriented X4    Extremity Assessment (includes Sensation/Coordination)   Upper Extremity Assessment: Defer to OT evaluation  Lower Extremity Assessment: LLE deficits/detail LLE Deficits / Details: AAROM limited to about 50 degrees knee flexion, 3/5 knee extension strength, 2/5 hip flexion LLE Sensation: history of peripheral neuropathy     ADLs   Overall ADL's : Needs assistance/impaired Eating/Feeding: Set up, Independent, Sitting Grooming: Wash/dry hands, Wash/dry face, Set up, Supervision/safety, Sitting Upper Body Bathing: Min guard, Sitting Lower Body Bathing: Maximal assistance, Sitting/lateral leans, Sit to/from stand Upper Body Dressing : Min guard, Sitting Lower Body Dressing: Total assistance, +2 for physical assistance, Sit to/from stand Lower Body Dressing Details (indicate cue type and reason): to don socks, mod assist +2 sit to stand  Toilet Transfer: Maximal assistance, Moderate assistance, RW, Cueing for safety, Cueing for sequencing, Stand-pivot Toilet Transfer Details (indicate cue type and reason): simulated to recliner  Toileting- Clothing Manipulation and Hygiene: Total assistance, Sit to/from stand Toileting - Clothing Manipulation Details (indicate cue type and reason): incontinent BM, total assist for hygiene while standing Functional mobility during ADLs: Maximal assistance, Moderate assistance, Cueing for safety, Cueing for sequencing, Rolling walker General ADL Comments: limited by weakness, pain in L hip, impaired balance and decreased activity tolerance     Mobility   Overal bed mobility: Needs Assistance Bed Mobility: Supine to Sit Supine to sit: Mod assist, HOB elevated General bed mobility comments: cues for technique assisted with R leg to scoot to EOB, assist to lift trunk and for L LE  Transfers   Overall transfer level: Needs assistance Equipment used: Rolling walker (2 wheeled) Transfers: Sit to/from  Stand Sit to Stand: Max assist Stand pivot transfers: Mod assist General transfer comment: max A to power up/lift from EOB to RW. Assisted managing RW     Ambulation / Gait / Stairs / Wheelchair Mobility   Ambulation/Gait Ambulation/Gait assistance: Mod assist, +2 physical assistance Gait Distance (Feet): 3 Feet Assistive device: Rolling walker (2 wheeled) Gait Pattern/deviations: Step-to pattern, Decreased stride length, Shuffle, Trunk flexed General Gait Details: cues for technique to use walker to unweight R LE and step with L     Posture / Balance Dynamic Sitting Balance Sitting balance - Comments: min assist fading to close supervision, preference to UE support Balance Overall balance assessment: Needs assistance Sitting-balance support: Bilateral upper extremity supported, Feet supported Sitting balance-Leahy Scale: Fair Sitting balance - Comments: min assist fading to close supervision, preference to UE support Standing balance support: Bilateral upper extremity supported, During functional activity Standing balance-Leahy Scale: Poor Standing balance comment: relaint on BUE and external support     Special needs/care consideration BiPAP/CPAP yes CPM no Continuous Drip IV no Dialysis no        Days n/a Life Vest no Oxygen 3L Special Bed no Trach Size no Wound Vac (area) no      Location n/a Skin no                              Location n/a Bowel mgmt: incontinent Bladder mgmt: incontinent Diabetic mgmt: no Behavioral consideration no Chemo/radiation no    Previous Home Environment (from acute therapy documentation) Living Arrangements: Other relatives Available Help at Discharge: Family, Available 24 hours/day Type of Home: [place] Name: [place] Home Layout: One level Home Access: Ramped entrance Bathroom Shower/Tub: Chiropodist: Standard Home Care Services: Other (Comment)   Discharge Living Setting Plans for Discharge Living  Setting: Patient's home, Lives with (comment)(daughter) Type of Home at Discharge: House Discharge Home Layout: One level Discharge Home Access: [place] entrance Discharge Bathroom Shower/Tub: Tub/shower unit Discharge Bathroom Toilet: Standard Discharge Bathroom Accessibility: Yes How Accessible: Accessible via walker Does the patient have any problems obtaining your medications?: No   Social/Family/Support Systems Anticipated Caregiver: daughter, Trinda Pascal Anticipated Caregiver's Contact Information: [phone] Ability/Limitations of Caregiver: supervision/min assist Caregiver Availability: 24/7 Discharge Plan Discussed with Primary Caregiver: Yes Is Caregiver In Agreement with Plan?: Yes Does Caregiver/Family have Issues with Lodging/Transportation while Pt is in Rehab?: No   Goals/Additional Needs Patient/Family Goal for Rehab: PT/OT supervision to min assist Expected length of stay: 18-21 days Dietary Needs: dys 3/thin Pt/Family Agrees to Admission and willing to participate: Yes Program Orientation Provided & Reviewed with Pt/Caregiver Including Roles  & Responsibilities: Yes   Decrease burden of Care through IP rehab admission: n/a   Possible need for SNF placement upon discharge: Not anticipated.    Patient Condition: I have reviewed medical records from [facility], spoken with CM, and patient and daughter. I met with patient at the bedside and discussed via phone for inpatient rehabilitation assessment.  Patient will benefit from ongoing PT and OT, can actively participate in 3 hours of therapy a day 5 days of the week, and can make measurable gains during the admission.  Patient will also benefit from the coordinated team approach during an Inpatient Acute Rehabilitation admission.  The patient will receive intensive therapy as well as Rehabilitation physician, nursing,  Education officer, museum, and care management interventions.  Due to bladder management, bowel management,  safety, skin/wound care, disease management, medication administration, pain management and patient education the patient requires 24 hour a day rehabilitation nursing.  The patient is currently mod assist with mobility and basic ADLs.  Discharge setting and therapy post discharge at home is anticipated.  Patient has agreed to participate in the Acute Inpatient Rehabilitation Program and will admit today.   Preadmission Screen Completed By:  Michel Santee, PT, DPT 03/31/2019 12:02 PM ______________________________________________________________________   Discussed status with Dr. Letta Pate on 03/31/19  at 12:02 PM  and received approval for admission today.   Admission Coordinator:  Michel Santee, PT, DPT time 12:02 PM Sudie Grumbling 03/31/19     Assessment/Plan: Diagnosis: Displaced femoral shaft fracture requiring IM nail 1. Does the need for close, 24 hr/day Medical supervision in concert with the patient's rehab needs make it unreasonable for this patient to be served in a less intensive setting? Yes 2. Co-Morbidities requiring supervision/potential complications: COPD, glucocorticoid induced osteoporosis 3. Due to bladder management, bowel management, safety, skin/wound care, disease management, medication administration, pain management and patient education, does the patient require 24 hr/day rehab nursing? Yes 4. Does the patient require coordinated care of a physician, rehab nurse, PT, OT, and SLP to address physical and functional deficits in the context of the above medical diagnosis(es)? Yes Addressing deficits in the following areas: balance, endurance, locomotion, strength, transferring, bowel/bladder control, bathing, dressing, feeding, grooming, toileting and psychosocial support 5. Can the patient actively participate in an intensive therapy program of at least 3 hrs of therapy 5 days a week? Yes 6. The potential for patient to make measurable gains while on inpatient rehab is  good 7. Anticipated functional outcomes upon discharge from inpatient rehab: modified independent PT, supervision OT, n/a SLP 8. Estimated rehab length of stay to reach the above functional goals is: 18 to 21 days 9. Anticipated discharge destination: Home 10. Overall Rehab/Functional Prognosis: good     MD Signature: Charlett Blake M.D.  Medical Group FAAPM&R (Neuromuscular Med) Diplomate Am Board of Electrodiagnostic Med Fellow Am Board of Interventional Pain

## 2019-03-31 NOTE — Progress Notes (Signed)
IV team Consulted for PICC removal

## 2019-03-31 NOTE — Progress Notes (Signed)
Physical Therapy Treatment Patient Details Name: Virginia Thompson MRN: [id number] DOB: February 15, 1929 Today's Date: 03/31/2019    History of Present Illness Pt is a 86 y/o female with PMH of COPD and OSA on home CPAP, HTN, autoimmune hemolytic anemia on chronic prednisone with recent non-tramuatic L hip fracture s/p IM nailing on 03/07/19 sent to ED from rehab facility with hypoxia. ETT 1/25-1/26. CTA reveals No PE, Bl upper lobe atelectasis /or infiltrate, bl lobe consolidation, and moderate bilateral pleural effusions.    PT Comments    Patient received in bed, agrees to PT/OT session. Reports she is feeling tired today. She requires min +2 assist for supine to sit. Max assist needed to scoot to edge of bed in sitting. Patient able to stand with +2 min assist from elevated surface and ambulated 3 feet to recliner with RW and min assist +2. Patient requires cues for AD use, sequencing, safety. Patient will continue to benefit from skilled PT while here to improve strength, functional independence and safety.     Follow Up Recommendations  CIR     Equipment Recommendations  None recommended by PT    Recommendations for Other Services Rehab consult     Precautions / Restrictions Precautions Precautions: Fall Restrictions Weight Bearing Restrictions: No LLE Weight Bearing: Weight bearing as tolerated    Mobility  Bed Mobility Overal bed mobility: Needs Assistance Bed Mobility: Supine to Sit Rolling: Min assist;+2 for physical assistance         General bed mobility comments: required max assist to scoot forward in sitting to edge of bed.  Transfers Overall transfer level: Needs assistance Equipment used: Rolling walker (2 wheeled) Transfers: Sit to/from Stand Sit to Stand: Min assist;+2 physical assistance;From elevated surface            Ambulation/Gait Ambulation/Gait assistance: Min guard;+2 physical assistance Gait Distance (Feet): 3 Feet Assistive device: Rolling  walker (2 wheeled) Gait Pattern/deviations: Step-to pattern;Decreased stride length;Decreased step length - right;Decreased stance time - left;Shuffle Gait velocity: decreased   General Gait Details: cues for proper use of walker to unweight L LE to take a step wtih right LE.Used a combination of pivot steps with actual steps   Stairs             Wheelchair Mobility    Modified Rankin (Stroke Patients Only)       Balance Overall balance assessment: Needs assistance Sitting-balance support: Feet supported;Bilateral upper extremity supported Sitting balance-Leahy Scale: Fair Sitting balance - Comments: supervision   Standing balance support: Bilateral upper extremity supported;During functional activity Standing balance-Leahy Scale: Fair Standing balance comment: reliant on RW                            Cognition Arousal/Alertness: Awake/alert Behavior During Therapy: WFL for tasks assessed/performed Overall Cognitive Status: Within Functional Limits for tasks assessed                                        Exercises Total Joint Exercises Ankle Circles/Pumps: AROM;10 reps;Both Gluteal Sets: AROM;10 reps;Both Heel Slides: AAROM;10 reps;Left Hip ABduction/ADduction: AAROM;Left;10 reps    General Comments        Pertinent Vitals/Pain Pain Assessment: Faces Faces Pain Scale: Hurts a little bit Pain Location: L LE Pain Descriptors / Indicators: Aching;Sore;Discomfort Pain Intervention(s): Monitored during session;Repositioned    Home Living  Prior Function            PT Goals (current goals can now be found in the care plan section) Acute Rehab PT Goals Patient Stated Goal: to feel better PT Goal Formulation: With patient Time For Goal Achievement: 04/09/19 Potential to Achieve Goals: Good Progress towards PT goals: Progressing toward goals    Frequency    Min 3X/week      PT Plan Current  plan remains appropriate    Co-evaluation PT/OT/SLP Co-Evaluation/Treatment: Yes Reason for Co-Treatment: Necessary to address cognition/behavior during functional activity;For patient/therapist safety;To address functional/ADL transfers PT goals addressed during session: Mobility/safety with mobility;Balance;Proper use of DME        AM-PAC PT "6 Clicks" Mobility   Outcome Measure  Help needed turning from your back to your side while in a flat bed without using bedrails?: A Lot Help needed moving from lying on your back to sitting on the side of a flat bed without using bedrails?: A Lot Help needed moving to and from a bed to a chair (including a wheelchair)?: A Little Help needed standing up from a chair using your arms (e.g., wheelchair or bedside chair)?: A Little Help needed to walk in hospital room?: A Lot Help needed climbing 3-5 steps with a railing? : Total 6 Click Score: 13    End of Session Equipment Utilized During Treatment: Gait belt;Oxygen Activity Tolerance: Patient tolerated treatment well;Patient limited by fatigue;Patient limited by pain Patient left: in chair;with call bell/phone within reach;with chair alarm set Nurse Communication: Mobility status;Other (comment)(need for pure wick to be replaced) PT Visit Diagnosis: Other abnormalities of gait and mobility (R26.89);Difficulty in walking, not elsewhere classified (R26.2);Pain;Muscle weakness (generalized) (M62.81) Pain - Right/Left: Left Pain - part of body: Leg     Time: [id number] PT Time Calculation (min) (ACUTE ONLY): 23 min  Charges:  $Gait Training: 8-22 mins                     [employer], PT, GCS 03/31/19,12:22 PM

## 2019-03-31 NOTE — IPOC Note (Signed)
Individualized overall Plan of Care (IPOC) Patient Details Name: Virginia Thompson MRN: [id number] DOB: 1928/06/24  Admitting Diagnosis: [facility] Problems: Principal Problem:   Debility Active Problems:   Physical debility   Acute on chronic diastolic (congestive) heart failure (HCC)   Atrial fibrillation with RVR (HCC)   Chronic pain syndrome   Hypoalbuminemia due to protein-calorie malnutrition (HCC)   OSA (obstructive sleep apnea)   Slow transit constipation   Supplemental oxygen dependent     Functional Problem List: Nursing Bowel, Bladder, Edema, Pain, Safety, Skin Integrity  PT Balance, Behavior, Edema, Endurance, Motor, Safety  OT Balance, Endurance, Motor, Pain, Safety  SLP    TR         Basic ADL's: OT Grooming, Bathing, Dressing, Toileting     Advanced  ADL's: OT       Transfers: PT Bed Mobility, Bed to Chair, Furniture, Musician, Floor  OT Toilet, Metallurgist: PT Emergency planning/management officer, Ambulation, Stairs     Additional Impairments: OT None  SLP        TR      Anticipated Outcomes Item Anticipated Outcome  Self Feeding    Swallowing      Basic self-care  Supervision  Toileting  Supervision   Bathroom Transfers Supervision  Bowel/Bladder  Patient able to remain free from incontinence with min assist.  Transfers  supervision  Locomotion  supervision short distance gait w/ LRAD  Communication     Cognition     Pain  Patient able to maintain pain level less than 3 on a scale of 0 to 10.  Safety/Judgment  Patient able to adhere to all safety precautions with min assist.   Therapy Plan: PT Intensity: Minimum of 1-2 x/day ,45 to 90 minutes PT Frequency: 5 out of 7 days PT Duration Estimated Length of Stay: 14-18 days OT Intensity: Minimum of 1-2 x/day, 45 to 90 minutes OT Frequency: 5 out of 7 days OT Duration/Estimated Length of Stay: 14-18 days      Team Interventions: Nursing Interventions Patient/Family  Education, Bladder Management, Bowel Management, Skin Care/Wound Management, Dysphagia/Aspiration Precaution Training, Pain Management  PT interventions Ambulation/gait training, Discharge planning, Functional mobility training, Psychosocial support, Therapeutic Activities, Wheelchair propulsion/positioning, Therapeutic Exercise, Skin care/wound management, Neuromuscular re-education, Disease management/prevention, Training and development officer, DME/adaptive equipment instruction, Pain management, Splinting/orthotics, UE/LE Strength taining/ROM, UE/LE Coordination activities, Stair training, Patient/family education, Community reintegration  OT Interventions Training and development officer, Discharge planning, Disease mangement/prevention, Engineer, drilling, Functional mobility training, Pain management, Patient/family education, Psychosocial support, Self Care/advanced ADL retraining, Skin care/wound managment, Therapeutic Activities, Therapeutic Exercise, UE/LE Strength taining/ROM  SLP Interventions    TR Interventions    SW/CM Interventions     Barriers to Discharge MD  Medical stability and Weight  Nursing      PT Medical stability, Incontinence, Wound Care, Other (comments) increased O2 demands  OT Incontinence    SLP      SW       Team Discharge Planning: Destination: PT-Home ,OT- Home , SLP-  Projected Follow-up: PT-Home health PT, OT-  Home health OT, 24 hour supervision/assistance, SLP-  Projected Equipment Needs: PT-To be determined, OT- To be determined, SLP-  Equipment Details: PT- , OT-  Patient/family involved in discharge planning: PT- Patient,  OT-Patient, SLP-   MD ELOS: 16-20 days. Medical Rehab Prognosis:  Good Assessment: 86 year old female with history of diastolic CHF--2 L oxygen dependent, OSA-CPAP, chronic bronchitis, HTN, lymphoma, autoimmune hemolytic anemia, admission 1/8 for left proximal femur  fracture s/p IM nailing by Dr. Marcelino Scot and postop course  significant for hypotension, ABLA s/p 2 units PRBC as well as acute on chronic respiratory failure.  She was discharged to SNF on 1/14 but readmitted 03/24/2019 severe pain BLE, hypotension acute renal injury and shortness of breath.  She was started on sepsis protocol due to temperature elevation and placed on BiPAP due to evidence of respiratory acidosis. Patient's mental status declined with acute on chronic hypoxic hypercapnic failure requiring intubation in ED and she received IV Lasix due to elevated BNP signs of fluid overload and was started on stress dose steroids as well as IV antibiotics for aspiration pneumonia.  She required restraints due to bouts of agitation.  Chest CT negative for PE and showed moderate BLL consolidation, moderate severity patchy bilateral upper lobe atelectasis and moderate bilateral pleural effusions.  2D echo done showing EF with aortic stenosis.  She was extubated to 3L oxygen per [place] on 01/26 felt that patient respiratory failure multifactorial due to opiates in setting of multiple medical issues complicated by aspiration pneumonia.  Swallow evaluation done showing no signs or symptoms of aspiration and dysphagia 3, thins recommended due to lack of patient's dentures.  She developed A. fib with RVR on 01/29 requiring IV digoxin and low-dose metoprolol added for rate control. Patient with history of falls and family was hesitant to start anticoagulation therefore recommendations made to follow up with primary cardiology for input past discharge.  She did have worsening of respiratory status on 1/31 am with increase in oxygen needs. CXR showed interval worsening of bilateral perihilar opacities--edema v/s infection.   She was treated with IV lasix for fluid overload and placed on 1500 cc FR.  Also started on burst of steroid due to concerns of COPD exacerbation.    Therapy ongoing and patient continues to be limited by pain, anxiety/fear and weakness affecting mobility and ADLs.  Will set goals for Supervision with PT/OT.  Due to the current state of emergency, patients may not be receiving their 3-hours of Medicare-mandated therapy.  See Team Conference Notes for weekly updates to the plan of care

## 2019-03-31 NOTE — TOC Transition Note (Signed)
Transition of Care [facility]) - CM/SW Discharge Note   Patient Details  Name: Virginia Thompson MRN: [id number] Date of Birth: 1928-05-30  Transition of Care [facility]) CM/SW Contact:  Zenon Mayo, RN Phone Number: 03/31/2019, 12:14 PM   Clinical Narrative:    Patient is for dc to CIR today.    Final next level of care: IP Rehab Facility Barriers to Discharge: No Barriers Identified   Patient Goals and CMS Choice        Discharge Placement                       Discharge Plan and Services                                     Social Determinants of Health (SDOH) Interventions     Readmission Risk Interventions No flowsheet data found.

## 2019-03-31 NOTE — H&P (Signed)
Physical Medicine and Rehabilitation Admission H&P        Chief Complaint  Patient presents with  . Debility  . Recent femur fracture      HPI: Virginia Thompson is an 86 year old female with history of diastolic CHF--2 L oxygen dependent, OSA-CPAP, chronic bronchitis, HTN, lymphoma, autoimmune hemolytic anemia, admission 1/8 for left proximal femur fracture s/p IM nailing by Dr. Marcelino Scot and postop course significant for hypotension, ABLA s/p 2 units PRBC as well as acute on chronic respiratory failure.  She was discharged to SNF on 1/14 but readmitted 03/24/2019 severe pain BLE, hypotension acute renal injury and shortness of breath.  She was started on sepsis protocol due to temperature elevation and placed on BiPAP due to evidence of respiratory acidosis. Patient's mental status declined with acute on chronic hypoxic hypercapnic failure requiring intubation in ED and she received IV Lasix due to elevated BNP signs of fluid overload and was started on stress dose steroids as well as IV antibiotics for aspiration pneumonia.  She required restraints due to bouts of agitation.  Chest CT negative for PE and showed moderate BLL consolidation, moderate severity patchy bilateral upper lobe atelectasis and moderate bilateral pleural effusions.  2D echo done showing EF with aortic stenosis.     She was extubated to 3L oxygen per [place] on 01/26 felt that patient respiratory failure multifactorial due to opiates in setting of multiple medical issues complicated by aspiration pneumonia.  Swallow evaluation done showing no signs or symptoms of aspiration and dysphagia 3, thins recommended due to lack of patient's dentures.  She developed A. fib with RVR on 01/29 requiring IV digoxin and low-dose metoprolol added for rate control. Patient with history of falls and family was hesitant to start anticoagulation therefore recommendations made to follow up with primary cardiology for input past discharge.  She did have worsening  of respiratory status on 1/31 am with increase in oxygen needs. CXR showed interval worsening of bilateral perihilar opacities--edema v/s infection.   She was treated with IV lasix for fluid overload and placed on 1500 cc FR.  Also started on burst of steroid due to concerns of COPD exacerbation.    Therapy ongoing and patient continues to be limited by pain, anxiety/fear and weakness affecting mobility and ADLs. CIR recommended due to functional decline.      Review of Systems  Constitutional: Negative for chills and fever.  HENT: Negative for hearing loss and tinnitus.   Respiratory: Positive for cough, shortness of breath and wheezing.   Cardiovascular: Negative for chest pain, palpitations and leg swelling.  Gastrointestinal: Negative for abdominal pain, heartburn and nausea.       Belly feels full--can't eat/food does not taste good  Genitourinary: Positive for frequency and urgency.  Musculoskeletal: Positive for back pain, joint pain and myalgias.  Skin: Negative for rash.  Neurological: Positive for dizziness (when she first gets up in am) and sensory change (neuropathy in legs > hands).          Past Medical History:  Diagnosis Date  . Blood transfusion without reported diagnosis    . Cancer ([place])    . CHF (congestive heart failure) ([place])    . Hypertension    . Hypertension    . Lymphoma ([place])    . Sleep apnea             Past Surgical History:  Procedure Laterality Date  . ABDOMINAL HYSTERECTOMY      . APPENDECTOMY      .  BACK SURGERY      . BREAST CYST ASPIRATION Bilateral    . CHOLECYSTECTOMY      . EYE SURGERY      . INTRAMEDULLARY (IM) NAIL INTERTROCHANTERIC Left 03/07/2019    Procedure: INTRAMEDULLARY (IM) NAIL INTERTROCHANTRIC;  Surgeon: Altamese [place], MD;  Location: [facility];  Service: Orthopedics;  Laterality: Left;  . JOINT REPLACEMENT Left    . SPLENECTOMY      . TONSILLECTOMY               Family History  Problem Relation Age of Onset  . 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 Daughter lives with patient. She reports that she quit smoking about 15 years ago. Her smoking use included cigarettes. She has never used smokeless tobacco. She reports that she does not drink alcohol or use drugs.          Allergies  Allergen Reactions  . Aleve [Naproxen] Rash  . Mobic [Meloxicam] Rash  . Tape Rash      Medications Prior to Admission  Medication Sig Dispense Refill  . acetaminophen (TYLENOL) 500 MG tablet Take 500 mg by mouth every 6 (six) hours as needed for mild pain.       Marland Kitchen albuterol (PROAIR HFA) 108 (90 Base) MCG/ACT inhaler Inhale 2 puffs into the lungs every 6 (six) hours as needed for wheezing or shortness of breath.       Marland Kitchen aspirin EC 81 MG tablet Take 81 mg by mouth daily.       . buprenorphine (BUTRANS) 5 MCG/HR PTWK Place 1 patch onto the skin every Tuesday.       . docusate sodium (COLACE) 100 MG capsule Take 100 mg by mouth daily.       . DULoxetine (CYMBALTA) 60 MG capsule Take 60 mg by mouth daily.       Marland Kitchen enoxaparin (LOVENOX) 40 MG/0.4ML injection Inject 0.4 mLs (40 mg total) into the skin daily.      Marland Kitchen esomeprazole (NEXIUM) 40 MG capsule Take 40 mg by mouth daily.       . folic acid (FOLVITE) 1 MG tablet Take 1 mg by mouth 3 (three) times daily.       . furosemide (LASIX) 40 MG tablet Take 60 mg by mouth daily.       Marland Kitchen loratadine (CLARITIN) 10 MG tablet Take 10 mg by mouth daily.      . metoprolol tartrate (LOPRESSOR) 25 MG tablet Take 12.5 mg by mouth 2 (two) times daily.       . midodrine (PROAMATINE) 10 MG tablet Take 1 tablet (10 mg total) by mouth 3 (three) times daily with meals. 30 tablet 0  . Multiple Vitamins-Minerals (ICAPS AREDS 2 PO) Take 2 tablets by mouth daily.      Marland Kitchen oxyCODONE (OXY IR/ROXICODONE) 5 MG immediate release tablet Take 1 tablet (5 mg total) by mouth every 6 (six) hours as needed for severe pain. 30 tablet 0  . OXYGEN Inhale 2 L  into the lungs.      . pramipexole (MIRAPEX) 1 MG tablet Take 1 mg by mouth at bedtime.       . predniSONE (DELTASONE) 5 MG tablet Take 5 mg by mouth daily.       Marland Kitchen senna-docusate (SENOKOT-S) 8.6-50 MG tablet Take 2 tablets by mouth 2 (two) times daily.      Marland Kitchen  sulfamethoxazole-trimethoprim (BACTRIM DS,SEPTRA DS) 800-160 MG tablet Take 1 tablet by mouth every Monday, Wednesday, and Friday.       . tiotropium (SPIRIVA HANDIHALER) 18 MCG inhalation capsule Place 18 mcg into inhaler and inhale daily.           Drug Regimen Review  Drug regimen was reviewed and remains appropriate with no significant issues identified   Home: Home Living Family/patient expects to be discharged to:: Inpatient rehab Living Arrangements: Other relatives Available Help at Discharge: Family, Available 24 hours/day Type of Home: House Home Access: Ramped entrance Home Layout: One level Bathroom Shower/Tub: Chiropodist: Standard Home Equipment: Environmental consultant - 2 wheels, Environmental consultant - 4 wheels, Bedside commode, Wheelchair - manual, Grab bars - tub/shower   Functional History: Prior Function Level of Independence: Needs assistance Gait / Transfers Assistance Needed: uses RW for mobility at baseline, increased assistance at SNF since L femur IM nail ADL's / Homemaking Assistance Needed: assist from dtr with IADL, bathing getting more difficult, increased assist at SNF since IM nail L femur   Functional Status:  Mobility: Bed Mobility Overal bed mobility: Needs Assistance Bed Mobility: Supine to Sit Supine to sit: Mod assist, HOB elevated General bed mobility comments: cues for technique assisted with R leg to scoot to EOB, assist to lift trunk and for L LE Transfers Overall transfer level: Needs assistance Equipment used: Rolling walker (2 wheeled) Transfers: Sit to/from Stand Sit to Stand: Max assist Stand pivot transfers: Mod assist General transfer comment: max A to power up/lift from EOB to RW.  Assisted managing RW Ambulation/Gait Ambulation/Gait assistance: Mod assist, +2 physical assistance Gait Distance (Feet): 3 Feet Assistive device: Rolling walker (2 wheeled) Gait Pattern/deviations: Step-to pattern, Decreased stride length, Shuffle, Trunk flexed General Gait Details: cues for technique to use walker to unweight R LE and step with L   ADL: ADL Overall ADL's : Needs assistance/impaired Eating/Feeding: Set up, Independent, Sitting Grooming: Wash/dry hands, Wash/dry face, Set up, Supervision/safety, Sitting Upper Body Bathing: Min guard, Sitting Lower Body Bathing: Maximal assistance, Sitting/lateral leans, Sit to/from stand Upper Body Dressing : Min guard, Sitting Lower Body Dressing: Total assistance, +2 for physical assistance, Sit to/from stand Lower Body Dressing Details (indicate cue type and reason): to don socks, mod assist +2 sit to stand  Toilet Transfer: Maximal assistance, Moderate assistance, RW, Cueing for safety, Cueing for sequencing, Stand-pivot Toilet Transfer Details (indicate cue type and reason): simulated to recliner  Toileting- Clothing Manipulation and Hygiene: Total assistance, Sit to/from stand Toileting - Clothing Manipulation Details (indicate cue type and reason): incontinent BM, total assist for hygiene while standing Functional mobility during ADLs: Maximal assistance, Moderate assistance, Cueing for safety, Cueing for sequencing, Rolling walker General ADL Comments: limited by weakness, pain in L hip, impaired balance and decreased activity tolerance   Cognition: Cognition Overall Cognitive Status: Within Functional Limits for tasks assessed Orientation Level: Oriented X4 Cognition Arousal/Alertness: Awake/alert Behavior During Therapy: WFL for tasks assessed/performed Overall Cognitive Status: Within Functional Limits for tasks assessed     Blood pressure (!) 116/53, pulse 86, temperature (!) 97.1 F (36.2 C), resp. rate 18, height 5'  4" (1.626 m), weight 94.8 kg, SpO2 99 %. Physical Exam  Nursing note and vitals reviewed. Constitutional: She is oriented to person, place, and time. She appears well-developed and well-nourished.  Sitting up in chair --reporting pain LLE  Respiratory: Effort normal. No respiratory distress. She has wheezes in the right upper field.  Musculoskeletal:     Comments:  RUE with edema and resolving ecchymosis/erythema? PICC in place. LLE with sutures in place.   Neurological: She is alert and oriented to person, place, and time.  Skin: Skin is warm and dry.     General: No acute distress Mood and affect are appropriate Heart: Regular rate and rhythm no rubs murmurs or extra sounds Lungs: Clear to auscultation, decreased breath sounds at bases breathing unlabored, occasional wheezes Abdomen: Positive bowel sounds, soft nontender to palpation, nondistended Extremities: No clubbing, cyanosis, or edema Skin: No evidence of breakdown, no evidence of rash, ecchymosis on abdomen from heparin shot, left hip wound clean dry and intact Neurologic: Cranial nerves II through XII intact, motor strength is 5/5 in bilateral deltoid, bicep, tricep, grip, 3 - bilateral hip flexor, knee extensors, ankle dorsiflexor and plantar flexor Sensory exam normal sensation to light touch and proprioception in bilateral upper and lower extremities  Musculoskeletal: Reduced range of motion left hip. No joint swelling    Lab Results Last 48 Hours        Results for orders placed or performed during the hospital encounter of 03/24/19 (from the past 48 hour(s))  Basic metabolic panel     Status: Abnormal    Collection Time: 03/30/19  4:44 AM  Result Value Ref Range    Sodium 139 135 - 145 mmol/L    Potassium 3.7 3.5 - 5.1 mmol/L    Chloride 93 (L) 98 - 111 mmol/L    CO2 37 (H) 22 - 32 mmol/L    Glucose, Bld 105 (H) 70 - 99 mg/dL    BUN 9 8 - 23 mg/dL    Creatinine, Ser 0.56 0.44 - 1.00 mg/dL    Calcium 8.3 (L) 8.9 -  10.3 mg/dL    GFR calc non Af Amer >60 >60 mL/min    GFR calc Af Amer >60 >60 mL/min    Anion gap 9 5 - 15      Comment: Performed at [facility], [place] [address] [place], [place] [address]  Magnesium     Status: None    Collection Time: 03/30/19  4:44 AM  Result Value Ref Range    Magnesium 2.3 1.7 - 2.4 mg/dL      Comment: Performed at [facility] [address] [place], [place] [address]  CBC     Status: Abnormal    Collection Time: 03/31/19  5:00 AM  Result Value Ref Range    WBC 6.3 4.0 - 10.5 K/uL    RBC 2.78 (L) 3.87 - 5.11 MIL/uL    Hemoglobin 8.5 (L) 12.0 - 15.0 g/dL    HCT 29.8 (L) 36.0 - 46.0 %    MCV 107.2 (H) 80.0 - 100.0 fL    MCH 30.6 26.0 - 34.0 pg    MCHC 28.5 (L) 30.0 - 36.0 g/dL    RDW 15.8 (H) 11.5 - 15.5 %    Platelets 313 150 - 400 K/uL    nRBC 2.1 (H) 0.0 - 0.2 %      Comment: Performed at [facility] [address], [place], [place] [id number]  Basic metabolic panel     Status: Abnormal    Collection Time: 03/31/19  5:00 AM  Result Value Ref Range    Sodium 139 135 - 145 mmol/L    Potassium 3.9 3.5 - 5.1 mmol/L    Chloride 92 (L) 98 - 111 mmol/L    CO2 40 (H) 22 - 32 mmol/L  Glucose, Bld 120 (H) 70 - 99 mg/dL    BUN 13 8 - 23 mg/dL    Creatinine, Ser 0.70 0.44 - 1.00 mg/dL    Calcium 8.5 (L) 8.9 - 10.3 mg/dL    GFR calc non Af Amer >60 >60 mL/min    GFR calc Af Amer >60 >60 mL/min    Anion gap 7 5 - 15      Comment: Performed at [facility] [address], [place],  [address]       Imaging Results (Last 48 hours)  DG CHEST PORT 1 VIEW   Result Date: 03/30/2019 CLINICAL DATA:  Acute respiratory failure. EXAM: PORTABLE CHEST 1 VIEW COMPARISON:  03/28/2019 FINDINGS: Right-sided PICC line unchanged with tip just below the cavoatrial junction. Lungs are adequately inflated with interval worsening of bilateral perihilar airspace opacification which may be due to interstitial edema or infection. Stable left  base/retrocardiac opacification likely effusion/atelectasis. Mild stable cardiomegaly. Remainder of the exam is unchanged. IMPRESSION: 1. Stable cardiomegaly with worsening bilateral perihilar airspace opacification likely moderate interstitial edema and less likely infection. Stable left base opacification likely effusion with atelectasis. 2.  Right-sided PICC line unchanged. Electronically Signed   By: Marin Olp M.D.   On: 03/30/2019 08:07             Medical Problem List and Plan: 1.  Debility secondary to HCAP             -patient may  shower             -ELOS/Goals: 14-17d 2.  Antithrombotics: -DVT/anticoagulation:  Pharmaceutical: Lovenox             -antiplatelet therapy: N/A 3. Neuropathy/chornic pain/Pain Management: On home dose butrans patch daily. On cymbalta. Continue low dose oxycodone prn--monitor for sedation 4. Mood: LCSW to follow for evaluation and support.              -antipsychotic agents: N/A 5. Neuropsych: This patient is capable of making decisions on her own behalf. 6. Skin/Wound Care: Monitor incision daily for healing--remove sutures.   7. Fluids/Electrolytes/Nutrition: Intake poor--offer supplement during the day. Advised patient to have family bring in food from home to help with intake.  8. Aspiration PNA: Completed 7 day course of cefepime yesterday. Transition to bactrim prophylaxis 9. PAFwith RVR: Monitor HR tid--continue metoprolol and ASA. Recheck EKG in am. Monitor for symptoms with increase in activity.  10. Left femur fracture s/p IM nailing: WBAT- lovenox for 30 days.  End date 04/07/2019 11. History of Chronic Hypotension: Continue midodrine tid.  12. Autoimmune hemolytic anemia: On chronic steroids and bactrim for prophylaxis 13. OSA: Needs to use CPAP at nights and whenever napping.  14. Obstructive lung disease/Chronic bronchitis: Continue prednisone, home doses of 5 mg of prednisone per day--will schedule taper. Respiratory status stable on  Dulera bid.  Will need to schedule nebulizers 15. Acute on Chronic diastolic CHF: Monitor for signs of overload and check daily weights. Now on 1500 cc FR.  Continue low dose metoprolol and lasix 60 mg daily (was on 40 mg bid PTA?). Heart healthy diet.  16. Left apical lung nodule: Follow up CT in 12 months recommended.  17. Constipation: Will augment bowel regimen.         Bary Leriche, PA-C 03/31/2019 "I have personally performed a face to face diagnostic evaluation of this patient.  Additionally, I have reviewed and concur with the physician assistant's documentation above." Charlett Blake M.D. [facility] FAAPM&R (Neuromuscular Med) Diplomate Am Board of Electrodiagnostic Med Fellow Am Board of Interventional Pain

## 2019-03-31 NOTE — Progress Notes (Signed)
Patient arrived to unit, no s/s of distress. Oxygen on @ 2L via nasal cannula. VS wnl. No complications noted at this time. Daughter at bedside. Audie Clear, LPN

## 2019-03-31 NOTE — Plan of Care (Signed)

## 2019-03-31 NOTE — Care Management Important Message (Signed)
Important Message  Patient Details  Name: Virginia Thompson MRN: [id number] Date of Birth: 05-28-1928   Medicare Important Message Given:  Yes     Shelda Altes 03/31/2019, 2:01 PM

## 2019-03-31 NOTE — Progress Notes (Signed)
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 ETT 1/25-1/26. CTA reveals No PE, Bl upper lobe atelectasis /or infiltrate, bl lobe consolidation, and moderate bilateral pleural effusions.   OT comments  Pt making steady progress towards OT goals this session. Pt reports feeling tired upon OT/PT arrival but agreeable to session. Overall, pt requires MIN A+2 for bed mobility and functional transfers with RW. Pt with slow pivot steps from EOB>recliner needing increased time noted increased effort with functional transfers. Hopefully pt to DC to CIR later today, but will follow acutely per POC.    Follow Up Recommendations  CIR;Supervision/Assistance - 24 hour    Equipment Recommendations  3 in 1 bedside commode    Recommendations for Other Services      Precautions / Restrictions Precautions Precautions: Fall Restrictions Weight Bearing Restrictions: No LLE Weight Bearing: Weight bearing as tolerated       Mobility Bed Mobility Overal bed mobility: Needs Assistance Bed Mobility: Rolling;Sidelying to Sit Rolling: Min assist;+2 for physical assistance Sidelying to sit: Min assist;+2 for safety/equipment;HOB elevated       General bed mobility comments: pt required MINA +2 to roll to side and elevate trunk however, MAX A to scoot hips to EOB  Transfers Overall transfer level: Needs assistance Equipment used: Rolling walker (2 wheeled) Transfers: Sit to/from [facility] Sit to Stand: Min assist;+2 physical assistance;From elevated surface Stand pivot transfers: Min assist       General transfer comment: MIN A +2 to power into  standing, pt with slow pivot heel/toe steps to recliner needing increased time and MIN A for safety and RW mgmt    Balance Overall balance assessment: Needs assistance Sitting-balance support: Feet supported;Bilateral upper extremity supported Sitting balance-Leahy Scale: Fair Sitting balance - Comments: supervision   Standing balance support: Bilateral upper extremity supported;During functional activity Standing balance-Leahy Scale: Poor Standing balance comment: reliant on RW                           ADL either performed or assessed with clinical judgement   ADL Overall ADL's : Needs assistance/impaired                     Lower Body Dressing: Total assistance;+2 for physical assistance;Sit to/from stand Lower Body Dressing Details (indicate cue type and reason): to don socks, min G- min  assist +2 sit to stand Toilet Transfer: Minimal assistance;+2 for physical assistance;+2 for safety/equipment;RW;Stand-pivot Toilet Transfer Details (indicate cue type and reason): simulated to recliner with MIN A +2 with RW, pt with slow heel/ toe pivotal steps to recliner         Functional mobility during ADLs: Minimal assistance;+2 for safety/equipment;Rolling walker General ADL Comments: session focus on functional transfer training as precursor to higher level ADLs     Vision       Perception     Praxis      Cognition Arousal/Alertness: Awake/alert Behavior During Therapy: WFL for tasks assessed/performed Overall Cognitive Status: Within Functional Limits for tasks assessed  Exercises Total Joint Exercises Ankle Circles/Pumps: AROM;10 reps;Both Gluteal Sets: AROM;10 reps;Both Heel Slides: AAROM;10 reps;Left Hip ABduction/ADduction: AAROM;Left;10 reps   Shoulder Instructions       General Comments pt on 2L at rest upon arrival with O2 dropping slightly to 89%. bumped up pt to 3L during mobility  with O2 99% and HR 92 bpm post transfer. able to wean pt down to 2L at end of session    Pertinent Vitals/ Pain       Pain Assessment: Faces Faces Pain Scale: Hurts a little bit Pain Location: L LE Pain Descriptors / Indicators: Aching;Sore;Discomfort Pain Intervention(s): Monitored during session;Repositioned  Home Living                                          Prior Functioning/Environment              Frequency           Progress Toward Goals  OT Goals(current goals can now be found in the care plan section)  Progress towards OT goals: Progressing toward goals  Acute Rehab OT Goals Patient Stated Goal: to feel better OT Goal Formulation: With patient Time For Goal Achievement: 04/09/19 Potential to Achieve Goals: Good  Plan Discharge plan remains appropriate    Co-evaluation      Reason for Co-Treatment: Necessary to address cognition/behavior during functional activity;For patient/therapist safety;To address functional/ADL transfers PT goals addressed during session: Mobility/safety with mobility;Balance;Proper use of DME        AM-PAC OT "6 Clicks" Daily Activity     Outcome Measure   Help from another person eating meals?: None Help from another person taking care of personal grooming?: A Little Help from another person toileting, which includes using toliet, bedpan, or urinal?: A Lot Help from another person bathing (including washing, rinsing, drying)?: A Lot Help from another person to put on and taking off regular upper body clothing?: A Little Help from another person to put on and taking off regular lower body clothing?: A Lot 6 Click Score: 16    End of Session Equipment Utilized During Treatment: Gait belt;Rolling walker;Oxygen;Other (comment)(2L- 3L O2)  OT Visit Diagnosis: Unsteadiness on feet (R26.81);Other abnormalities of gait and mobility (R26.89);Muscle weakness (generalized) (M62.81);Pain Pain - Right/Left:  Left Pain - part of body: Hip;Leg   Activity Tolerance Patient tolerated treatment well   Patient Left in chair;with call bell/phone within reach;with chair alarm set   Nurse Communication          Time: [id number] OT Time Calculation (min): 23 min  Charges: OT General Charges $OT Visit: 1 Visit OT Treatments $Therapeutic Activity: 8-22 mins  Lanier Clam., COTA/L Acute Rehabilitation Services [phone] [place] 03/31/2019, 12:49 PM

## 2019-03-31 NOTE — Discharge Summary (Signed)
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 Per EMS report, CPAP machine was not working and oxygen saturations were 69%; patient arrived on 15 L nonrebreather.  Patient was initially febrile, hypotensive and mildly tachycardic and sepsis protocol initiated and patient was given approximately 750 cc IV fluid. She was given cefepime and flagyl. Work-up revealed pulmonary edema versus atypical infiltrates on chest x-ray, negative Covid-19 and influenza, bland UA. ABG showed respiratory acidosis with pH of 7.2 and PCO2 of 84, BiPAP initiated for approximately 45 minutes however patient's mental status was worsening and she was intubated in the ED. Patient was admitted to critical care service, extubated on 1/26 and transferred to telemetry unit on 1/28.  She was treated with IV lasix and steroids for CHF exacerbation as well as COPD exacerbation. On day of 2/1, her respiratory status remained stable and at baseline. She was discharged in stable condition to inpatient rehab.   Discharge Diagnoses:  Principal Problem:   Acute on chronic respiratory failure with hypoxia and hypercapnia (HCC) Active Problems:   Acute on chronic diastolic CHF (congestive heart failure) (HCC)   Autoimmune hemolytic anemia   Obstructive sleep apnea on CPAP   Hypotension   Aspiration pneumonia (HCC)   Acute on chronic hypoxemic and hypercapnic respiratory failure -Thought to be secondary  to opiate medication use after hip surgery in setting of OSA, COPD, CHF exacerbation, aspiration pneumonia -Completed 7 day course of cefepime  -Patient has been extubated 1/26 -On 2 L nasal cannula O2 at baseline, CPAP nightly -On 2 L [place] this morning   COPD exacerbation -Increased prednisone dose to 50mg  daily x 5 days (end date 2/3), breathing tx, antibiotics completed as above   Acute on chronic diastolic heart failure -Echocardiogram 03/24/2019 revealed EF 60 to [id number], grade 1 diastolic dysfunction -Received IV Lasix while in ICU -BNP 2540 --> 1416  -CXR obtained 1/31, reviewed independently showing some interstitial edema and perihilar congestion. Given 1 time IV lasix with improvement. Diuresed 2750 UOP last 24 hours -Resume home lasix dose  Paroxysmal A Fib with RVR -No previous documentation or diagnosis of A Fib. She was given digoxin with return to NSR this morning. Her home metoprolol has been held during hospitalization, which I will resume. CHADSVASC 5. She is on baby aspirin at baseline. Discussed in detail with daughter regarding benefit of starting anticoagulation vs risk of bleed. Patient has fallen in the past, but not so much recently. She does have a PCP and cardiologist at [facility], although has not seen her cardiologist in a while. Daughter was hesitant on starting anticoagulation today. I've recommended patient to have close follow up with her physicians who are familiar with patient and discuss further with them regarding the benefit and risk of anticoagulation in this 86 yo patient.  -Continue metoprolol, aspirin  -NSR this morning   Chronic hypotension -Continue midodrine -Baseline BP around 100/50 per daughter   History of autoimmune hemolytic anemia -Continue prednisone (home dose 5mg  daily) once completed high-dose steroid burst for COPD exacerbation  Recent nontraumatic left hip fracture status post IM nailing -Judicious use of narcotics.  Resume home  buprenorphine patch  Gram-positive cocci 1 out of 4 blood culture -Coag negative staph.  This is thought to be a contaminant  Incidental finding of left apical lung nodule -CTA chest 03/24/2019 revealed 4 mm noncalcified left apical lung nodule. This represents a new finding when compared to the prior study dated August 01, 2006. Correlation with 12 month follow-up chest CT is recommended to determine stability.  Depression -Continue Cymbalta  Elevated d-dimer -Patient has had negative CTA chest for PE at time of admission. My suspicion for PE is low. She has multiple other medical issues as above which could cause her respiratory symptoms as well as non-VTE related elevation in [place].     Discharge Instructions  Discharge Instructions    Diet - low sodium heart healthy   Complete by: As directed    Increase activity slowly   Complete by: As directed      Allergies as of 03/31/2019      Reactions   Aleve [naproxen] Rash   Mobic [meloxicam] Rash   Tape Rash      Medication List    TAKE these medications   acetaminophen 500 MG tablet Commonly known as: TYLENOL Take 500 mg by mouth every 6 (six) hours as needed for mild pain.   aspirin EC 81 MG tablet Take 81 mg by mouth daily.   Butrans 5 MCG/HR Ptwk Generic drug: buprenorphine Place 1 patch onto the skin every Tuesday.   docusate sodium 100 MG capsule Commonly known as: COLACE Take 100 mg by mouth daily.   DULoxetine 60 MG capsule Commonly known as: CYMBALTA Take 60 mg by mouth daily.   enoxaparin 40 MG/0.4ML injection Commonly known as: LOVENOX Inject 0.4 mLs (40 mg total) into the skin daily.   esomeprazole 40 MG capsule Commonly known as: NEXIUM Take 40 mg by mouth daily.   folic acid 1 MG tablet Commonly known as: FOLVITE Take 1 mg by mouth 3 (three) times daily.   furosemide 40 MG tablet Commonly known as: LASIX Take 60 mg by mouth daily.   ICAPS AREDS 2 PO Take 2 tablets by mouth daily.    loratadine 10 MG tablet Commonly known as: CLARITIN Take 10 mg by mouth daily.   metoprolol tartrate 25 MG tablet Commonly known as: LOPRESSOR Take 12.5 mg by mouth 2 (two) times daily.   midodrine 10 MG tablet Commonly known as: PROAMATINE Take 1 tablet (10 mg total) by mouth 3 (three) times daily with meals.   oxyCODONE 5 MG immediate release tablet Commonly known as: Oxy IR/ROXICODONE Take 1 tablet (5 mg total) by mouth every 6 (six) hours as needed for severe pain.   OXYGEN Inhale 2 L into the lungs.   pramipexole 1 MG tablet Commonly known as: MIRAPEX Take 1 mg by mouth at bedtime.   predniSONE 5 MG tablet Commonly known as: DELTASONE Take 5 mg by mouth daily. What changed: Another medication with the same name was added. Make sure you understand how and when to take each.   predniSONE 50 MG tablet Commonly known as: DELTASONE Take 1 tablet (50 mg total) by mouth daily with breakfast for 2 days. What changed: You were already taking a medication with the same name, and this prescription was added. Make sure you understand how and when to take each.   ProAir HFA 108 (90 Base) MCG/ACT inhaler Generic drug: albuterol Inhale 2  puffs into the lungs every 6 (six) hours as needed for wheezing or shortness of breath.   senna-docusate 8.6-50 MG tablet Commonly known as: Senokot-S Take 2 tablets by mouth 2 (two) times daily.   Spiriva HandiHaler 18 MCG inhalation capsule Generic drug: tiotropium Place 18 mcg into inhaler and inhale daily.   sulfamethoxazole-trimethoprim 800-160 MG tablet Commonly known as: BACTRIM DS Take 1 tablet by mouth every Monday, Wednesday, and Friday.      Follow-up Information    Angeline Slim, MD. Schedule an appointment as soon as possible for a visit in 1 week(s).   Specialty: Internal Medicine Contact information: [address] [place] [address] [phone]          Allergies  Allergen Reactions  . Aleve  [Naproxen] Rash  . Mobic [Meloxicam] Rash  . Tape Rash      Procedures/Studies: CT Head Wo Contrast  Result Date: 03/24/2019 CLINICAL DATA:  Encephalopathy EXAM: CT HEAD WITHOUT CONTRAST TECHNIQUE: Contiguous axial images were obtained from the base of the skull through the vertex without intravenous contrast. COMPARISON:  04/08/2016 FINDINGS: Brain: No evidence of acute infarction, hemorrhage, hydrocephalus, extra-axial collection or mass lesion/mass effect. Atrophy and chronic small vessel ischemia that is typical of age Vascular: No hyperdense vessel or unexpected calcification. Skull: Normal. Negative for fracture or focal lesion. Sinuses/Orbits: Chronic sinusitis with endoscopic sinus surgery. Nasopharyngeal fluid in the setting of intubation. Bilateral cataract resection IMPRESSION: Aging brain.  No acute finding. Electronically Signed   By: Monte Fantasia M.D.   On: 03/24/2019 06:09   CT Angio Chest PE W and/or Wo Contrast  Result Date: 03/24/2019 CLINICAL DATA:  Respiratory distress. EXAM: CT ANGIOGRAPHY CHEST WITH CONTRAST TECHNIQUE: Multidetector CT imaging of the chest was performed using the standard protocol during bolus administration of intravenous contrast. Multiplanar CT image reconstructions and MIPs were obtained to evaluate the vascular anatomy. CONTRAST:  109mL OMNIPAQUE IOHEXOL 350 MG/ML SOLN COMPARISON:  August 01, 2006 FINDINGS: Cardiovascular: There is marked severity calcification of the thoracic aorta. Satisfactory opacification of the pulmonary arteries to the segmental level. No evidence of pulmonary embolism. A small area of volume averaging is suspected within a proximal branch of the right pulmonary artery along the right hilum (axial CT image 52, CT series number 5/sagittal CT image 69, CT series number 9). Normal heart size. No pericardial effusion. Marked severity coronary artery calcification is noted. Mediastinum/Nodes: No enlarged mediastinal, hilar, or axillary lymph  nodes. Lungs/Pleura: Endotracheal and nasogastric tubes are seen. A 4 mm noncalcified lung nodule is seen within the left apex (axial CT image 11, CT series number 6). This represents a new finding when compared to the prior study. Moderate severity patchy areas of atelectasis and/or infiltrate are seen involving the bilateral upper lobes. Moderate severity areas of consolidation is seen within the bilateral lower lobes. Moderate size bilateral pleural effusions are seen. There is no evidence of a pneumothorax. Upper Abdomen: No acute abnormality. Musculoskeletal: Multilevel degenerative changes seen throughout the thoracic spine. Review of the MIP images confirms the above findings. IMPRESSION: 1. No evidence of pulmonary embolism. 2. Moderate severity patchy bilateral upper lobe atelectasis and/or infiltrate. 3. Moderate severity bilateral lower lobe consolidation. 4. Moderate-sized bilateral pleural effusions. 5. 4 mm noncalcified left apical lung nodule. This represents a new finding when compared to the prior study dated August 01, 2006. Correlation with 12 month follow-up chest CT is recommended to determine stability. Electronically Signed   By: Virgina Norfolk M.D.   On:  03/24/2019 17:36   DG Pelvis Portable  Result Date: 03/07/2019 CLINICAL DATA:  Status post left femur surgery EXAM: PORTABLE PELVIS 1-2 VIEWS COMPARISON:  None. FINDINGS: Pelvic ring is intact. Postsurgical changes are noted in the left femur. Previously seen femoral shaft fracture is again noted. Vascular calcifications are seen. IMPRESSION: Status post ORIF of left femoral fracture. Electronically Signed   By: Inez Catalina M.D.   On: 03/07/2019 20:03   CT L-SPINE NO CHARGE  Result Date: 03/06/2019 CLINICAL DATA:  Unable to walk.  Low back pain. EXAM: CT LUMBAR SPINE WITHOUT CONTRAST TECHNIQUE: Multidetector CT imaging of the lumbar spine was performed without intravenous contrast administration. Multiplanar CT image reconstructions  were also generated. COMPARISON:  None. FINDINGS: Segmentation: 5 lumbar type vertebral bodies. Alignment: Mild curvature convex to the left with the apex at L2-3. Vertebrae: Old minor superior endplate compression deformity on the left at L4. Old minor superior endplate compression deformity more on the right at L2. No evidence of recent fracture. No focal bone lesion. Paraspinal and other soft tissues: Nonobstructing stone in the lower pole the right kidney. Aortic atherosclerosis. Disc levels: T12-L1: Minimal disc bulge.  No stenosis. L1-2: Disc degeneration with loss of disc height. Endplate osteophytes and mild bulging of the disc. No compressive stenosis. L2-3: Minimal disc bulge. Minimal facet hypertrophy. No compressive stenosis. L3-4: Mild disc bulge. Mild facet hypertrophy. Mild narrowing of the lateral recesses but no definite compressive stenosis. L4-5: Moderate disc bulge more prominent towards the left. Facet and ligamentous hypertrophy. Stenosis of both lateral recesses left more than right. Neural compression could occur at this level, particularly on the left. L5-S1: Distant left hemilaminectomy. Disc space narrowing. Endplate osteophytes and mild annular bulging. No central canal stenosis. Mild bony foraminal narrowing on the left. Sacroiliac joints are unremarkable considering age. IMPRESSION: No evidence of recent fracture. Old minor superior endplate depression on the left at L4. Old minor superior endplate depression at L2, more on the right. No advanced spinal stenosis. Multilevel degenerative disc disease and degenerative facet disease, typical for age. At L4-5, there is bilateral lateral recess narrowing left more than right. Neural compression could possibly occur at this level, particularly on the left. At L5-S1, there has been a distant left hemilaminectomy. There is mild bony foraminal stenosis on the left at this level, not definitely compressive. Electronically Signed   By: Nelson Chimes M.D.   On: 03/06/2019 19:36   DG CHEST PORT 1 VIEW  Result Date: 03/30/2019 CLINICAL DATA:  Acute respiratory failure. EXAM: PORTABLE CHEST 1 VIEW COMPARISON:  03/28/2019 FINDINGS: Right-sided PICC line unchanged with tip just below the cavoatrial junction. Lungs are adequately inflated with interval worsening of bilateral perihilar airspace opacification which may be due to interstitial edema or infection. Stable left base/retrocardiac opacification likely effusion/atelectasis. Mild stable cardiomegaly. Remainder of the exam is unchanged. IMPRESSION: 1. Stable cardiomegaly with worsening bilateral perihilar airspace opacification likely moderate interstitial edema and less likely infection. Stable left base opacification likely effusion with atelectasis. 2.  Right-sided PICC line unchanged. Electronically Signed   By: Marin Olp M.D.   On: 03/30/2019 08:07   DG CHEST PORT 1 VIEW  Result Date: 03/28/2019 CLINICAL DATA:  Increased shortness of breath EXAM: PORTABLE CHEST 1 VIEW COMPARISON:  Chest CT from 4 days ago FINDINGS: Tracheal and esophageal extubation. Right PICC with tip at the lower SVC. There is interstitial coarsening and layering left pleural effusion. Cardiomegaly. Aeration has mildly improved from before. IMPRESSION: Interstitial opacities have  mildly improved from comparison. There is a layering left pleural effusion which may have increased from study 4 days ago. Electronically Signed   By: Monte Fantasia M.D.   On: 03/28/2019 05:32   DG Chest Portable 1 View  Result Date: 03/24/2019 CLINICAL DATA:  ET/OG placement EXAM: PORTABLE CHEST 1 VIEW COMPARISON:  Radiograph 03/24/2019 FINDINGS: Endotracheal tube terminates low in the trachea, 2.4 cm from the carina and could be retracted approximately 2 cm to the mid trachea. A transesophageal tube tip and side port below the GE junction, tip terminating beyond the level of imaging. Telemetry leads overlie the chest. There is stable  cardiomegaly with bilateral pleural effusions, interstitial opacity throughout both lungs and some cephalization of the vascularity. The aorta is calcified. No pneumothorax. No acute osseous or soft tissue abnormality. Degenerative changes are present in the imaged spine and shoulders. IMPRESSION: Endotracheal tube positioned in the low trachea, could be retracted 2 cm to the mid trachea. Transesophageal tube tip and side port beyond the GE junction. Features compatible with congestive heart failure though an atypical infection could also present with a similar appearance. Electronically Signed   By: Lovena Le M.D.   On: 03/24/2019 05:03   DG Chest Port 1 View  Result Date: 03/24/2019 CLINICAL DATA:  Hypoxia EXAM: PORTABLE CHEST 1 VIEW COMPARISON:  03/10/2019 FINDINGS: Mild cardiomegaly with mild diffuse interstitial opacity. Small left pleural effusion. IMPRESSION: Cardiomegaly with small left pleural effusion and mild diffuse interstitial opacity, possibly indicating congestive heart failure. Electronically Signed   By: Ulyses Jarred M.D.   On: 03/24/2019 03:18   DG CHEST PORT 1 VIEW  Result Date: 03/10/2019 CLINICAL DATA:  Tachypnea.  Shortness of breath. EXAM: PORTABLE CHEST 1 VIEW COMPARISON:  06/17/2006 FINDINGS: Cardiomegaly. Chronic aortic atherosclerosis. Chronic interstitial lung markings. Possible venous hypertension with mild interstitial edema. No consolidation, collapse or visible effusion. IMPRESSION: Cardiomegaly. Aortic atherosclerosis. Chronic lung disease. Possible venous hypertension with mild interstitial edema. Electronically Signed   By: Nelson Chimes M.D.   On: 03/10/2019 10:02   DG Knee Complete 4 Views Left  Result Date: 03/06/2019 CLINICAL DATA:  Recent fall with left knee pain, initial encounter EXAM: LEFT KNEE - COMPLETE 4+ VIEW COMPARISON:  None. FINDINGS: Partial knee replacement is noted laterally. Changes of prior fibular fracture are noted. Degenerative changes are  noted in all 3 joint compartments. No joint effusion is seen. No acute fracture is seen. IMPRESSION: Chronic changes without acute abnormality. Electronically Signed   By: Inez Catalina M.D.   On: 03/06/2019 21:14   DG C-Arm 1-60 Min  Result Date: 03/07/2019 CLINICAL DATA:  Femur fracture EXAM: DG C-ARM 1-60 MIN CONTRAST:  None FLUOROSCOPY TIME:  Fluoroscopy Time:  3 minutes 40 seconds Number of Acquired Spot Images: 13 COMPARISON:  03/06/2019 FINDINGS: Thirteen low resolution intraoperative spot views of the left femur. Displaced proximal femoral shaft fracture. Evidence of prior hemiarthroplasty at the knee. Subsequent images demonstrate multiple plate and screw fixation of the proximal femoral fracture. Insertion of intramedullary rod and distal fixating screws with anatomic alignment. Fixating screw placed across the left femoral neck and head. IMPRESSION: Intraoperative fluoroscopic assistance provided during surgical fixation of proximal left femur fracture Electronically Signed   By: Donavan Foil M.D.   On: 03/07/2019 19:03   ECHOCARDIOGRAM COMPLETE  Result Date: 03/08/2019   ECHOCARDIOGRAM REPORT   Patient Name:   Virginia Thompson Date of Exam: 03/08/2019 Medical Rec #:  [id number]      Height:  62.0 in Accession #:    [id number]     Weight:       217.6 lb Date of Birth:  08-11-1928     BSA:          1.98 m Patient Age:    68 years       BP:           68/57 mmHg Patient Gender: F              HR:           95 bpm. Exam Location:  Inpatient Procedure: 2D Echo Indications:    Aortic Stenosis 424.1/135.0  History:        Patient has prior history of Echocardiogram examinations, most                 recent 10/06/2004. CHF; Risk Factors:Hypertension.  Sonographer:    Clayton Lefort RDCS (AE) Referring Phys: [phone] RALPH A NETTEY  Sonographer Comments: Limited patient mobility. IMPRESSIONS  1. Left ventricular ejection fraction, by visual estimation, is 60 to 65%. The left ventricle has normal function. There is  moderately increased left ventricular hypertrophy.  2. Elevated left atrial pressure.  3. Global right ventricle has normal systolic function.The right ventricular size is normal.  4. Left atrial size was severely dilated.  5. Right atrial size was normal.  6. Moderate mitral annular calcification.  7. The mitral valve is normal in structure. No evidence of mitral valve regurgitation. No evidence of mitral stenosis.  8. The tricuspid valve is normal in structure.  9. The aortic valve has an indeterminant number of cusps. Aortic valve regurgitation is not visualized. Moderate aortic valve stenosis. 10. The pulmonic valve was not well visualized. Pulmonic valve regurgitation is not visualized. 11. The left ventricle has no regional wall motion abnormalities. FINDINGS  Left Ventricle: Left ventricular ejection fraction, by visual estimation, is 60 to 65%. The left ventricle has normal function. The left ventricle has no regional wall motion abnormalities. There is moderately increased left ventricular hypertrophy. Elevated left atrial pressure. Right Ventricle: The right ventricular size is normal.Global RV systolic function is has normal systolic function. Left Atrium: Left atrial size was severely dilated. Right Atrium: Right atrial size was normal in size Pericardium: There is no evidence of pericardial effusion. Mitral Valve: The mitral valve is normal in structure. Moderate mitral annular calcification. No evidence of mitral valve regurgitation. No evidence of mitral valve stenosis by observation. MV peak gradient, 19.2 mmHg. Tricuspid Valve: The tricuspid valve is normal in structure. Tricuspid valve regurgitation is trivial. Aortic Valve: The aortic valve has an indeterminant number of cusps. Aortic valve regurgitation is not visualized. Moderate aortic stenosis is present. Aortic valve mean gradient measures 20.0 mmHg. Aortic valve peak gradient measures 32.5 mmHg. Aortic valve area, by VTI measures 1.13 cm.  Pulmonic Valve: The pulmonic valve was not well visualized. Pulmonic valve regurgitation is not visualized. Pulmonic regurgitation is not visualized. Aorta: The aortic root is normal in size and structure. Venous: The inferior vena cava was not well visualized. IAS/Shunts: No atrial level shunt detected by color flow Doppler. Additional Comments: Normal LV systolic function; moderate LVH; moderate AS (mean gradient 21 mmHg); mean gradient across MV elevated (11 mmHg) but no MS by pressure half time; severe LAE.  LEFT VENTRICLE PLAX 2D LVIDd:         3.40 cm  Diastology LVIDs:         3.28 cm  LV e' lateral:  5.44 cm/s LV PW:         1.77 cm  LV E/e' lateral: 31.6 LV IVS:        1.72 cm  LV e' medial:    7.51 cm/s LVOT diam:     1.80 cm  LV E/e' medial:  22.9 LV SV:         4 ml LV SV Index:   1.85 LVOT Area:     2.54 cm  RIGHT VENTRICLE RV Basal diam:  2.79 cm RV S prime:     13.90 cm/s TAPSE (M-mode): 2.1 cm LEFT ATRIUM              Index       RIGHT ATRIUM           Index LA diam:        4.40 cm  2.22 cm/m  RA Area:     11.50 cm LA Vol (A2C):   109.0 ml 55.01 ml/m RA Volume:   20.80 ml  10.50 ml/m LA Vol (A4C):   81.6 ml  41.18 ml/m LA Biplane Vol: 94.7 ml  47.79 ml/m  AORTIC VALVE AV Area (Vmax):    1.26 cm AV Area (Vmean):   1.14 cm AV Area (VTI):     1.13 cm AV Vmax:           285.00 cm/s AV Vmean:          214.000 cm/s AV VTI:            0.607 m AV Peak Grad:      32.5 mmHg AV Mean Grad:      20.0 mmHg LVOT Vmax:         141.50 cm/s LVOT Vmean:        95.450 cm/s LVOT VTI:          0.270 m LVOT/AV VTI ratio: 0.45  AORTA Ao Root diam: 2.80 cm MITRAL VALVE                         TRICUSPID VALVE MV Area (PHT): 3.19 cm              TR Peak grad:   15.8 mmHg MV Peak grad:  19.2 mmHg             TR Vmax:        266.00 cm/s MV Mean grad:  11.0 mmHg MV Vmax:       2.19 m/s              SHUNTS MV Vmean:      154.0 cm/s            Systemic VTI:  0.27 m MV VTI:        0.43 m                Systemic Diam:  1.80 cm MV PHT:        69.02 msec MV Decel Time: 238 msec MV E velocity: 172.00 cm/s 103 cm/s MV A velocity: 185.00 cm/s 70.3 cm/s MV E/A ratio:  0.93        1.5  Kirk Ruths MD Electronically signed by Kirk Ruths MD Signature Date/Time: 03/08/2019/2:43:58 PM    Final    CT Renal Stone Study  Addendum Date: 03/06/2019   ADDENDUM REPORT: 03/06/2019 20:07 ADDENDUM: Upon further evaluation, an acute displaced fracture deformity of the proximal left femur is seen. This involves the proximal left femoral shaft,  just distal to the inter trochanteric region. Approximately 1/2 shaft width medial displacement of the distal fracture site is noted. Electronically Signed   By: Virgina Norfolk M.D.   On: 03/06/2019 20:07   Result Date: 03/06/2019 CLINICAL DATA:  Flank pain. EXAM: CT ABDOMEN AND PELVIS WITHOUT CONTRAST TECHNIQUE: Multidetector CT imaging of the abdomen and pelvis was performed following the standard protocol without IV contrast. COMPARISON:  None. FINDINGS: Lower chest: Mild atelectasis is seen within the bilateral lung bases. Marked severity coronary artery calcification is seen. Hepatobiliary: Numerous punctate calcified granulomas are seen scattered throughout the liver. The gallbladder is not identified. Pancreas: Unremarkable. No pancreatic ductal dilatation or surrounding inflammatory changes. Spleen: The spleen is absent. Adrenals/Urinary Tract: Adrenal glands are unremarkable. Kidneys are normal, without focal lesions or hydronephrosis. A 7 mm nonobstructing renal stone is seen within the mid right kidney. Bladder is unremarkable. Stomach/Bowel: Stomach is within normal limits. The appendix is not identified. No evidence of bowel wall thickening, distention, or inflammatory changes. Noninflamed diverticula are seen throughout the sigmoid colon. Vascular/Lymphatic: Marked severity aortic atherosclerosis. No enlarged abdominal or pelvic lymph nodes. Reproductive: Status post hysterectomy. No  adnexal masses. Other: A surgical scar is seen along the midline of the anterior abdominal wall Musculoskeletal: Multilevel degenerative changes seen throughout the lumbar spine. IMPRESSION: 1. 7 mm nonobstructing renal stone within the right kidney. 2. Absent spleen. 3. Sigmoid diverticulosis. Electronically Signed: By: Virgina Norfolk M.D. On: 03/06/2019 19:49   DG Hip Unilat W or Wo Pelvis 2-3 Views Left  Result Date: 03/06/2019 CLINICAL DATA:  Recent fall with left hip pain, initial encounter EXAM: DG HIP (WITH OR WITHOUT PELVIS) 2-3V LEFT COMPARISON:  None. FINDINGS: Pelvic ring is intact. There is a transverse fracture through the proximal femoral shaft approximately 2 cm below the intratrochanteric region. Femoral head is well seated. No soft tissue abnormality is noted. Posterior displacement of the distal fracture fragment is seen. IMPRESSION: Transverse fracture as described in the proximal femoral shaft. Electronically Signed   By: Inez Catalina M.D.   On: 03/06/2019 21:14   DG FEMUR MIN 2 VIEWS LEFT  Result Date: 03/07/2019 CLINICAL DATA:  Known left femoral fracture EXAM: LEFT FEMUR 2 VIEWS COMPARISON:  Films from the previous day FLUOROSCOPY TIME:  Radiation Exposure Index (as provided by the fluoroscopic device): Not available If the device does not provide the exposure index: Fluoroscopy Time:  3 minutes 40 seconds Number of Acquired Images:  13 FINDINGS: Initial images again demonstrate the proximal left femoral shaft fracture. Fixation sideplate is noted in the proximal femur with multiple fixation screws. Medullary rod is in place as well. Proximal and distal fixation screws are seen. The fracture fragments are in near anatomic alignment. IMPRESSION: Status post ORIF of proximal left femoral fracture Electronically Signed   By: Inez Catalina M.D.   On: 03/07/2019 19:01   DG FEMUR PORT MIN 2 VIEWS LEFT  Result Date: 03/07/2019 CLINICAL DATA:  Status post ORIF of left femoral fracture EXAM:  LEFT FEMUR PORTABLE 2 VIEWS COMPARISON:  03/06/2019 FINDINGS: Interval placement of medullary rod with proximal and distal fixation screws is seen. A lateral fixation sideplate along the proximal femur is seen. Prior partial knee replacement is noted. The fracture fragments are in near anatomic alignment. Patellofemoral degenerative changes are seen. IMPRESSION: Status post ORIF of left femoral fracture. Electronically Signed   By: Inez Catalina M.D.   On: 03/07/2019 20:04   ECHOCARDIOGRAM LIMITED  Result Date: 03/24/2019   ECHOCARDIOGRAM  REPORT   Patient Name:   Virginia Thompson Date of Exam: 03/24/2019 Medical Rec #:  [id number]      Height:       64.0 in Accession #:    [id number]     Weight:       212.3 lb Date of Birth:  1928-09-01     BSA:          2.01 m Patient Age:    77 years       BP:           136/107 mmHg Patient Gender: F              HR:           84 bpm. Exam Location:  Inpatient Procedure: Limited Echo, Color Doppler and Cardiac Doppler Indications:    dyspnea 786.09  History:        Patient has prior history of Echocardiogram examinations, most                 recent 03/08/2019. CHF; Risk Factors:Sleep Apnea.  Sonographer:    Johny Chess Referring Phys: [id number] [place]  1. Left ventricular ejection fraction, by visual estimation, is 60 to 65%. The left ventricle has normal function. Left ventricular septal wall thickness was mildly increased. Mildly increased left ventricular posterior wall thickness. There is mildly increased left ventricular hypertrophy.  2. Left ventricular diastolic parameters are consistent with Grade I diastolic dysfunction (impaired relaxation).  3. The left ventricle has no regional wall motion abnormalities.  4. Global right ventricle has normal systolic function.The right ventricular size is normal. No increase in right ventricular wall thickness.  5. Left atrial size was normal.  6. Right atrial size was normal.  7. Moderate mitral annular  calcification.  8. The mitral valve is normal in structure. No evidence of mitral valve regurgitation. No evidence of mitral stenosis.  9. The tricuspid valve is normal in structure. 10. The tricuspid valve is normal in structure. Tricuspid valve regurgitation is mild. 11. Aortic valve area, by VTI measures 1.09 cm. 12. Aortic valve mean gradient measures 19.0 mmHg. 13. Aortic valve peak gradient measures 34.9 mmHg. 14. The aortic valve is tricuspid. Aortic valve regurgitation is not visualized. Mild to moderate aortic valve stenosis. 15. The pulmonic valve was normal in structure. Pulmonic valve regurgitation is not visualized. 16. Normal pulmonary artery systolic pressure. 17. The inferior vena cava is dilated in size with >50% respiratory variability, suggesting right atrial pressure of 8 mmHg. FINDINGS  Left Ventricle: Left ventricular ejection fraction, by visual estimation, is 60 to 65%. The left ventricle has normal function. The left ventricle has no regional wall motion abnormalities. Mildly increased left ventricular posterior wall thickness. There is mildly increased left ventricular hypertrophy. Left ventricular diastolic parameters are consistent with Grade I diastolic dysfunction (impaired relaxation). Normal left atrial pressure. Right Ventricle: The right ventricular size is normal. No increase in right ventricular wall thickness. Global RV systolic function is has normal systolic function. The tricuspid regurgitant velocity is 2.19 m/s, and with an assumed right atrial pressure  of 8 mmHg, the estimated right ventricular systolic pressure is normal at 27.2 mmHg. Left Atrium: Left atrial size was normal in size. Right Atrium: Right atrial size was normal in size Pericardium: There is no evidence of pericardial effusion. Mitral Valve: The mitral valve is normal in structure. Moderate mitral annular calcification. No evidence of mitral valve regurgitation. No evidence of mitral valve stenosis by  observation. Tricuspid Valve: The tricuspid valve is normal in structure. Tricuspid valve regurgitation is mild. Aortic Valve: The aortic valve is tricuspid. . There is moderate thickening and moderate calcification of the aortic valve. Aortic valve regurgitation is not visualized. Mild to moderate aortic stenosis is present. There is moderate thickening of the aortic valve. There is moderate calcification of the aortic valve. Aortic valve mean gradient measures 19.0 mmHg. Aortic valve peak gradient measures 34.9 mmHg. Aortic valve area, by VTI measures 1.09 cm. Pulmonic Valve: The pulmonic valve was normal in structure. Pulmonic valve regurgitation is not visualized. Pulmonic regurgitation is not visualized. Aorta: The aortic root, ascending aorta and aortic arch are all structurally normal, with no evidence of dilitation or obstruction. Venous: The inferior vena cava is dilated in size with greater than 50% respiratory variability, suggesting right atrial pressure of 8 mmHg. IAS/Shunts: No atrial level shunt detected by color flow Doppler. There is no evidence of a patent foramen ovale. No ventricular septal defect is seen or detected. There is no evidence of an atrial septal defect.  LEFT VENTRICLE PLAX 2D LVIDd:         3.40 cm LVIDs:         2.30 cm LV PW:         1.30 cm LV IVS:        1.20 cm LVOT diam:     1.70 cm LV SV:         29 ml LV SV Index:   13.77 LVOT Area:     2.27 cm  RIGHT VENTRICLE TAPSE (M-mode): 1.4 cm LEFT ATRIUM         Index LA diam:    4.40 cm 2.19 cm/m  AORTIC VALVE AV Area (Vmax):    1.10 cm AV Area (Vmean):   1.05 cm AV Area (VTI):     1.09 cm AV Vmax:           295.50 cm/s AV Vmean:          203.500 cm/s AV VTI:            0.549 m AV Peak Grad:      34.9 mmHg AV Mean Grad:      19.0 mmHg LVOT Vmax:         143.50 cm/s LVOT Vmean:        94.100 cm/s LVOT VTI:          0.263 m LVOT/AV VTI ratio: 0.48  AORTA Ao Root diam: 3.00 cm MITRAL VALVE                         TRICUSPID VALVE  MV Area (PHT): 2.42 cm              TR Peak grad:   19.2 mmHg MV PHT:        91.06 msec            TR Vmax:        219.00 cm/s MV Decel Time: 314 msec MV E velocity: 136.00 cm/s 103 cm/s  SHUNTS MV A velocity: 144.00 cm/s 70.3 cm/s Systemic VTI:  0.26 m MV E/A ratio:  0.94        1.5       Systemic Diam: 1.70 cm  Skeet Latch MD Electronically signed by Skeet Latch MD Signature Date/Time: 03/24/2019/4:28:47 PM    Final    [place] EKG SITE RITE  Result Date: 03/24/2019 If Site Rite image not  attached, placement could not be confirmed due to current cardiac rhythm.      Discharge Exam: Vitals:   03/31/19 0810 03/31/19 1033  BP:  (!) 116/53  Pulse:  86  Resp:  18  Temp:  (!) 97.1 F (36.2 C)  SpO2: 99%     General: Pt is alert, awake, not in acute distress Cardiovascular: RRR, S1/S2 +, no edema Respiratory: CTA bilaterally, minimal expiratory wheezes, no conversational dyspnea  Abdominal: Soft, NT, ND, bowel sounds + Extremities: no edema, no cyanosis Psych: Normal mood and affect, stable judgement and insight     The results of significant diagnostics from this hospitalization (including imaging, microbiology, ancillary and laboratory) are listed below for reference.     Microbiology: Recent Results (from the past 240 hour(s))  Respiratory Panel by RT PCR (Flu A&B, Covid) - Nasopharyngeal Swab     Status: None   Collection Time: 03/24/19  2:58 AM   Specimen: Nasopharyngeal Swab  Result Value Ref Range Status   SARS Coronavirus 2 by RT PCR NEGATIVE NEGATIVE Final    Comment: (NOTE) SARS-CoV-2 target nucleic acids are NOT DETECTED. The SARS-CoV-2 RNA is generally detectable in upper respiratoy specimens during the acute phase of infection. The lowest concentration of SARS-CoV-2 viral copies this assay can detect is 131 copies/mL. A negative result does not preclude SARS-Cov-2 infection and should not be used as the sole basis for treatment or other patient management  decisions. A negative result may occur with  improper specimen collection/handling, submission of specimen other than nasopharyngeal swab, presence of viral mutation(s) within the areas targeted by this assay, and inadequate number of viral copies (<131 copies/mL). A negative result must be combined with clinical observations, patient history, and epidemiological information. The expected result is Negative. Fact Sheet for Patients:  PinkCheek.be Fact Sheet for Healthcare Providers:  GravelBags.it This test is not yet ap proved or cleared by the [place] FDA and  has been authorized for detection and/or diagnosis of SARS-CoV-2 by FDA under an Emergency Use Authorization (EUA). This EUA will remain  in effect (meaning this test can be used) for the duration of the COVID-19 declaration under Section 564(b)(1) of the Act, 21 U.S.C. section 360bbb-3(b)(1), unless the authorization is terminated or revoked sooner.    Influenza A by PCR NEGATIVE NEGATIVE Final   Influenza B by PCR NEGATIVE NEGATIVE Final    Comment: (NOTE) The Xpert Xpress SARS-CoV-2/FLU/RSV assay is intended as an aid in  the diagnosis of influenza from Nasopharyngeal swab specimens and  should not be used as a sole basis for treatment. Nasal washings and  aspirates are unacceptable for Xpert Xpress SARS-CoV-2/FLU/RSV  testing. Fact Sheet for Patients: PinkCheek.be Fact Sheet for Healthcare Providers: GravelBags.it This test is not yet approved or cleared by the [place] FDA and  has been authorized for detection and/or diagnosis of SARS-CoV-2 by  FDA under an Emergency Use Authorization (EUA). This EUA will remain  in effect (meaning this test can be used) for the duration of the  Covid-19 declaration under Section 564(b)(1) of the Act, 21  U.S.C. section 360bbb-3(b)(1), unless the authorization  is  terminated or revoked. Performed at [facility], [place] [address], [place], [place] [address]   Blood Culture (routine x 2)     Status: None   Collection Time: 03/24/19  3:00 AM   Specimen: BLOOD LEFT HAND  Result Value Ref Range Status   Specimen Description BLOOD LEFT HAND  Final   Special  Requests   Final    BOTTLES DRAWN AEROBIC AND ANAEROBIC Blood Culture results may not be optimal due to an excessive volume of blood received in culture bottles   Culture   Final    NO GROWTH 5 DAYS Performed at [facility], [place] [address], [place], [place] [address]    Report Status 03/29/2019 FINAL  Final  Blood Culture (routine x 2)     Status: Abnormal   Collection Time: 03/24/19  3:05 AM   Specimen: BLOOD  Result Value Ref Range Status   Specimen Description BLOOD LEFT ANTECUBITAL  Final   Special Requests   Final    BOTTLES DRAWN AEROBIC AND ANAEROBIC Blood Culture adequate volume   Culture  Setup Time   Final    AEROBIC BOTTLE ONLY GRAM POSITIVE COCCI CRITICAL RESULT CALLED TO, READ BACK BY AND VERIFIED WITH: KAREN AMEND  PHARM @ 0323 ON 03/25/19 BY ROBINSON Z.     Culture (A)  Final    STAPHYLOCOCCUS SPECIES (COAGULASE NEGATIVE) THE SIGNIFICANCE OF ISOLATING THIS ORGANISM FROM A SINGLE SET OF BLOOD CULTURES WHEN MULTIPLE SETS ARE DRAWN IS UNCERTAIN. PLEASE NOTIFY THE MICROBIOLOGY DEPARTMENT WITHIN ONE WEEK IF SPECIATION AND SENSITIVITIES ARE REQUIRED. Performed at [facility], [place] [address] [place], [place] [address]    Report Status 03/26/2019 FINAL  Final  Urine culture     Status: None   Collection Time: 03/24/19  3:20 AM   Specimen: In/Out Cath Urine  Result Value Ref Range Status   Specimen Description IN/OUT CATH URINE  Final   Special Requests NONE  Final   Culture   Final    NO GROWTH Performed at [facility], [place] [address] [place], [place] [address]    Report Status 03/24/2019 FINAL  Final  MRSA PCR Screening     Status: None    Collection Time: 03/24/19  7:42 AM   Specimen: Nasal Mucosa; Nasopharyngeal  Result Value Ref Range Status   MRSA by PCR NEGATIVE NEGATIVE Final    Comment:        The GeneXpert MRSA Assay (FDA approved for NASAL specimens only), is one component of a comprehensive MRSA colonization surveillance program. It is not intended to diagnose MRSA infection nor to guide or monitor treatment for MRSA infections. Performed at [facility], [place] [address], [place], [place] [address]      Labs: BNP (last 3 results) Recent Labs    03/24/19 0255 03/28/19 0530  BNP 2,540.9* [id number]*   Basic Metabolic Panel: Recent Labs  Lab 03/25/19 0349 03/25/19 0349 03/26/19 0416 03/27/19 0352 03/29/19 0306 03/30/19 0444 03/31/19 0500  NA 141   < > 137 138 138 139 139  K 3.4*   < > 3.5 3.7 3.4* 3.7 3.9  CL 94*   < > 92* 94* 93* 93* 92*  CO2 36*   < > 36* 36* 39* 37* 40*  GLUCOSE 133*   < > 157* 109* 100* 105* 120*  BUN 20   < > 24* 32* 15 9 13   CREATININE 0.76   < > 0.70 0.75 0.48 0.56 0.70  CALCIUM 8.1*   < > 7.7* 8.1* 8.3* 8.3* 8.5*  MG 2.1  --  2.3  --  2.3 2.3  --   PHOS 2.2*  --  3.1  --   --   --   --    < > = values in this interval not displayed.  Liver Function Tests: No results for input(s): AST, ALT, ALKPHOS, BILITOT, PROT, ALBUMIN in the last 168 hours. No results for input(s): LIPASE, AMYLASE in the last 168 hours. No results for input(s): AMMONIA in the last 168 hours. CBC: Recent Labs  Lab 03/25/19 0349 03/26/19 0416 03/27/19 0352 03/28/19 0530 03/31/19 0500  WBC 10.0 11.3* 10.4 6.8 6.3  HGB 8.7* 8.2* 8.6* 9.2* 8.5*  HCT 28.6* 27.3* 28.8* 31.7* 29.8*  MCV 100.4* 102.2* 104.0* 103.9* 107.2*  PLT 377 324 299 299 313   Cardiac Enzymes: No results for input(s): CKTOTAL, CKMB, CKMBINDEX, TROPONINI in the last 168 hours. BNP: Invalid input(s): POCBNP CBG: Recent Labs  Lab 03/27/19 0304 03/27/19 0744 03/27/19 1058 03/27/19 2036 03/28/19 0049  GLUCAP 107*  88 105* 106* 97   D-Dimer No results for input(s): DDIMER in the last 72 hours. Hgb A1c No results for input(s): HGBA1C in the last 72 hours. Lipid Profile No results for input(s): CHOL, HDL, LDLCALC, TRIG, CHOLHDL, LDLDIRECT in the last 72 hours. Thyroid function studies No results for input(s): TSH, T4TOTAL, T3FREE, THYROIDAB in the last 72 hours.  Invalid input(s): FREET3 Anemia work up No results for input(s): VITAMINB12, FOLATE, FERRITIN, TIBC, IRON, RETICCTPCT in the last 72 hours. Urinalysis    Component Value Date/Time   COLORURINE AMBER (A) 03/24/2019 0255   APPEARANCEUR HAZY (A) 03/24/2019 0255   LABSPEC 1.018 03/24/2019 0255   PHURINE 5.0 03/24/2019 0255   GLUCOSEU NEGATIVE 03/24/2019 0255   HGBUR SMALL (A) 03/24/2019 0255   BILIRUBINUR NEGATIVE 03/24/2019 0255   KETONESUR NEGATIVE 03/24/2019 0255   PROTEINUR 100 (A) 03/24/2019 0255   NITRITE NEGATIVE 03/24/2019 0255   LEUKOCYTESUR NEGATIVE 03/24/2019 0255   Sepsis Labs Invalid input(s): PROCALCITONIN,  WBC,  LACTICIDVEN Microbiology Recent Results (from the past 240 hour(s))  Respiratory Panel by RT PCR (Flu A&B, Covid) - Nasopharyngeal Swab     Status: None   Collection Time: 03/24/19  2:58 AM   Specimen: Nasopharyngeal Swab  Result Value Ref Range Status   SARS Coronavirus 2 by RT PCR NEGATIVE NEGATIVE Final    Comment: (NOTE) SARS-CoV-2 target nucleic acids are NOT DETECTED. The SARS-CoV-2 RNA is generally detectable in upper respiratoy specimens during the acute phase of infection. The lowest concentration of SARS-CoV-2 viral copies this assay can detect is 131 copies/mL. A negative result does not preclude SARS-Cov-2 infection and should not be used as the sole basis for treatment or other patient management decisions. A negative result may occur with  improper specimen collection/handling, submission of specimen other than nasopharyngeal swab, presence of viral mutation(s) within the areas targeted by  this assay, and inadequate number of viral copies (<131 copies/mL). A negative result must be combined with clinical observations, patient history, and epidemiological information. The expected result is Negative. Fact Sheet for Patients:  PinkCheek.be Fact Sheet for Healthcare Providers:  GravelBags.it This test is not yet ap proved or cleared by the [place] FDA and  has been authorized for detection and/or diagnosis of SARS-CoV-2 by FDA under an Emergency Use Authorization (EUA). This EUA will remain  in effect (meaning this test can be used) for the duration of the COVID-19 declaration under Section 564(b)(1) of the Act, 21 U.S.C. section 360bbb-3(b)(1), unless the authorization is terminated or revoked sooner.    Influenza A by PCR NEGATIVE NEGATIVE Final   Influenza B by PCR NEGATIVE NEGATIVE Final    Comment: (NOTE) The Xpert Xpress SARS-CoV-2/FLU/RSV assay is intended as an aid in  the  diagnosis of influenza from Nasopharyngeal swab specimens and  should not be used as a sole basis for treatment. Nasal washings and  aspirates are unacceptable for Xpert Xpress SARS-CoV-2/FLU/RSV  testing. Fact Sheet for Patients: PinkCheek.be Fact Sheet for Healthcare Providers: GravelBags.it This test is not yet approved or cleared by the [place] FDA and  has been authorized for detection and/or diagnosis of SARS-CoV-2 by  FDA under an Emergency Use Authorization (EUA). This EUA will remain  in effect (meaning this test can be used) for the duration of the  Covid-19 declaration under Section 564(b)(1) of the Act, 21  U.S.C. section 360bbb-3(b)(1), unless the authorization is  terminated or revoked. Performed at [facility], [place] [address], [place], [place] [address]   Blood Culture (routine x 2)     Status: None   Collection Time: 03/24/19  3:00 AM    Specimen: BLOOD LEFT HAND  Result Value Ref Range Status   Specimen Description BLOOD LEFT HAND  Final   Special Requests   Final    BOTTLES DRAWN AEROBIC AND ANAEROBIC Blood Culture results may not be optimal due to an excessive volume of blood received in culture bottles   Culture   Final    NO GROWTH 5 DAYS Performed at [facility], [place] [address], [place], [place] [address]    Report Status 03/29/2019 FINAL  Final  Blood Culture (routine x 2)     Status: Abnormal   Collection Time: 03/24/19  3:05 AM   Specimen: BLOOD  Result Value Ref Range Status   Specimen Description BLOOD LEFT ANTECUBITAL  Final   Special Requests   Final    BOTTLES DRAWN AEROBIC AND ANAEROBIC Blood Culture adequate volume   Culture  Setup Time   Final    AEROBIC BOTTLE ONLY GRAM POSITIVE COCCI CRITICAL RESULT CALLED TO, READ BACK BY AND VERIFIED WITH: KAREN AMEND  PHARM @ 0323 ON 03/25/19 BY ROBINSON Z.     Culture (A)  Final    STAPHYLOCOCCUS SPECIES (COAGULASE NEGATIVE) THE SIGNIFICANCE OF ISOLATING THIS ORGANISM FROM A SINGLE SET OF BLOOD CULTURES WHEN MULTIPLE SETS ARE DRAWN IS UNCERTAIN. PLEASE NOTIFY THE MICROBIOLOGY DEPARTMENT WITHIN ONE WEEK IF SPECIATION AND SENSITIVITIES ARE REQUIRED. Performed at [facility], [place] [address], [place], [place] [address]    Report Status 03/26/2019 FINAL  Final  Urine culture     Status: None   Collection Time: 03/24/19  3:20 AM   Specimen: In/Out Cath Urine  Result Value Ref Range Status   Specimen Description IN/OUT CATH URINE  Final   Special Requests NONE  Final   Culture   Final    NO GROWTH Performed at [facility], [place] [address], [place], [place] [address]    Report Status 03/24/2019 FINAL  Final  MRSA PCR Screening     Status: None   Collection Time: 03/24/19  7:42 AM   Specimen: Nasal Mucosa; Nasopharyngeal  Result Value Ref Range Status   MRSA by PCR NEGATIVE NEGATIVE Final    Comment:        The GeneXpert MRSA Assay  (FDA approved for NASAL specimens only), is one component of a comprehensive MRSA colonization surveillance program. It is not intended to diagnose MRSA infection nor to guide or monitor treatment for MRSA infections. Performed at [facility], [place] [address] [place], [place] [address]      Patient was seen and examined on the day of  discharge and was found to be in stable condition. Time coordinating discharge: 35 minutes including assessment and coordination of care, as well as examination of the patient.   SIGNED:  Dessa Phi, DO [facility] 03/31/2019, 12:13 PM

## 2019-03-31 NOTE — Progress Notes (Signed)
Redness noted to patient buttock area. No complications noted. Audie Clear, LPN

## 2019-04-01 ENCOUNTER — Inpatient Hospital Stay (HOSPITAL_COMMUNITY): Payer: Medicare Other | Admitting: Physical Therapy

## 2019-04-01 ENCOUNTER — Inpatient Hospital Stay (HOSPITAL_COMMUNITY): Payer: Medicare Other | Admitting: Occupational Therapy

## 2019-04-01 DIAGNOSIS — E46 Unspecified protein-calorie malnutrition: Secondary | ICD-10-CM

## 2019-04-01 DIAGNOSIS — D591 Autoimmune hemolytic anemia, unspecified: Secondary | ICD-10-CM

## 2019-04-01 DIAGNOSIS — K5901 Slow transit constipation: Secondary | ICD-10-CM

## 2019-04-01 DIAGNOSIS — Z9981 Dependence on supplemental oxygen: Secondary | ICD-10-CM

## 2019-04-01 DIAGNOSIS — I959 Hypotension, unspecified: Secondary | ICD-10-CM

## 2019-04-01 DIAGNOSIS — G894 Chronic pain syndrome: Secondary | ICD-10-CM

## 2019-04-01 DIAGNOSIS — I5033 Acute on chronic diastolic (congestive) heart failure: Secondary | ICD-10-CM

## 2019-04-01 DIAGNOSIS — G4733 Obstructive sleep apnea (adult) (pediatric): Secondary | ICD-10-CM

## 2019-04-01 DIAGNOSIS — E8809 Other disorders of plasma-protein metabolism, not elsewhere classified: Secondary | ICD-10-CM

## 2019-04-01 DIAGNOSIS — I4891 Unspecified atrial fibrillation: Secondary | ICD-10-CM

## 2019-04-01 LAB — COMPREHENSIVE METABOLIC PANEL
ALT: 35 U/L (ref 0–44)
AST: 28 U/L (ref 15–41)
Albumin: 2.7 g/dL — ABNORMAL LOW (ref 3.5–5.0)
Alkaline Phosphatase: 92 U/L (ref 38–126)
Anion gap: 9 (ref 5–15)
BUN: 11 mg/dL (ref 8–23)
CO2: 42 mmol/L — ABNORMAL HIGH (ref 22–32)
Calcium: 8.4 mg/dL — ABNORMAL LOW (ref 8.9–10.3)
Chloride: 89 mmol/L — ABNORMAL LOW (ref 98–111)
Creatinine, Ser: 0.62 mg/dL (ref 0.44–1.00)
GFR calc Af Amer: >60 mL/min (ref >60)
GFR calc non Af Amer: >60 mL/min (ref >60)
Glucose, Bld: 90 mg/dL (ref 70–99)
Potassium: 3.6 mmol/L (ref 3.5–5.1)
Sodium: 140 mmol/L (ref 135–145)
Total Bilirubin: 0.4 mg/dL (ref 0.3–1.2)
Total Protein: 5.7 g/dL — ABNORMAL LOW (ref 6.5–8.1)

## 2019-04-01 LAB — CBC WITH DIFFERENTIAL/PLATELET
Abs Immature Granulocytes: 0.04 10*3/uL (ref 0.00–0.07)
Basophils Absolute: 0 10*3/uL (ref 0.0–0.1)
Basophils Relative: 0 %
Eosinophils Absolute: 0 10*3/uL (ref 0.0–0.5)
Eosinophils Relative: 0 %
HCT: 31.3 % — ABNORMAL LOW (ref 36.0–46.0)
Hemoglobin: 8.9 g/dL — ABNORMAL LOW (ref 12.0–15.0)
Immature Granulocytes: 1 %
Lymphocytes Relative: 19 %
Lymphs Abs: 1.3 10*3/uL (ref 0.7–4.0)
MCH: 30.5 pg (ref 26.0–34.0)
MCHC: 28.4 g/dL — ABNORMAL LOW (ref 30.0–36.0)
MCV: 107.2 fL — ABNORMAL HIGH (ref 80.0–100.0)
Monocytes Absolute: 1.1 10*3/uL — ABNORMAL HIGH (ref 0.1–1.0)
Monocytes Relative: 16 %
Neutro Abs: 4.5 10*3/uL (ref 1.7–7.7)
Neutrophils Relative %: 64 %
Platelets: 330 10*3/uL (ref 150–400)
RBC: 2.92 MIL/uL — ABNORMAL LOW (ref 3.87–5.11)
RDW: 15.9 % — ABNORMAL HIGH (ref 11.5–15.5)
WBC: 7 10*3/uL (ref 4.0–10.5)
nRBC: 1.7 % — ABNORMAL HIGH (ref 0.0–0.2)

## 2019-04-01 LAB — GLUCOSE, CAPILLARY: Glucose-Capillary: 67 mg/dL — ABNORMAL LOW (ref 70–99)

## 2019-04-01 MED ORDER — CHLORHEXIDINE GLUCONATE CLOTH 2 % EX PADS
6.0000 | MEDICATED_PAD | Freq: Every day | CUTANEOUS | Status: DC
  Administered 2019-04-01 – 2019-04-05 (×4): 6 via TOPICAL

## 2019-04-01 MED ORDER — LEVALBUTEROL HCL 0.63 MG/3ML IN NEBU
0.6300 mg | INHALATION_SOLUTION | Freq: Three times a day (TID) | RESPIRATORY_TRACT | Status: DC
  Administered 2019-04-01 – 2019-04-02 (×4): 0.63 mg via RESPIRATORY_TRACT
  Filled 2019-04-01 (×5): qty 3

## 2019-04-01 MED ORDER — PRO-STAT SUGAR FREE PO LIQD
30.0000 mL | Freq: Two times a day (BID) | ORAL | Status: DC
  Administered 2019-04-01 – 2019-04-05 (×8): 30 mL via ORAL
  Filled 2019-04-01 (×9): qty 30

## 2019-04-01 NOTE — Progress Notes (Signed)
Inpatient Rehabilitation  Patient information reviewed and entered into eRehab system by Mclane Arora M. Waldine Zenz, M.A., CCC/SLP, PPS Coordinator.  Information including medical coding, functional ability and quality indicators will be reviewed and updated through discharge.    

## 2019-04-01 NOTE — Progress Notes (Signed)
Occupational Therapy Session Note  Patient Details  Name: Virginia Thompson MRN: [id number] Date of Birth: 1928-08-20  Today's Date: 04/01/2019 OT Individual Time: [id number] OT Individual Time Calculation (min): 40 min    Short Term Goals: Week 1:  OT Short Term Goal 1 (Week 1): Pt will complete bathing with min assist at sit > stand level OT Short Term Goal 2 (Week 1): Pt will complete LB dressing with mod assist (including socks/shoes) OT Short Term Goal 3 (Week 1): Pt will complete toilet transfer with Min assist OT Short Term Goal 4 (Week 1): Pt will complete 1 grooming task in standing to demonstrate increased standing tolerance  Skilled Therapeutic Interventions/Progress Updates:  Treatment session with focus on bed mobility, sit > stand, and standing tolerance.  Pt received supine in bed reporting fatigue but willing to engage in therapy session.  Required max assist bed mobility with cues for sequencing to decrease burden of care.  Engaged in sit > stand from EOB with pt requiring mod assist.  Blocked practice sit > stand with pt demonstrating good carryover, however as she fatigued she required increased assist.  Pt noted to have been incontinent of urine, therefore completed hygiene and changed brief while pt maintained standing.  Pt required total assist for hygiene and clothing management, but only CGA for standing balance (once upright).  Completed side stepping with shuffling pattern to progress towards HOB prior to returning to bed.  Max assist sit > supine with therapist advancing BLE in to bed.  Repositioned and left semi-reclined with all needs in reach.  Therapy Documentation Precautions:  Precautions Precautions: Fall Restrictions Weight Bearing Restrictions: Yes LLE Weight Bearing: Weight bearing as tolerated Pain:  Pt with no c/o pain   Therapy/Group: Individual Therapy  Simonne Come 04/01/2019, 2:51 PM

## 2019-04-01 NOTE — Plan of Care (Signed)
  Problem: Consults Goal: RH GENERAL PATIENT EDUCATION Description: See Patient Education module for education specifics. Outcome: Progressing Goal: Skin Care Protocol Initiated - if Braden Score 18 or less Description: If consults are not indicated, leave blank or document N/A Outcome: Progressing Goal: Nutrition Consult-if indicated Outcome: Progressing Goal: Diabetes Guidelines if Diabetic/Glucose > 140 Description: If diabetic or lab glucose is > 140 mg/dl - Initiate Diabetes/Hyperglycemia Guidelines & Document Interventions  Outcome: Progressing   Problem: RH BOWEL ELIMINATION Goal: RH STG MANAGE BOWEL WITH ASSISTANCE Description: STG Manage Bowel with Assistance. Outcome: Progressing   Problem: RH BLADDER ELIMINATION Goal: RH STG MANAGE BLADDER WITH ASSISTANCE Description: STG Manage Bladder With Assistance Outcome: Progressing   Problem: RH SKIN INTEGRITY Goal: RH STG SKIN FREE OF INFECTION/BREAKDOWN Outcome: Progressing Goal: RH STG MAINTAIN SKIN INTEGRITY WITH ASSISTANCE Description: STG Maintain Skin Integrity With Assistance. Outcome: Progressing   Problem: RH SAFETY Goal: RH STG ADHERE TO SAFETY PRECAUTIONS W/ASSISTANCE/DEVICE Description: STG Adhere to Safety Precautions With Assistance/Device. Outcome: Progressing   Problem: RH PAIN MANAGEMENT Goal: RH STG PAIN MANAGED AT OR BELOW PT'S PAIN GOAL Outcome: Progressing   Problem: RH KNOWLEDGE DEFICIT GENERAL Goal: RH STG INCREASE KNOWLEDGE OF SELF CARE AFTER HOSPITALIZATION Outcome: Progressing

## 2019-04-01 NOTE — Evaluation (Signed)
Physical Therapy Assessment and Plan  Patient Details  Name: Virginia Thompson MRN: [id number] Date of Birth: 1929/01/05  PT Diagnosis: Abnormality of gait, Difficulty walking, Muscle weakness and Pain in L hip  Rehab Potential: Good ELOS: 14-18 days   Today's Date: 04/01/2019 PT Individual Time: 4098-1191 PT Individual Time Calculation (min): 45 min  and Today's Date: 04/01/2019 PT Missed Time: 15 Minutes Missed Time Reason: Patient fatigue   Problem List:  Patient Active Problem List   Diagnosis Date Noted  . Acute on chronic diastolic (congestive) heart failure ([place])   . Atrial fibrillation with RVR ([place])   . Chronic pain syndrome   . Hypoalbuminemia due to protein-calorie malnutrition ([place])   . OSA (obstructive sleep apnea)   . Slow transit constipation   . Supplemental oxygen dependent   . Debility 03/31/2019  . Physical debility 03/31/2019  . Aspiration pneumonia ([place]) 03/27/2019  . Acute on chronic respiratory failure with hypoxia and hypercapnia ([place]) 03/24/2019  . Hypertension   . Hypotension   . Obstructive sleep apnea on CPAP 03/13/2019  . Chronic pain after cancer treatment 03/13/2019  . At risk for adverse drug event 03/13/2019  . Acute blood loss anemia 03/13/2019  . History of lymphoma 03/13/2019  . History of ovarian cancer 03/13/2019  . Mood disorder ([place]) 03/13/2019  . Closed fracture of left femur, unspecified fracture morphology, sequela   . Surgery, elective   . Chronic bronchitis ([place])   . Acute on chronic diastolic CHF (congestive heart failure) ([place]) 03/07/2019  . 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 Closed left hip fracture, initial encounter ([place]) 03/06/2019    Past Medical History:  Past Medical History:  Diagnosis Date  . Blood transfusion without reported diagnosis   . Cancer ([place])   . CHF (congestive heart failure) ([place])   . Hypertension   . Hypertension   . Lymphoma ([place])   . Sleep apnea    Past Surgical History:  Past Surgical  History:  Procedure Laterality Date  . ABDOMINAL HYSTERECTOMY    . APPENDECTOMY    . BACK SURGERY    . BREAST CYST ASPIRATION Bilateral   . CHOLECYSTECTOMY    . EYE SURGERY    . INTRAMEDULLARY (IM) NAIL INTERTROCHANTERIC Left 03/07/2019   Procedure: INTRAMEDULLARY (IM) NAIL INTERTROCHANTRIC;  Surgeon: Altamese Coleman, MD;  Location: [facility];  Service: Orthopedics;  Laterality: Left;  . JOINT REPLACEMENT Left   . SPLENECTOMY    . TONSILLECTOMY      Assessment & Plan Clinical Impression: Patient is a 86 y/o female with PMH of COPD and OSA on 2L [place] at baseline (CPAP qhs), heart failure, ovarian cancer, hypertension and autoimmune hemolytic anemia (on chronic prednisone) with pathological hip fracture on 1/7 s/p IM nail per Dr. Marcelino Scot on 1/8.  Pt was initially discharged to SNF for rehab, and while at SNF developed worsening respiratory failure and EMS was called.  On EMS on arrival, pt noted to be hypoxic with saturations in 60s.  In ED on 1/25, pt initially febrile, hypotensive and tachycardic.  Workup revealed pulmonary edema vs pneumonia.  Pt with worsening mental status, requiring intubation in ED.  Pt started on levophed for tight BP control.  Pt was extubated on 1/26 and transferred out of ICU on 1/28.  Hospital course further complicated with Afib with RVR, returned to NSR when home metoprolol started.  Over the weekend prior to admission pt with episodes of desaturations into the upper 70s, requiring increased O2 and medication adjustments.  Therapy evaluations were completed and pt was recommended for CIR. Patient transferred to CIR on 03/31/2019 .   Patient currently requires max with mobility secondary to muscle weakness and muscle joint tightness, decreased cardiorespiratoy endurance and decreased oxygen support and decreased sitting balance, decreased standing balance and decreased balance strategies.  Prior to hospitalization, patient was supervision with mobility and lived with Daughter in a  House home.  Home access is  Ramped entrance.  Patient will benefit from skilled PT intervention to maximize safe functional mobility, minimize fall risk and decrease caregiver burden for planned discharge home with 24 hour supervision.  Anticipate patient will benefit from follow up [place] at discharge.  PT - End of Session Activity Tolerance: Tolerates < 10 min activity, no significant change in vital signs Endurance Deficit: Yes Endurance Deficit Description: decreased, requires increased O2 than baseline for all mobility PT Assessment Rehab Potential (ACUTE/IP ONLY): Good PT Barriers to Discharge: Medical stability;Incontinence;Wound Care;Other (comments) PT Barriers to Discharge Comments: increased O2 demands PT Patient demonstrates impairments in the following area(s): Balance;Behavior;Edema;Endurance;Motor;Safety PT Transfers Functional Problem(s): Bed Mobility;Bed to Chair;Furniture;Car;Floor PT Locomotion Functional Problem(s): Wheelchair Mobility;Ambulation;Stairs PT Plan PT Intensity: Minimum of 1-2 x/day ,45 to 90 minutes PT Frequency: 5 out of 7 days PT Duration Estimated Length of Stay: 14-18 days PT Treatment/Interventions: Ambulation/gait training;Discharge planning;Functional mobility training;Psychosocial support;Therapeutic Activities;Wheelchair propulsion/positioning;Therapeutic Exercise;Skin care/wound management;Neuromuscular re-education;Disease management/prevention;Balance/vestibular training;DME/adaptive equipment instruction;Pain management;Splinting/orthotics;UE/LE Strength taining/ROM;UE/LE Coordination activities;Stair training;Patient/family education;Community reintegration PT Transfers Anticipated Outcome(s): supervision PT Locomotion Anticipated Outcome(s): supervision short distance gait w/ LRAD PT Recommendation Follow Up Recommendations: Home health PT Patient destination: Home Equipment Recommended: To be determined  Skilled Therapeutic Intervention  Pt  received in supine, agreeable to therapy and denies pain at rest. Pt's brief and sheets noted to be soiled of urine, NT present to help w/ pericare and bed sheet management. R/L rolling w/ max assist to don new brief and for pericare. Brief rest break in supine before performing supine>sit w/ max assist. Pt needed most physical assist w/ scooting forward at EOB to get feet in floor. Moderate increase in work of breathing w/ all upright mobility, pt on 3 L/min O2 and spO2 >95%. Sit>stand w/ mod assist and min assist stand pivot to w/c w/ RW. Step-by-step cues for step placement and actually clearing R foot from ground 2/2 fear of LLE WB. Pt w/ mild increase in pain on L hip in WB, did not rate and it resolves w/ rest. Instructed pt in results of PT evaluation as detailed below, PT POC, rehab potential, rehab goals, and discharge recommendations. Pt requesting to return to supine 2/2 fatigue, reports she didn't sleep previous night. Stand pivot to EOB w/ min-mod assist and max assist sit>supine. Pt continued w/ labored breathing 5+ min of reclined supine rest, O2 turned up to 4 L/min for comfort w/ pt reporting improvement in SOB. RN made aware. Also provided w/ ice to L lateral hip for soreness and swelling relief. Ended session in supine, all needs in reach. Missed 15 min of skilled PT 2/2 fatigue.   PT Evaluation Precautions/Restrictions Precautions Precautions: Fall Restrictions Weight Bearing Restrictions: Yes LLE Weight Bearing: Weight bearing as tolerated General PT Amount of Missed Time (min): 15 Minutes PT Missed Treatment Reason: Patient fatigue Vital SignsTherapy Vitals Temp: 97.8 F (36.6 C) Temp Source: Oral Pulse Rate: 87 Resp: 18 BP: (!) 90/40 Patient Position (if appropriate): Lying Oxygen Therapy SpO2: 97 % O2 Device: Nasal Cannula O2 Flow Rate (L/min): 2 L/min Pain Pain Assessment  Pain Scale: 0-10 Pain Score: 0-No pain Pain Intervention(s): Repositioned Home Living/Prior  Functioning Home Living Available Help at Discharge: Family;Available 24 hours/day Type of Home: House Home Access: Ramped entrance Home Layout: One level  Lives With: Daughter Prior Function Level of Independence: Needs assistance with ADLs;Independent with gait;Independent with transfers;Requires assistive device for independence;Needs assistance with homemaking(needed assist w/ IADLs and some ADLs)  Able to Take Stairs?: Yes Driving: No Comments: likes to crochet and work on Engineer, materials Light Touch: Appears Intact Coordination Gross Motor Movements are Fluid and Coordinated: No Coordination and Movement Description: Impaired globally 2/2 weakness and deconditioning, some pain guarding Motor  Motor Motor: Within Functional Limits Motor - Skilled Clinical Observations: generalized weakness  Mobility Bed Mobility Bed Mobility: Rolling Right;Rolling Left;Supine to Sit;Sit to Supine Rolling Right: Maximal Assistance - Patient 25-49% Rolling Left: Maximal Assistance - Patient 25-49% Supine to Sit: Maximal Assistance - Patient - Patient 25-49% Sit to Supine: Maximal Assistance - Patient 25-49% Transfers Transfers: Sit to Stand;Stand Pivot Transfers;Stand to Sit Sit to Stand: Moderate Assistance - Patient 50-74% Stand to Sit: Moderate Assistance - Patient 50-74% Stand Pivot Transfers: Minimal Assistance - Patient > 75% Stand Pivot Transfer Details: Manual facilitation for weight shifting;Verbal cues for safe use of DME/AE;Verbal cues for technique;Verbal cues for sequencing;Tactile cues for sequencing;Tactile cues for posture Transfer (Assistive device): Rolling walker Locomotion  Gait Ambulation: Yes Gait Assistance: Minimal Assistance - Patient > 75% Gait Distance (Feet): 3 Feet Assistive device: Rolling walker Gait Assistance Details: Verbal cues for safe use of DME/AE;Manual facilitation for weight shifting Gait Gait: (antalgic gait pattern) Gait velocity:  decreased Stairs / Additional Locomotion Stairs: No Wheelchair Mobility Wheelchair Mobility: No  Trunk/Postural Assessment  Cervical Assessment Cervical Assessment: Exceptions to WFL(forward head) Thoracic Assessment Thoracic Assessment: Exceptions to WFL(forward head) Lumbar Assessment Lumbar Assessment: Exceptions to WFL(posterior pelvic tilt) Postural Control Postural Control: Deficits on evaluation(posterior lean bias)  Balance Balance Balance Assessed: Yes Static Sitting Balance Static Sitting - Level of Assistance: 5: Stand by assistance Dynamic Sitting Balance Dynamic Sitting - Level of Assistance: 4: Min assist Static Standing Balance Static Standing - Level of Assistance: 4: Min assist Dynamic Standing Balance Dynamic Standing - Level of Assistance: 4: Min assist Extremity Assessment  RLE Assessment RLE Assessment: Exceptions to [facility] Passive Range of Motion (PROM) Comments: Global stiffness General Strength Comments: Globally 4- to 4/5 LLE Assessment LLE Assessment: Exceptions to [facility] Passive Range of Motion (PROM) Comments: Unable to go past neutral hip extension, knee ROM limited to ~75-90 deg knee flexion, ankle WNL General Strength Comments: Globally 3/5    Refer to Care Plan for Long Term Goals  Recommendations for other services: None   Discharge Criteria: Patient will be discharged from PT if patient refuses treatment 3 consecutive times without medical reason, if treatment goals not met, if there is a change in medical status, if patient makes no progress towards goals or if patient is discharged from hospital.  The above assessment, treatment plan, treatment alternatives and goals were discussed and mutually agreed upon: by patient  Fallynn Gravett K Ovid Witman 04/01/2019, 10:23 AM

## 2019-04-01 NOTE — Progress Notes (Addendum)
Pt called staff stating that she was having trouble breathing through her CPAP. o2 sat= 60%-70%. CPAP removed; o2 @ 2L [place] w/ 02 sat=90-95%. VSS. Lungs sounds=crackles, wheezing. RT Shaunda called. RT came to room and put pt on CPAP at 5L w/ pt now sat=95+%. No s/s of distress. No c/o dyspnea. Charge nurse aware. Will continue to monitor.  Pt slept well rest of shift with no further difficulty with desat. O2 sat remained in 90-95% with CPAP. Pt was repositioned for comfort with no complaint. Bed in lowest position with call bell within reach.Bed alarm on and functioning properly.

## 2019-04-01 NOTE — Progress Notes (Signed)
Occupational Therapy Session Note  Patient Details  Name: Virginia Thompson MRN: [id number] Date of Birth: 1929-01-28  Today's Date: 04/01/2019 OT Individual Time: 1440-1505 OT Individual Time Calculation (min): 25 min    Short Term Goals: Week 1:  OT Short Term Goal 1 (Week 1): Pt will complete bathing with min assist at sit > stand level OT Short Term Goal 2 (Week 1): Pt will complete LB dressing with mod assist (including socks/shoes) OT Short Term Goal 3 (Week 1): Pt will complete toilet transfer with Min assist OT Short Term Goal 4 (Week 1): Pt will complete 1 grooming task in standing to demonstrate increased standing tolerance  Skilled Therapeutic Interventions/Progress Updates:  Balance/vestibular training;Discharge planning;Disease mangement/prevention;DME/adaptive equipment instruction;Functional mobility training;Pain management;Patient/family education;Psychosocial support;Self Care/advanced ADL retraining;Skin care/wound managment;Therapeutic Activities;Therapeutic Exercise;UE/LE Strength taining/ROM   1:1 Pt asleep in bed when arrived. Pt came to EOB with mod A for management of left Le and trunk to come up into sitting position. Pt on 2 liters of O2 in session. Pt had been incontient of urine but pt did not know. Hygiene to cleanse periarea and change brief and don clean pants with max A . Pt able to stand without UE support to pull up pants. Pt transferred into w/c with min A. Practiced propelling w/c with min  A for ~50 feet. REturned to room with snack and left up for breathing treatment.   Therapy Documentation Precautions:  Precautions Precautions: Fall Restrictions Weight Bearing Restrictions: Yes LLE Weight Bearing: Weight bearing as tolerated General:   Vital Signs: Therapy Vitals Temp: 97.8 F (36.6 C) Temp Source: Oral Pulse Rate: 73 Resp: (!) 24 BP: (!) 122/52 Patient Position (if appropriate): Sitting Oxygen Therapy SpO2: 100 % O2 Device: Nasal  Cannula O2 Flow Rate (L/min): 2 L/min Pain:  no c/o pain in session  ADL: ADL Grooming: Minimal assistance Where Assessed-Grooming: Sitting at sink Upper Body Bathing: Minimal assistance Where Assessed-Upper Body Bathing: Sitting at sink Lower Body Bathing: Maximal assistance Where Assessed-Lower Body Bathing: Sitting at sink, Standing at sink Upper Body Dressing: Minimal assistance Where Assessed-Upper Body Dressing: Sitting at sink Lower Body Dressing: Maximal assistance Where Assessed-Lower Body Dressing: Sitting at sink, Standing at sink Toileting: Dependent Where Assessed-Toileting: Other (Comment)(EOB/incontinent) Toilet Transfer: Moderate assistance Toilet Transfer Method: Stand pivot Toilet Transfer Equipment: Bedside commode   Therapy/Group: Individual Therapy  Willeen Cass [facility] 04/01/2019, 4:32 PM

## 2019-04-01 NOTE — Evaluation (Signed)
Occupational Therapy Assessment and Plan  Patient Details  Name: Virginia Thompson MRN: [id number] Date of Birth: [birth year]  OT Diagnosis: acute pain and muscle weakness (generalized) Rehab Potential: Rehab Potential (ACUTE ONLY): Good ELOS: 14-18 days   Today's Date: 04/01/2019 OT Individual Time: 1000-1100 OT Individual Time Calculation (min): 60 min     Problem List:  Patient Active Problem List   Diagnosis Date Noted  . Acute on chronic diastolic (congestive) heart failure ([place])   . Atrial fibrillation with RVR ([place])   . Chronic pain syndrome   . Hypoalbuminemia due to protein-calorie malnutrition ([place])   . OSA (obstructive sleep apnea)   . Slow transit constipation   . Supplemental oxygen dependent   . Debility 03/31/2019  . Physical debility 03/31/2019  . Aspiration pneumonia ([place]) 03/27/2019  . Acute on chronic respiratory failure with hypoxia and hypercapnia ([place]) 03/24/2019  . Hypertension   . Hypotension   . Obstructive sleep apnea on CPAP 03/13/2019  . Chronic pain after cancer treatment 03/13/2019  . At risk for adverse drug event 03/13/2019  . Acute blood loss anemia 03/13/2019  . History of lymphoma 03/13/2019  . History of ovarian cancer 03/13/2019  . Mood disorder ([place]) 03/13/2019  . Closed fracture of left femur, unspecified fracture morphology, sequela   . Surgery, elective   . Chronic bronchitis ([place])   . Acute on chronic diastolic CHF (congestive heart failure) ([place]) 03/07/2019  . 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 Closed left hip fracture, initial encounter ([place]) 03/06/2019    Past Medical History:  Past Medical History:  Diagnosis Date  . Blood transfusion without reported diagnosis   . Cancer ([place])   . CHF (congestive heart failure) ([place])   . Hypertension   . Hypertension   . Lymphoma ([place])   . Sleep apnea    Past Surgical History:  Past Surgical History:  Procedure Laterality Date  . ABDOMINAL HYSTERECTOMY    . APPENDECTOMY    .  BACK SURGERY    . BREAST CYST ASPIRATION Bilateral   . CHOLECYSTECTOMY    . EYE SURGERY    . INTRAMEDULLARY (IM) NAIL INTERTROCHANTERIC Left 03/07/2019   Procedure: INTRAMEDULLARY (IM) NAIL INTERTROCHANTRIC;  Surgeon: Altamese Chesapeake, MD;  Location: [facility];  Service: Orthopedics;  Laterality: Left;  . JOINT REPLACEMENT Left   . SPLENECTOMY    . TONSILLECTOMY      Assessment & Plan Clinical Impression: Patient is a 86 y.o. year old female with history of diastolic CHF--2 Loxygen dependent,OSA-CPAP,chronic bronchitis,HTN, lymphoma, autoimmune hemolytic anemia, admission 1/8 for left proximal femur fractures/pIM nailing by Dr.Handyand postop course significant for hypotension, ABLA s/p 2 units PRBC as well as acute on chronic respiratory failure. She was discharged to SNF on 1/14 but readmitted 03/24/2019 severe pain BLE, hypotension acute renal injury and shortness of breath. She was started on sepsis protocoldue to temperatureelevation and placed on BiPAP due to evidence of respiratory acidosis. Patient's mental status declined with acute on chronic hypoxic hypercapnic failure requiring intubation in EDand she receivedIV Lasixdue toelevated BNP signs of fluid overload and was started on stress dose steroids as well as IV antibiotics for aspiration pneumonia.Sherequired restraints due to bouts of agitation. Chest CT negative for PE and showed moderate BLL consolidation, moderate severity patchy bilateral upper lobe atelectasisand moderate bilateral pleural effusions. 2D echo done showing EF with aortic stenosis.   She was extubatedto 3L oxygen per [place] on01/26 felt that patient respiratory failure multifactorial due to opiates in setting of multiple  medical issues complicated by aspiration pneumonia. Swallow evaluation done showing no signs or symptoms of aspiration and dysphagia 3, thinsrecommended due to lack of patient's dentures. She developed A. fib with RVR on 01/29 requiring  IV digoxin and low-dose metoprolol added for rate control. Patient with history of falls and family was hesitant to start anticoagulation therefore recommendations made to follow up with primary cardiology for input past discharge.She did have worsening of respiratory status on 1/31 am with increase in oxygen needs. CXR showed interval worsening of bilateral perihilar opacities--edema v/s infection. She was treated with IV lasix for fluid overload and placed on 1500 cc FR. Also started on burst of steroid due to concerns of COPD exacerbation. Therapy ongoing and patient continues to be limited by pain, anxiety/fear and weakness affecting mobility and ADLs. CIR recommended due to functional decline.  Patient transferred to CIR on 03/31/2019 .    Patient currently requires mod-max with basic self-care skills secondary to muscle weakness, decreased cardiorespiratoy endurance and decreased oxygen support and decreased standing balance and decreased balance strategies.  Prior to hospitalization, patient could complete ADLs with Mod I to supervision.  Patient will benefit from skilled intervention to decrease level of assist with basic self-care skills prior to discharge home with care partner.  Anticipate patient will require 24 hour supervision and follow up home health.  OT - End of Session Activity Tolerance: Tolerates 30+ min activity with multiple rests Endurance Deficit: Yes Endurance Deficit Description: decreased, requires increased O2 than baseline for all mobility OT Assessment Rehab Potential (ACUTE ONLY): Good OT Barriers to Discharge: Incontinence OT Patient demonstrates impairments in the following area(s): Balance;Endurance;Motor;Pain;Safety OT Basic ADL's Functional Problem(s): Grooming;Bathing;Dressing;Toileting OT Transfers Functional Problem(s): Toilet;Tub/Shower OT Additional Impairment(s): None OT Plan OT Intensity: Minimum of 1-2 x/day, 45 to 90 minutes OT Frequency: 5 out  of 7 days OT Duration/Estimated Length of Stay: 14-18 days OT Treatment/Interventions: Balance/vestibular training;Discharge planning;Disease Lawyer;Functional mobility training;Pain management;Patient/family education;Psychosocial support;Self Care/advanced ADL retraining;Skin care/wound managment;Therapeutic Activities;Therapeutic Exercise;UE/LE Strength taining/ROM OT Basic Self-Care Anticipated Outcome(s): Supervision OT Toileting Anticipated Outcome(s): Supervision OT Bathroom Transfers Anticipated Outcome(s): Supervision OT Recommendation Patient destination: Home Follow Up Recommendations: Home health OT;24 hour supervision/assistance Equipment Recommended: To be determined   Skilled Therapeutic Intervention OT eval completed with discussion of rehab process, OT purpose, POC, ELOS, and goals.  ADL assessment completed at sinkside with focus on functional transfers, sit > stand, and overall endurance during self-care tasks.  Bathing and dressing completed at sit > stand requiring mod assist for sit > stand.  Pt required total assist for LB bathing and hygiene post toileting.  Pt able to thread RLE through pants but required assist to thread LLE and pull pants over hips.  Pt required frequent rest breaks throughout session due to DOE; O2 sats dropping to 85% on 2L during activity but able to recover to low 90s in 3-4 mins.  Pt reports fatigued throughout session.  Returned to bed at end of session, requiring max assist for bed mobility.   OT Evaluation Precautions/Restrictions  Precautions Precautions: Fall Restrictions Weight Bearing Restrictions: Yes LLE Weight Bearing: Weight bearing as tolerated Vital Signs Therapy Vitals Pulse Rate: 87 Resp: 18 BP: (!) 90/40 Patient Position (if appropriate): Lying Oxygen Therapy SpO2: 97 % O2 Device: Nasal Cannula O2 Flow Rate (L/min): 2 L/min Pain Pain Assessment Pain Scale: 0-10 Pain  Score: 4  Pain Type: Surgical pain Pain Location: Hip Pain Orientation: Left Pain Descriptors / Indicators: Aching Pain Onset: On-going Pain  Intervention(s): Repositioned;RN made aware Home Living/Prior [place] expects to be discharged to:: Unsure Available Help at Discharge: Family, Available 24 hours/day Type of Home: House Home Access: Ramped entrance Home Layout: One level Bathroom Shower/Tub: Chiropodist: Standard Additional Comments: has tub transfer bench, grab bars in shower, and hand held shower head, and 3 in 1  Lives With: Daughter IADL History Homemaking Responsibilities: No Prior Function Level of Independence: Needs assistance with ADLs, Independent with gait, Independent with transfers, Requires assistive device for independence, Needs assistance with homemaking  Able to Take Stairs?: Yes Driving: No Comments: likes to crochet and work on crafts ADL ADL Grooming: Minimal assistance Where Assessed-Grooming: Sitting at sink Upper Body Bathing: Minimal assistance Where Assessed-Upper Body Bathing: Sitting at sink Lower Body Bathing: Maximal assistance Where Assessed-Lower Body Bathing: Sitting at sink, Standing at sink Upper Body Dressing: Minimal assistance Where Assessed-Upper Body Dressing: Sitting at sink Lower Body Dressing: Maximal assistance Where Assessed-Lower Body Dressing: Sitting at sink, Standing at sink Toileting: Dependent Where Assessed-Toileting: Other (Comment)(EOB/incontinent) Toilet Transfer: Moderate assistance Toilet Transfer Method: Stand pivot Toilet Transfer Equipment: Bedside commode Vision Baseline Vision/History: No visual deficits(recent cataract surgery) Patient Visual Report: No change from baseline Vision Assessment?: No apparent visual deficits Perception  Perception: Within Functional Limits Praxis Praxis: Intact Cognition Overall Cognitive Status: Within Functional  Limits for tasks assessed Arousal/Alertness: Awake/alert Orientation Level: Person;Place;Situation Person: Oriented Place: Oriented Situation: Oriented Year: 2021 Month: February Day of Week: Incorrect(Monday) Memory: Appears intact Immediate Memory Recall: Sock;Blue;Bed Memory Recall Sock: Not able to recall Memory Recall Blue: Without Cue Memory Recall Bed: Without Cue Attention: Alternating;Selective Selective Attention: Appears intact Awareness: Appears intact Problem Solving: Appears intact Safety/Judgment: Appears intact Sensation Sensation Light Touch: Appears Intact Coordination Gross Motor Movements are Fluid and Coordinated: No Fine Motor Movements are Fluid and Coordinated: Yes Coordination and Movement Description: Impaired globally 2/2 weakness and deconditioning, some pain guarding Motor  Motor Motor: Within Functional Limits Motor - Skilled Clinical Observations: generalized weakness Mobility  Bed Mobility Bed Mobility: Rolling Right;Rolling Left;Supine to Sit;Sit to Supine Rolling Right: Maximal Assistance - Patient 25-49% Rolling Left: Maximal Assistance - Patient 25-49% Supine to Sit: Maximal Assistance - Patient - Patient 25-49% Sit to Supine: Maximal Assistance - Patient 25-49% Transfers Sit to Stand: Moderate Assistance - Patient 50-74% Stand to Sit: Moderate Assistance - Patient 50-74%  Trunk/Postural Assessment  Cervical Assessment Cervical Assessment: Exceptions to WFL(forward head) Thoracic Assessment Thoracic Assessment: Exceptions to WFL(forward head) Lumbar Assessment Lumbar Assessment: Exceptions to WFL(posterior pelvic tilt) Postural Control Postural Control: Deficits on evaluation(posterior lean bias)  Balance Balance Balance Assessed: Yes Static Sitting Balance Static Sitting - Level of Assistance: 5: Stand by assistance Dynamic Sitting Balance Dynamic Sitting - Level of Assistance: 4: Min assist Static Standing Balance Static  Standing - Level of Assistance: 4: Min assist Dynamic Standing Balance Dynamic Standing - Level of Assistance: 4: Min assist Extremity/Trunk Assessment RUE Assessment RUE Assessment: Within Functional Limits General Strength Comments: grossly 4+/5 LUE Assessment LUE Assessment: Within Functional Limits General Strength Comments: grossly 4+/5     Refer to Care Plan for Long Term Goals  Recommendations for other services: None    Discharge Criteria: Patient will be discharged from OT if patient refuses treatment 3 consecutive times without medical reason, if treatment goals not met, if there is a change in medical status, if patient makes no progress towards goals or if patient is discharged from hospital.  The above assessment, treatment plan, treatment  alternatives and goals were discussed and mutually agreed upon: by patient  Simonne Come 04/01/2019, 10:58 AM

## 2019-04-01 NOTE — Progress Notes (Signed)
[facility] PHYSICAL MEDICINE & RE[facility]ABILITATION PROGRESS NOTE  Subjective/Complaints: Patient seen sitting up in bed this morning eating breakfast.  She states she slept well overnight.  She states she is ready to begin therapies.  ROS: Denies CP, shortness of breath, nausea, vomiting, diarrhea.  Objective: Vital Signs: Blood pressure 125/65, pulse 80, temperature 97.8 F (36.6 C), temperature source Oral, resp. rate 19, weight 96.3 kg, SpO2 98 %. DG CHEST PORT 1 VIEW  Result Date: 03/30/2019 CLINICAL DATA:  Acute respiratory failure. EXAM: PORTABLE CHEST 1 VIEW COMPARISON:  03/28/2019 FINDINGS: Right-sided PICC line unchanged with tip just below the cavoatrial junction. Lungs are adequately inflated with interval worsening of bilateral perihilar airspace opacification which may be due to interstitial edema or infection. Stable left base/retrocardiac opacification likely effusion/atelectasis. Mild stable cardiomegaly. Remainder of the exam is unchanged. IMPRESSION: 1. Stable cardiomegaly with worsening bilateral perihilar airspace opacification likely moderate interstitial edema and less likely infection. Stable left base opacification likely effusion with atelectasis. 2.  Right-sided PICC line unchanged. Electronically Signed   By: Marin Olp M.D.   On: 03/30/2019 08:07   Recent Labs    03/31/19 0500 04/01/19 0500  WBC 6.3 7.0  HGB 8.5* 8.9*  HCT 29.8* 31.3*  PLT 313 330   Recent Labs    03/31/19 0500 04/01/19 0500  NA 139 140  K 3.9 3.6  CL 92* 89*  CO2 40* 42*  GLUCOSE 120* 90  BUN 13 11  CREATININE 0.70 0.62  CALCIUM 8.5* 8.4*    Physical Exam: BP 125/65 (BP Location: Left Arm)   Pulse 80   Temp 97.8 F (36.6 C) (Oral)   Resp 19   Wt 96.3 kg   SpO2 98%   BMI 36.44 kg/m  Constitutional: No distress . Vital signs reviewed.  Morbidly obese. HENT: Normocephalic.  Atraumatic. Eyes: EOMI. No discharge. Cardiovascular: No JVD. Respiratory: Normal effort.  No  stridor.  + [place]. GI: Non-distended. Skin: Warm and dry.  Intact. Psych: Normal mood.  Normal behavior. Musc: Left hip with tenderness Neuro: Alert Motor: Bilateral upper extremities: 5/5 proximal distal Left lower extremity: Hip flexion, knee extension 4/5, ankle dorsiflexion 5/5 Right lower extremity: Hip flexion 1+/5, ankle dorsiflexion 5/5  Assessment/Plan: 1. Functional deficits secondary to debility which require 3+ hours per day of interdisciplinary therapy in a comprehensive inpatient rehab setting.  Physiatrist is providing close team supervision and 24 hour management of active medical problems listed below.  Physiatrist and rehab team continue to assess barriers to discharge/monitor patient progress toward functional and medical goals  Care Tool:  Bathing              Bathing assist       Upper Body Dressing/Undressing Upper body dressing   What is the patient wearing?: Hospital gown only    Upper body assist      Lower Body Dressing/Undressing Lower body dressing            Lower body assist       Toileting Toileting    Toileting assist Assist for toileting: Maximal Assistance - Patient 25 - 49%     Transfers Chair/bed transfer  Transfers assist           Locomotion Ambulation   Ambulation assist              Walk 10 feet activity   Assist           Walk 50 feet activity   Assist  Walk 150 feet activity   Assist           Walk 10 feet on uneven surface  activity   Assist           Wheelchair     Assist               Wheelchair 50 feet with 2 turns activity    Assist            Wheelchair 150 feet activity     Assist            Medical Problem List and Plan: 1. Debility secondary to HCAP  Begin CIR evaluations  2. Antithrombotics: -DVT/anticoagulation:Pharmaceutical:Lovenox -antiplatelet therapy: N/A 3.Neuropathy/chornic pain/Pain  Management:Butrans patch daily. On cymbalta. Continue low dose oxycodone prn--monitor for sedation  Monitor with increased mobility 4. Mood:LCSW to follow for evaluation and support. -antipsychotic agents: N/A 5. Neuropsych: This patientiscapable of making decisions onherown behalf. 6. Skin/Wound Care:Monitor incision daily for healing 7. Fluids/Electrolytes/Nutrition:Offer supplement during the day.Advised patient to have family bring in food from home to help with intake. 8. Aspiration [id number] day course of cefepime on 1/31.  Transitioned to bactrim prophylaxis 9. PAF with RVR: Monitor HR --continue metoprolol and ASA.   ECG on 1/29 showing A. fib with RVR, ECG for this a.m. pending  Monitor with increased activity 10. Left femur fracture s/p IM nailing: WBAT- lovenox through 04/07/2019 11. History of Chronic Hypotension:Continue midodrinetid.  Monitor with increased mobility 12. Autoimmune hemolytic anemia: On chronic steroids and bactrim for prophylaxis  Hemoglobin 8.9 on 2/2  Will also order vitamin B12/folate with next labs 13. OSA: Needs to use CPAP at nights and whenever napping.  14.Obstructive lung disease/Chronic bronchitis:Continue prednisone, home dose of 5 mg of prednisone per day  Taper steroids to home dose  Supplemental oxygen dependent at baseline  Respiratory status stable on Dulera bid.   Schedule nebulizers 15.Acute onChronic diastolic CHF: Monitor for signs and symptoms of fluid overload.Now on 1500 cc FR.  Continue low dose metoprolol and lasix 60 mg daily (was on 40 mg bid PTA?). Heart healthy diet.  Filed Weights   04/01/19 0400  Weight: 96.3 kg  16. Left apical lung nodule: Follow up CT in 12 months recommended. 17. Constipation:   Adjust bowel program as necessary 18.  Hypoalbuminemia  Supplement initiated on 2/2  LOS: 1 days A FACE TO FACE EVALUATION WAS PERFORMED  Keilyn Nadal Lorie Phenix 04/01/2019, 7:58 AM

## 2019-04-02 ENCOUNTER — Inpatient Hospital Stay (HOSPITAL_COMMUNITY): Payer: Medicare Other | Admitting: Speech Pathology

## 2019-04-02 ENCOUNTER — Inpatient Hospital Stay (HOSPITAL_COMMUNITY): Payer: Medicare Other

## 2019-04-02 ENCOUNTER — Inpatient Hospital Stay (HOSPITAL_COMMUNITY): Payer: Medicare Other | Admitting: Occupational Therapy

## 2019-04-02 DIAGNOSIS — R159 Full incontinence of feces: Secondary | ICD-10-CM

## 2019-04-02 DIAGNOSIS — R32 Unspecified urinary incontinence: Secondary | ICD-10-CM

## 2019-04-02 DIAGNOSIS — I480 Paroxysmal atrial fibrillation: Secondary | ICD-10-CM

## 2019-04-02 LAB — BLOOD GAS, ARTERIAL
Acid-Base Excess: 19.7 mmol/L — ABNORMAL HIGH (ref 0.0–2.0)
Bicarbonate: 46.4 mmol/L — ABNORMAL HIGH (ref 20.0–28.0)
Drawn by: 548871
FIO2: 32
O2 Saturation: 91.1 %
Patient temperature: 37
pCO2 arterial: 86.9 mmHg (ref 32.0–48.0)
pH, Arterial: 7.347 — ABNORMAL LOW (ref 7.350–7.450)
pO2, Arterial: 64.3 mmHg — ABNORMAL LOW (ref 83.0–108.0)

## 2019-04-02 LAB — PROCALCITONIN: Procalcitonin: <0.1 ng/mL

## 2019-04-02 LAB — GLUCOSE, CAPILLARY
Glucose-Capillary: 131 mg/dL — ABNORMAL HIGH (ref 70–99)
Glucose-Capillary: 149 mg/dL — ABNORMAL HIGH (ref 70–99)
Glucose-Capillary: 116 mg/dL — ABNORMAL HIGH (ref 70–99)

## 2019-04-02 MED ORDER — INSULIN ASPART 100 UNIT/ML ~~LOC~~ SOLN
0.0000 [IU] | Freq: Three times a day (TID) | SUBCUTANEOUS | Status: DC
  Administered 2019-04-02 (×2): 1 [IU] via SUBCUTANEOUS
  Administered 2019-04-03 – 2019-04-04 (×3): 2 [IU] via SUBCUTANEOUS
  Administered 2019-04-05 (×2): 1 [IU] via SUBCUTANEOUS

## 2019-04-02 MED ORDER — SULFAMETHOXAZOLE-TRIMETHOPRIM 800-160 MG PO TABS
1.0000 | ORAL_TABLET | ORAL | Status: DC
  Administered 2019-04-02: 13:00:00 1 via ORAL
  Filled 2019-04-02: qty 1

## 2019-04-02 MED ORDER — FUROSEMIDE 10 MG/ML IJ SOLN
40.0000 mg | Freq: Once | INTRAMUSCULAR | Status: AC
  Administered 2019-04-02: 16:00:00 40 mg via INTRAVENOUS
  Filled 2019-04-02: qty 4

## 2019-04-02 MED ORDER — LEVALBUTEROL HCL 0.63 MG/3ML IN NEBU
0.6300 mg | INHALATION_SOLUTION | Freq: Four times a day (QID) | RESPIRATORY_TRACT | Status: DC
  Administered 2019-04-02 – 2019-04-05 (×14): 0.63 mg via RESPIRATORY_TRACT
  Filled 2019-04-02 (×12): qty 3

## 2019-04-02 MED ORDER — ACETAZOLAMIDE 250 MG PO TABS
500.0000 mg | ORAL_TABLET | Freq: Once | ORAL | Status: AC
  Administered 2019-04-02: 500 mg via ORAL
  Filled 2019-04-02: qty 2

## 2019-04-02 NOTE — Consult Note (Signed)
NAME:  Virginia Thompson, MRN:  [id number], DOB:  [birth year], LOS: 2 ADMISSION DATE:  03/31/2019, CONSULTATION DATE:  04/02/19 REFERRING MD:  Posey Pronto - PMR, CHIEF COMPLAINT:  Debility, hypoxia  Brief History   86yo F recently admitted to [place] after hospitalization for acute respiratory failure requiring intubation in setting of debility following hip fx.   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 Was admitted to [facility] 1/25 for resp failure, intubated, extubated 1/26, and remained on hospitalist service until 2/1 discharge to [place].   2/3 PCCM engaged for CXR findings: increased interstitial and airspace opacities.   Past Medical History  OSA Chronic bronchitis CHF HTN Lymphoma Autoimmune hemolytic anemia  Significant Hospital Events     Consults:  PCCM  Procedures:    Significant Diagnostic Tests:  CXR 2/3> interstitial and airspace opacities   Micro Data:    Antimicrobials:  Cefepime 7 day course, end 1/31 Bactrim M/W/F 2/1>   Interim history/subjective:  Seated in wheelchair watching TV, NAD  Objective   Blood pressure (!) 112/54, pulse 76, temperature 98 F (36.7 C), temperature source Oral, resp. rate 19, weight 92.3 kg, SpO2 94 %.        Intake/Output Summary (Last 24 hours) at 04/02/2019 1505 Last data filed at 04/02/2019 1300 Gross per 24 hour  Intake 220 ml  Output --  Net 220 ml   Filed Weights   04/01/19 0400 04/02/19 0500  Weight: 96.3 kg 92.3 kg    Examination: General: Debilitated elderly febale, seated in wheelchair NAD HENT: NCAT pink mmm trachea midline Lungs: Crackles throughout. Symmetrical chest expansion, no accessory muscle use Cardiovascular: RRR s1s2 Cap refill < 3 sec Abdomen: Soft round  ndnt Extremities: Non-pitting edema BLE Neuro: Awake, alert, pleasant and calm GU: incontinence brief  Resolved Hospital Problem list     Assessment & Plan:   Acute on chronic respiratory insufficiency with hypercarbia and hypoxia  Hx baseline 2-3L O2 use, OSA on CPAP, Chronic bronchitis, Aspiration -ABG with hypercarbia  -CXR read as multifocal interstitial and airspace opacities -Suspect degree of atelectasis, pulm edema  -No leukocytosis, no fever, no productive cough, no chills P -CPAP qHS, PRN -IS, encourage mobility, flutter -Recommend diuresis; I will order 1x 40 Lasix IV 04/02/19 -check PCT  -is on bactrim MWF for ppx following 7 day course of cefepime for aspiration PNA. Do not think we need to expand abx at this time  -PRN CXR  -Continue incruse, xopenex, duoneb   Best practice:  Diet: Dys 3 Pain/Anxiety/Delirium protocol (if indicated): APAP, Oxycodone VAP protocol (if indicated): na DVT prophylaxis: lovenox  GI prophylaxis: protonix Glucose control: ssi Mobility: PT Code Status: Full  Family Communication: Patient updated  Disposition: CPIS   Labs   CBC: Recent Labs  Lab 03/27/19 0352 03/28/19 0530 03/31/19 0500 04/01/19 0500  WBC 10.4 6.8 6.3 7.0  NEUTROABS  --   --   --  4.5  HGB 8.6* 9.2* 8.5* 8.9*  HCT 28.8* 31.7* 29.8* 31.3*  MCV 104.0* 103.9* 107.2* 107.2*  PLT 299 299 313 [id number]    Basic Metabolic Panel: Recent Labs  Lab 03/27/19 0352 03/29/19 0306 03/30/19 0444 03/31/19 0500 03/31/19 1832 04/01/19 0500  NA 138 138 139 139  --  140  K 3.7 3.4* 3.7 3.9  --  3.6  CL 94* 93* 93* 92*  --  89*  CO2 36* 39* 37* 40*  --  42*  GLUCOSE 109* 100* 105* 120*  --  90  BUN 32* 15 9 13   --  11  CREATININE 0.75 0.48 0.56 0.70  --  0.62  CALCIUM 8.1* 8.3* 8.3* 8.5*  --  8.4*  MG  --  2.3 2.3  --  2.2  --    GFR: Estimated Creatinine Clearance: 53.5 mL/min (by C-G formula based on SCr of 0.62 mg/dL). Recent Labs  Lab 03/27/19 0352  03/28/19 0530 03/31/19 0500 04/01/19 0500  WBC 10.4 6.8 6.3 7.0    Liver Function Tests: Recent Labs  Lab 04/01/19 0500  AST 28  ALT 35  ALKPHOS 92  BILITOT 0.4  PROT 5.7*  ALBUMIN 2.7*   No results for input(s): LIPASE, AMYLASE in the last 168 hours. No results for input(s): AMMONIA in the last 168 hours.  ABG    Component Value Date/Time   PHART 7.347 (L) 04/02/2019 1207   PCO2ART 86.9 (HH) 04/02/2019 1207   PO2ART 64.3 (L) 04/02/2019 1207   HCO3 46.4 (H) 04/02/2019 1207   TCO2 43 (H) 03/24/2019 0541   O2SAT 91.1 04/02/2019 1207     Coagulation Profile: No results for input(s): INR, PROTIME in the last 168 hours.  Cardiac Enzymes: No results for input(s): CKTOTAL, CKMB, CKMBINDEX, TROPONINI in the last 168 hours.  HbA1C: No results found for: HGBA1C  CBG: Recent Labs  Lab 03/27/19 2036 03/28/19 0049 03/31/19 2104 04/01/19 0546 04/02/19 1224  GLUCAP 106* 97 128* 67* 131*    Review of Systems:   Denies SOB, denies Cough, denies URI symptoms  Denies chest pain, denies palpitations, denies feeling of impending doom  Denies nvdc   Past Medical History  She,  has a past medical history of Blood transfusion without reported diagnosis, Cancer ([place]), CHF (congestive heart failure) ([place]), Hypertension, Hypertension, Lymphoma ([place]), and Sleep apnea.   Surgical History    Past Surgical History:  Procedure Laterality Date  . ABDOMINAL HYSTERECTOMY    . APPENDECTOMY    . BACK SURGERY    . BREAST CYST ASPIRATION Bilateral   . CHOLECYSTECTOMY    . EYE SURGERY    . INTRAMEDULLARY (IM) NAIL INTERTROCHANTERIC Left 03/07/2019   Procedure: INTRAMEDULLARY (IM) NAIL INTERTROCHANTRIC;  Surgeon: Altamese Yuba, MD;  Location: [facility];  Service: Orthopedics;  Laterality: Left;  . JOINT REPLACEMENT Left   . SPLENECTOMY    . TONSILLECTOMY       Social History   reports that she quit smoking about 15 years ago. Her smoking use included cigarettes. She has never used  smokeless tobacco. She reports that she does not drink alcohol or use drugs.   Family History   Her family history is negative for Breast cancer.   Allergies Allergies  Allergen Reactions  . Aleve [Naproxen] Rash  . Mobic [Meloxicam] Rash  . Tape Rash     Home Medications  Prior to Admission medications   Medication Sig Start Date End Date Taking? Authorizing Provider  acetaminophen (TYLENOL) 500 MG tablet Take 500 mg by mouth every 6 (six) hours as needed for mild pain.  05/15/11   [provider]  albuterol (PROAIR HFA) 108 (90 Base) MCG/ACT inhaler Inhale 2 puffs into the lungs every 6 (six) hours as needed for wheezing or shortness of breath.  04/12/17   [provider]  aspirin EC 81 MG tablet Take 81 mg by mouth daily.     [provider]  buprenorphine Haze Rushing) 5 MCG/HR PTWK Place 1 patch onto the skin every Tuesday.     [provider]  docusate sodium (COLACE) 100 MG capsule Take 100 mg by mouth daily.     [provider]  DULoxetine (CYMBALTA) 60 MG capsule Take 60 mg by mouth daily.  03/15/17   [provider]  enoxaparin (LOVENOX) 40 MG/0.4ML injection Inject 0.4 mLs (40 mg total) into the skin daily. 03/13/19 04/12/19  Caren Griffins, MD  esomeprazole (NEXIUM) 40 MG capsule Take 40 mg by mouth daily.  01/16/18   [provider]  folic acid (FOLVITE) 1 MG tablet Take 1 mg by mouth 3 (three) times daily.  08/01/17   [provider]  furosemide (LASIX) 40 MG tablet Take 60 mg by mouth daily.  11/28/17   [provider]  loratadine (CLARITIN) 10 MG tablet Take 10 mg by mouth daily.    [provider]  metoprolol tartrate (LOPRESSOR) 25 MG tablet Take 12.5 mg by mouth 2 (two) times daily.  08/01/17   [provider]  midodrine (PROAMATINE) 10 MG tablet Take 1 tablet (10 mg total) by mouth 3 (three) times daily with meals. 03/13/19   Caren Griffins, MD  Multiple Vitamins-Minerals (ICAPS  AREDS 2 PO) Take 2 tablets by mouth daily.    [provider]  oxyCODONE (OXY IR/ROXICODONE) 5 MG immediate release tablet Take 1 tablet (5 mg total) by mouth every 6 (six) hours as needed for severe pain. 03/21/19   Medina-Vargas, Monina C, NP  OXYGEN Inhale 2 L into the lungs.    [provider]  pramipexole (MIRAPEX) 1 MG tablet Take 1 mg by mouth at bedtime.  02/05/18   [provider]  predniSONE (DELTASONE) 5 MG tablet Take 5 mg by mouth daily.  12/25/17   [provider]  predniSONE (DELTASONE) 50 MG tablet Take 1 tablet (50 mg total) by mouth daily with breakfast for 2 days. 03/31/19 04/02/19  Dessa Phi, DO  senna-docusate (SENOKOT-S) 8.6-50 MG tablet Take 2 tablets by mouth 2 (two) times daily.    [provider]  sulfamethoxazole-trimethoprim (BACTRIM DS,SEPTRA DS) 800-160 MG tablet Take 1 tablet by mouth every Monday, Wednesday, and Friday.  12/24/17   [provider]  tiotropium (SPIRIVA HANDIHALER) 18 MCG inhalation capsule Place 18 mcg into inhaler and inhale daily.  04/12/17   [provider]     Eliseo Gum MSN, AGACNP-BC [place] [id number] If no answer, [id number] 04/02/2019, 3:05 PM

## 2019-04-02 NOTE — Progress Notes (Addendum)
[facility] PHYSICAL MEDICINE & REHABILITATION PROGRESS NOTE  Subjective/Complaints: Patient seen sitting up in bed this AM.  She states she slept well overnight.  She states she had a good first day of therapies yesterday. Desats noted overnight with CPAP.  ROS: Denies CP, shortness of breath, nausea, vomiting, diarrhea.  Objective: Vital Signs: Blood pressure 121/78, pulse 96, temperature 99.1 F (37.3 C), temperature source Oral, resp. rate 20, weight 92.3 kg, SpO2 92 %. No results found. Recent Labs    03/31/19 0500 04/01/19 0500  WBC 6.3 7.0  HGB 8.5* 8.9*  HCT 29.8* 31.3*  PLT 313 330   Recent Labs    03/31/19 0500 04/01/19 0500  NA 139 140  K 3.9 3.6  CL 92* 89*  CO2 40* 42*  GLUCOSE 120* 90  BUN 13 11  CREATININE 0.70 0.62  CALCIUM 8.5* 8.4*    Physical Exam: BP 121/78   Pulse 96   Temp 99.1 F (37.3 C) (Oral)   Resp 20   Wt 92.3 kg   SpO2 92%   BMI 34.93 kg/m  Constitutional: No distress . Vital signs reviewed. Morbidly obese.  HENT: Normocephalic.  Atraumatic. Eyes: EOMI. No discharge. Cardiovascular: No JVD. Respiratory: Normal effort.  No stridor. +[place].  GI: Non-distended. Skin: Warm and dry.  Intact. Psych: Normal mood.  Normal behavior. Musc: Left hip with tenderness Neuro: Alert Motor: Bilateral upper extremities: 5/5 proximal distal Left lower extremity: Hip flexion, knee extension 4-/5, ankle dorsiflexion 5/5 Right lower extremity: Hip flexion, knee extension 4-/5, ankle dorsiflexion 5/5  Assessment/Plan: 1. Functional deficits secondary to debility which require 3+ hours per day of interdisciplinary therapy in a comprehensive inpatient rehab setting.  Physiatrist is providing close team supervision and 24 hour management of active medical problems listed below.  Physiatrist and rehab team continue to assess barriers to discharge/monitor patient progress toward functional and medical goals  Care Tool:  Bathing    Body parts bathed  by patient: Right arm, Left arm, Chest, Abdomen, Face   Body parts bathed by helper: Front perineal area, Buttocks, Right upper leg, Left upper leg, Right lower leg, Left lower leg     Bathing assist Assist Level: Moderate Assistance - Patient 50 - 74%     Upper Body Dressing/Undressing Upper body dressing   What is the patient wearing?: Pull over shirt    Upper body assist Assist Level: Minimal Assistance - Patient > 75%    Lower Body Dressing/Undressing Lower body dressing      What is the patient wearing?: Pants, Incontinence brief     Lower body assist Assist for lower body dressing: Maximal Assistance - Patient 25 - 49%     Toileting Toileting    Toileting assist Assist for toileting: Total Assistance - Patient < 25%     Transfers Chair/bed transfer  Transfers assist     Chair/bed transfer assist level: Moderate Assistance - Patient 50 - 74%     Locomotion Ambulation   Ambulation assist      Assist level: Minimal Assistance - Patient > 75% Assistive device: Walker-rolling Max distance: 3'   Walk 10 feet activity   Assist  Walk 10 feet activity did not occur: Safety/medical concerns        Walk 50 feet activity   Assist Walk 50 feet with 2 turns activity did not occur: Safety/medical concerns         Walk 150 feet activity   Assist Walk 150 feet activity did not occur: Safety/medical  concerns         Walk 10 feet on uneven surface  activity   Assist Walk 10 feet on uneven surfaces activity did not occur: Safety/medical concerns         Wheelchair     Assist Will patient use wheelchair at discharge?: (TBD)   Wheelchair activity did not occur: Safety/medical concerns  Wheelchair assist level: Minimal Assistance - Patient > 75% Max wheelchair distance: 50 feet    Wheelchair 50 feet with 2 turns activity    Assist    Wheelchair 50 feet with 2 turns activity did not occur: Safety/medical concerns        Wheelchair 150 feet activity     Assist Wheelchair 150 feet activity did not occur: Safety/medical concerns          Medical Problem List and Plan: 1. Debility secondary to [place]  Team conference today to discuss current and goals and coordination of care, home and environmental barriers, and discharge planning with nursing, case manager, and therapies.  2. Antithrombotics: -DVT/anticoagulation:Pharmaceutical:Lovenox -antiplatelet therapy: N/A 3.Neuropathy/chornic pain/Pain Management:[place] patch daily. On cymbalta. Continue low dose oxycodone prn--monitor for sedation  Controlled on 2/3  Monitor with increased mobility 4. Mood:LCSW to follow for evaluation and support. -antipsychotic agents: N/A 5. Neuropsych: This patientiscapable of making decisions onherown behalf. 6. Skin/Wound Care:Monitor incision daily for healing 7. Fluids/Electrolytes/Nutrition:Offer supplement during the day.Advised patient to have family bring in food from home to help with intake. 8. Aspiration [id number] day course of cefepime on 1/31.  Transitioned to bactrim prophylaxis 9. PAF with RVR: Monitor HR --continue metoprolol and ASA.   ECG on 1/29 showing A. fib with RVR, ECG remains pending  Monitor with increased activity 10. Left femur fracture s/p IM nailing: WBAT- lovenox through 04/07/2019 11. History of Chronic Hypotension:Continue midodrinetid.  Controlled on 2/3  Monitor with increased mobility 12. Autoimmune hemolytic anemia: On chronic steroids and bactrim for prophylaxis  Hemoglobin 8.9 on 2/2  Will also order vitamin B12/folate with next labs 13. OSA: Needs to use CPAP at nights and whenever napping.  14.Obstructive lung disease/Chronic bronchitis:Continue prednisone, home dose of 5 mg of prednisone per day  Taper steroids to home dose  Supplemental oxygen dependent at baseline  Respiratory status stable on Dulera bid.    Schedule nebulizers  CXR on 1/31 personally reviewed, worsening, repeat CXR ordered  Discussed with respiratory therapy evaluation of CPAP due to desats overnight 15.Acute onChronic diastolic CHF: Monitor for signs and symptoms of fluid overload.Now on 1500 cc FR.  Continue low dose metoprolol and lasix 60 mg daily (was on 40 mg bid PTA?). Heart healthy diet.  Filed Weights   04/01/19 0400 04/02/19 0500  Weight: 96.3 kg 92.3 kg   Controlled on 2/3 16. Left apical lung nodule: Follow up CT in 12 months recommended. 17. Fecal incontinence:   KUB ordered 18.  Hypoalbuminemia  Supplement initiated on 2/2 19. Urinary incontinence  PVRs ordered, not completed  LOS: 2 days A FACE TO FACE EVALUATION WAS PERFORMED  Pearlena Ow Lorie Phenix 04/02/2019, 8:17 AM

## 2019-04-02 NOTE — Progress Notes (Signed)
Requested patient's daughter bring in mask for BIPAP tonight, patient removing the one provided by hospital

## 2019-04-02 NOTE — Progress Notes (Signed)
Soap suds enema given per order. Some results. Reported to oncoming RN

## 2019-04-02 NOTE — Progress Notes (Signed)
Social Work Assessment and Plan   Patient Details  Name: Virginia Thompson MRN: [id number] Date of Birth: [birth year]  Today's Date: 04/02/2019  Problem List:  Patient Active Problem List   Diagnosis Date Noted  . Fecal incontinence   . Urinary incontinence   . PAF (paroxysmal atrial fibrillation) ([place])   . Acute on chronic diastolic (congestive) heart failure ([place])   . Atrial fibrillation with RVR ([place])   . Chronic pain syndrome   . Hypoalbuminemia due to protein-calorie malnutrition ([place])   . OSA (obstructive sleep apnea)   . Slow transit constipation   . Supplemental oxygen dependent   . Debility 03/31/2019  . Physical debility 03/31/2019  . Aspiration pneumonia ([place]) 03/27/2019  . Acute on chronic respiratory failure with hypoxia and hypercapnia ([place]) 03/24/2019  . Hypertension   . Hypotension   . Obstructive sleep apnea on CPAP 03/13/2019  . Chronic pain after cancer treatment 03/13/2019  . At risk for adverse drug event 03/13/2019  . Acute blood loss anemia 03/13/2019  . History of lymphoma 03/13/2019  . History of ovarian cancer 03/13/2019  . Mood disorder ([place]) 03/13/2019  . Closed fracture of left femur, unspecified fracture morphology, sequela   . Surgery, elective   . Chronic bronchitis ([place])   . Acute on chronic diastolic CHF (congestive heart failure) ([place]) 03/07/2019  . 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 Closed left hip fracture, initial encounter ([place]) 03/06/2019   Past Medical History:  Past Medical History:  Diagnosis Date  . Blood transfusion without reported diagnosis   . Cancer ([place])   . CHF (congestive heart failure) ([place])   . Hypertension   . Hypertension   . Lymphoma ([place])   . Sleep apnea    Past Surgical History:  Past Surgical History:  Procedure Laterality Date  . ABDOMINAL HYSTERECTOMY    . APPENDECTOMY    . BACK SURGERY    . BREAST CYST ASPIRATION Bilateral   . CHOLECYSTECTOMY    . EYE SURGERY    . INTRAMEDULLARY (IM) NAIL  INTERTROCHANTERIC Left 03/07/2019   Procedure: INTRAMEDULLARY (IM) NAIL INTERTROCHANTRIC;  Surgeon: Altamese Elkader, MD;  Location: [facility];  Service: Orthopedics;  Laterality: Left;  . JOINT REPLACEMENT Left   . SPLENECTOMY    . TONSILLECTOMY     Social History:  reports that she quit smoking about 15 years ago. Her smoking use included cigarettes. She has never used smokeless tobacco. She reports that she does not drink alcohol or use drugs.  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 Pt reports she stopped working at age 36. Unable to provide reason. Read: Yes Write: Yes Employment Status: Retired Age Retired: 73 Public relations account executive Issues: N/A Guardian/Conservator: N/A   Abuse/Neglect Abuse/Neglect Assessment Can Be Completed: Yes Physical Abuse: Denies Verbal Abuse: Denies Sexual Abuse: Denies Exploitation of patient/patient's resources: Denies Self-Neglect: Denies  Emotional Status Pt's affect, behavior and adjustment status: Pt in good spirits; poor historian at time. Recent Psychosocial Issues: N/A Psychiatric History: Denies Substance Abuse History: Denies  Patient / Family Perceptions, Expectations & Goals Pt/Family understanding of illness & functional limitations: Family  has a general understanding of pt care needs and intends to continue to provide support Premorbid pt/family roles/activities: Pt enjoyed crocheting Anticipated changes in roles/activities/participation: No anticipated  changes Pt/family expectations/goals: Family goal is for pt to be able to get up and take a few steps  [employer]: None Premorbid Home Care/DME Agencies: None Transportation available at discharge: Dtr to transport home at discharge Resource referrals recommended: Neuropsychology  Discharge Planning Living Arrangements: Children Support Systems: Children Type of Residence: Private residence [place] Name: unsure Insurance Resources: [employer], Multimedia programmer (specify)(UHC Medicare) Financial Screen Referred: No Living Expenses: Own Money Management: Family Does the patient have any problems obtaining your medications?: No Home Management: Pt dtr will continue to provide assistance with managing medications/finances for patient. Social Work Anticipated Follow Up Needs: [place] Additional Notes/Comments: Plans to discharge to home Expected length of stay: 14-18 days  Clinical Impression SW met with pt in room at bedside to introduce self, explain role, and discuss discharge process. Pt reports that her plan is to d/c home with the support from her dtr Virginia Thompson who lives in her home. Pt denies jail/prison hx. No HCPOA. Reported DME: RW, rollator walker, BSC, w/c, ramped entrance, and TTB. Pt also has Home o2/ 2L [place] and CPAP. SW called pt dtr Virginia Thompson [phone]) to introduce self, explain role, and discuss d/c process. Confirms above d/c plan. Home o2 with [facility]. Unable to remember DME company for other equipment. SW to follow-up after team conference on anticipated d/c date.   [name], MSW, Social Worker Office (preferred): [phone] Cell: [phone] Fax: [phone]  04/02/2019, 9:01 AM

## 2019-04-02 NOTE — Evaluation (Signed)
Speech Language Pathology Assessment and Plan  Patient Details  Name: Virginia Thompson MRN: [id number] Date of Birth: 06-18-1928  SLP Diagnosis: Dysphagia  Rehab Potential: Fair ELOS: 14-18 days    Today's Date: 04/02/2019 SLP Individual Time: 9924-2683 SLP Individual Time Calculation (min): 45 min   Problem List:  Patient Active Problem List   Diagnosis Date Noted  . Fecal incontinence   . Urinary incontinence   . PAF (paroxysmal atrial fibrillation) ([place])   . Acute on chronic diastolic (congestive) heart failure ([place])   . Atrial fibrillation with RVR ([place])   . Chronic pain syndrome   . Hypoalbuminemia due to protein-calorie malnutrition ([place])   . OSA (obstructive sleep apnea)   . Slow transit constipation   . Supplemental oxygen dependent   . Debility 03/31/2019  . Physical debility 03/31/2019  . Aspiration pneumonia ([place]) 03/27/2019  . Acute on chronic respiratory failure with hypoxia and hypercapnia ([place]) 03/24/2019  . Hypertension   . Hypotension   . Obstructive sleep apnea on CPAP 03/13/2019  . Chronic pain after cancer treatment 03/13/2019  . At risk for adverse drug event 03/13/2019  . Acute blood loss anemia 03/13/2019  . History of lymphoma 03/13/2019  . History of ovarian cancer 03/13/2019  . Mood disorder ([place]) 03/13/2019  . Closed fracture of left femur, unspecified fracture morphology, sequela   . Surgery, elective   . Chronic bronchitis ([place])   . Acute on chronic diastolic CHF (congestive heart failure) ([place]) 03/07/2019  . 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 Closed left hip fracture, initial encounter ([place]) 03/06/2019   Past Medical History:  Past Medical History:  Diagnosis Date  . Blood transfusion without reported diagnosis   . Cancer ([place])   . CHF (congestive heart failure) ([place])   . Hypertension   . Hypertension   . Lymphoma ([place])   . Sleep apnea    Past Surgical History:  Past Surgical History:  Procedure Laterality Date  .  ABDOMINAL HYSTERECTOMY    . APPENDECTOMY    . BACK SURGERY    . BREAST CYST ASPIRATION Bilateral   . CHOLECYSTECTOMY    . EYE SURGERY    . INTRAMEDULLARY (IM) NAIL INTERTROCHANTERIC Left 03/07/2019   Procedure: INTRAMEDULLARY (IM) NAIL INTERTROCHANTRIC;  Surgeon: Altamese Tattnall, MD;  Location: [facility];  Service: Orthopedics;  Laterality: Left;  . JOINT REPLACEMENT Left   . SPLENECTOMY    . TONSILLECTOMY      Assessment / Plan / Recommendation Clinical Impression Virginia Thompson is an 86 year old female with history of diastolic CHF--2 L oxygen dependent, OSA-CPAP, chronic bronchitis, HTN, lymphoma, autoimmune hemolytic anemia, admission 1/8 for left proximal femur fracture s/p IM nailing and postop course significant for hypotension, ABLA s/p 2 units PRBC as well as acute on chronic respiratory failure.  She was discharged to SNF on 1/14 but readmitted 03/24/2019 severe pain BLE, hypotension, acute renal injury and shortness of breath.  She was started on sepsis protocol due to temperature elevation and placed on BiPAP due to evidence of respiratory acidosis. Patient's mental status declined with acute on chronic hypoxic hypercapnic failure requiring intubation in ED and she received IV Lasix due to elevated BNP signs of fluid overload and was started on stress dose steroids as well as IV antibiotics for aspiration pneumonia.  Chest CT negative for PE and showed moderate BLL consolidation, moderate severity patchy bilateral upper lobe atelectasis and moderate bilateral pleural effusions.  2D echo done showing EF with aortic stenosis.  She was  extubated to 3L oxygen per [place] on 01/26 felt that patient respiratory failure multifactorial due to opiates in setting of multiple medical issues complicated by aspiration pneumonia.  Swallow evaluation completed with recommendation of dysphagia 3, thin liquids. CXR showed interval worsening of bilateral perihilar opacities--edema v/s infection.   She was treated with  IV lasix for fluid overload and placed on 1500 cc FR.  Also started on burst of steroid due to concerns of COPD exacerbation.    Therapy ongoing and patient continues to be limited by pain, anxiety/fear and weakness affecting mobility and ADLs. CIR recommended due to functional decline. Pt admited on 03/31/19 to CIR.    At rest, pt with appearance of increased work of breathing on 2liters O2 with O2 sats at 87%. SLP increased to 3 liters with sats at 97%. Pt reports overall significant fatigue.   Pt is at increased risk of aspiration d/t multiple medical diagnosises and overall respiratory function.    Pt presents with mild s/s of oropharyngeal dysphagia. Pt's oral phase is c/b prolonged mastication with trials of regular and increased work of breathing despite maintaining O2 sats (upper and lower dentures in place). Mastication of dysphagia 3 textures continue to be functional. Pt consumed medicine whole with puree without issue. Pt with cough at baseline that continued to be present during consumption of POs. However when consuming thin liquids via straw and cup pt's cough was more subjectively more severe/worse. When consuming nectar thick liquids via cup pt was free of cough. Given signficant increase in coughing with consumption of thin liquids recommend downgrading liquids to nectar thick with instrumental study on 04/03/19 to assess for cause of cough with thin liquids and to assess airway protect with thin liquids.    Skilled Therapeutic Interventions          BSE completed, liquids downgraded, education provided to nurse and posted in pt's room  SLP Assessment  Patient will need skilled Speech Lanaguage Pathology Services during CIR admission    Recommendations  SLP Diet Recommendations: Dysphagia 3 (Mech soft);Nectar Liquid Administration via: Cup Medication Administration: Whole meds with puree Supervision: Patient able to self feed Compensations: Small sips/bites;Slow rate Postural Changes  and/or Swallow Maneuvers: Seated upright 90 degrees Oral Care Recommendations: Oral care BID Follow up Recommendations: (TBD) Equipment Recommended: To be determined TBD   SLP Frequency 3 to 5 out of 7 days   SLP Duration  SLP Intensity  SLP Treatment/Interventions 14-18 days  Minumum of 1-2 x/day, 30 to 90 minutes  Patient/family education;Dysphagia/aspiration precaution training    Pain Pain Assessment Pain Scale: 0-10 Pain Score: 5  Pain Type: Acute pain Pain Location: Hip Pain Orientation: Left Pain Descriptors / Indicators: Aching Pain Frequency: Intermittent Pain Intervention(s): Medication (See eMAR)     Bedside Swallowing Assessment General Date of Onset: 03/24/19 Previous Swallow Assessment: BSE Diet Prior to this Study: Dysphagia 3 (soft);Thin liquids Temperature Spikes Noted: No Respiratory Status: Supplemental O2 delivered via (comment)([place] 3liters) History of Recent Intubation: Yes Length of Intubations (days): 1 days Date extubated: 03/25/19 Behavior/Cognition: Cooperative;Alert Oral Cavity - Dentition: Dentures, bottom;Dentures, top Self-Feeding Abilities: Able to feed self Vision: Functional for self-feeding Patient Positioning: Upright in bed Baseline Vocal Quality: Normal Volitional Cough: Strong Volitional Swallow: Able to elicit  Oral Care Assessment   Ice Chips Ice chips: Not tested Thin Liquid Thin Liquid: Impaired Presentation: Cup;Self Fed;Straw Pharyngeal  Phase Impairments: Cough - Immediate Nectar Thick Nectar Thick Liquid: Within functional limits Presentation: Cup;Self Fed Honey Thick Honey Thick  Liquid: Not tested Puree Puree: Within functional limits Presentation: Self Fed;Spoon Solid Solid: Impaired(trials of regular were more impaired, dysphagia 3 were functional) Presentation: Self Fed Oral Phase Impairments: Impaired mastication BSE Assessment Risk for Aspiration Impact on safety and function: Mild aspiration  risk Other Related Risk Factors: History of pneumonia;Deconditioning;Decreased respiratory status  Short Term Goals: Week 1: SLP Short Term Goal 1 (Week 1): Pt will consume least restrictive diet with minimal overt s/s of aspiration or dysphagia.  Refer to Care Plan for Long Term Goals  Recommendations for other services: None   Discharge Criteria: Patient will be discharged from SLP if patient refuses treatment 3 consecutive times without medical reason, if treatment goals not met, if there is a change in medical status, if patient makes no progress towards goals or if patient is discharged from hospital.  The above assessment, treatment plan, treatment alternatives and goals were discussed and mutually agreed upon: by patient  Gabrielle Mester 04/02/2019, 11:42 AM

## 2019-04-02 NOTE — Patient Care Conference (Signed)
Inpatient RehabilitationTeam Conference and Plan of Care Update Date: 04/02/2019   Time: 11:40 AM    Patient Name: Virginia Thompson      Medical Record Number: [id number]  Date of Birth: 04-16-1928 Sex: Female         Room/Bed: 4M06C/4M06C-01 Payor Info: Payor: [employer] MEDICARE / Plan: [facility] MEDICARE / Product Type: *No Product type* /    Admit Date/Time:  03/31/2019  5:56 PM  Primary Diagnosis:  Debility  Patient Active Problem List   Diagnosis Date Noted  . Drug induced constipation   . Dysphagia   . Fecal incontinence   . Urinary incontinence   . PAF (paroxysmal atrial fibrillation) ([place])   . Acute on chronic diastolic (congestive) heart failure ([place])   . Atrial fibrillation with RVR ([place])   . Chronic pain syndrome   . Hypoalbuminemia due to protein-calorie malnutrition ([place])   . OSA (obstructive sleep apnea)   . Slow transit constipation   . Supplemental oxygen dependent   . Debility 03/31/2019  . Physical debility 03/31/2019  . Aspiration pneumonia ([place]) 03/27/2019  . Acute on chronic respiratory failure with hypoxia and hypercapnia ([place]) 03/24/2019  . Hypertension   . Hypotension   . Obstructive sleep apnea on CPAP 03/13/2019  . Chronic pain after cancer treatment 03/13/2019  . At risk for adverse drug event 03/13/2019  . Acute blood loss anemia 03/13/2019  . History of lymphoma 03/13/2019  . History of ovarian cancer 03/13/2019  . Mood disorder ([place]) 03/13/2019  . Closed fracture of left femur, unspecified fracture morphology, sequela   . Surgery, elective   . Chronic bronchitis ([place])   . Acute on chronic diastolic CHF (congestive heart failure) ([place]) 03/07/2019  . 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 Closed left hip fracture, initial encounter ([place]) 03/06/2019    Expected Discharge Date: Expected Discharge Date: 04/19/19  Team Members Present: Physician leading conference: Dr. Delice Lesch Social Worker Present: Lennart Pall, LCSW(Auria Barbra Sarks,  LCSW) Nurse Present: Dorien Chihuahua, RN; Benjie Karvonen, RN Case Manager: Karene Fry, RN PT Present: [name] Sax, PT OT Present: Simonne Come, OT SLP Present: Stormy Fabian, SLP PPS Coordinator present : Gunnar Fusi, Novella Olive, PT     Current Status/Progress Goal Weekly Team Focus  Bowel/Bladder   incontinent of b/b  maintain skin integrity  provide frequent incontinence care   Swallow/Nutrition/ Hydration   dysphagia 3 with nectar thick liquids  Mod I  BSE and MBS scheduled for 04/03/19   ADL's   bed mobility max A, transfers mod A  Supervision  functional mobility/transfers, ambulation, car transfers, WC mobility, improved endurance, strength   Mobility   bed mobility max A, transfers mod /min A, WC mobility 104ft  supervision  functional mobility/transfers, ambulation, LE strength, balance/coordination   Communication             Safety/Cognition/ Behavioral Observations            Pain   prn oxycodone 2.5mg  given 2/1 x1 and 2/2 x1  administer oxycodone <=1x/24h  continue to decrease pain administration   Skin   skin is intact; incisions are approximated  maintain skin integrity  incontinence care and q2h repositioning    Rehab Goals Patient on target to meet rehab goals: Yes *See Care Plan and progress notes for long and short-term goals.     Barriers to Discharge  Current Status/Progress Possible Resolutions Date Resolved   Nursing  PT  Medical stability;Incontinence  increased O2 demands              OT Incontinence                SLP Medical stability repeat chest x-ray planned for 04/02/19            SW                Discharge Planning/Teaching Needs:  Plan to discharge to home with dtr who will provide 24/7 care  Family education per rehab recommendations   Team Discussion: Chronic pain at baseline, EKG ordered, hypotension controlled, repeat labs, desating at night on CPAP, did chest xray, abd xray, urine inc, PVRs ordered.  RN PVR  done 68 cc, O2 @L  baseline, sats 88-92 okay, BS checks okay.  OT fatigued, transfers mod A, rest breaks required, inc, S goals.  PT too tired, mod/max bed, transfers mod A, S goals.  SLP swallow nectar thick, worse cough with thins.   Revisions to Treatment Plan: N/A     Medical Summary Current Status: Debility secondary to HCAP Weekly Focus/Goal: Improve mobility, anemia, bronchitis, CHF, bowel/bladder incontinence, PAF, dysphagia  Barriers to Discharge: Incontinence;Medical stability;Weight;Other (comments)  Barriers to Discharge Comments: SOB Possible Resolutions to Barriers: Therapies, PVRs ordered, KUB/CXR ordered, follow weights, follow labs, ECG ordered, advance diet as tolerated   Continued Need for Acute Rehabilitation Level of Care: The patient requires daily medical management by a physician with specialized training in physical medicine and rehabilitation for the following reasons: Direction of a multidisciplinary physical rehabilitation program to maximize functional independence : Yes Medical management of patient stability for increased activity during participation in an intensive rehabilitation regime.: Yes Analysis of laboratory values and/or radiology reports with any subsequent need for medication adjustment and/or medical intervention. : Yes   I attest that I was present, lead the team conference, and concur with the assessment and plan of the team.   Jodell Cipro M 04/03/2019, 10:04 AM   Team conference was held via web/ teleconference due to [place] - 19

## 2019-04-02 NOTE — Progress Notes (Signed)
PA aware of critical lab result

## 2019-04-02 NOTE — Progress Notes (Signed)
Occupational Therapy Session Note  Patient Details  Name: Virginia Thompson MRN: [id number] Date of Birth: 11/28/1928  Today's Date: 04/02/2019 OT Individual Time: 1305-1400 OT Individual Time Calculation (min): 55 min    Short Term Goals: Week 1:  OT Short Term Goal 1 (Week 1): Pt will complete bathing with min assist at sit > stand level OT Short Term Goal 2 (Week 1): Pt will complete LB dressing with mod assist (including socks/shoes) OT Short Term Goal 3 (Week 1): Pt will complete toilet transfer with Min assist OT Short Term Goal 4 (Week 1): Pt will complete 1 grooming task in standing to demonstrate increased standing tolerance  Skilled Therapeutic Interventions/Progress Updates:    Treatment session with focus on functional mobility and activity tolerance.  Pt received upright in bed on 2L O2 sats 86%, increased to 89-90% with cues for breathing technique.  Pt required increased time and encouragement to complete bed mobility.  Requiring max assist for bed mobility to come to EOB.  Pt sats dropped to 80% with increase work of breathing, therefore increased to 2.5L (per RN, respiratory okay with 2-3L O2) with pt able to return to 89-90% in 3 mins.  Max encouragement and use of +2 for sit > stand with pt only requiring mod assist but requiring the bilateral cues.  Pt incontinent of bowel and bladder.  Pt able to maintain standing with min assist while 2nd person completed hygiene and donning clean brief.  Completed stand pivot transfer with +2 for safety, min assist and max multimodal cues for sequencing of pivot steps.   Pt required increased time for all mobility this session, even more than sessions yesterday.  RN aware of pt requiring increased time for initiation and motor planning.  Pt remained upright in w/c with nurse tech present to change bed sheets.  Therapy Documentation Precautions:  Precautions Precautions: Fall Restrictions Weight Bearing Restrictions: No LLE Weight  Bearing: Weight bearing as tolerated General:   Vital Signs: Therapy Vitals Temp: 98 F (36.7 C) Temp Source: Oral Pulse Rate: 83 Resp: 16 BP: (!) 112/54 Patient Position (if appropriate): Sitting Oxygen Therapy SpO2: 94 % O2 Device: Nasal Cannula O2 Flow Rate (L/min): 2.5 L/min Pain:  Pt with no c/o pain   Therapy/Group: Individual Therapy  Simonne Come 04/02/2019, 3:58 PM

## 2019-04-02 NOTE — Care Management (Signed)
[facility] Individual Statement of Services  Patient Name:  Virginia Thompson  Date:  04/02/2019  Welcome to the [facility].  Our goal is to provide you with an individualized program based on your diagnosis and situation, designed to meet your specific needs.  With this comprehensive rehabilitation program, you will be expected to participate in at least 3 hours of rehabilitation therapies Monday-Friday, with modified therapy programming on the weekends.  Your rehabilitation program will include the following services:  Physical Therapy (PT), Occupational Therapy (OT), 24 hour per day rehabilitation nursing, Therapeutic Recreaction (TR), Neuropsychology, Case Management (Social Worker), Rehabilitation Medicine, Nutrition Services and Pharmacy Services  Weekly team conferences will be held on Wednesdays to discuss your progress.  Your Social Worker will talk with you frequently to get your input and to update you on team discussions.  Team conferences with you and your family in attendance may also be held.  Expected length of stay: 14-18 days   Overall anticipated outcome: Minimal Assistance to Supervision  Depending on your progress and recovery, your program may change. Your Social Worker will coordinate services and will keep you informed of any changes. Your Social Worker's name and contact numbers are listed  below.  The following services may also be recommended but are not provided by the [facility] will be made to provide these services after discharge if needed.  Arrangements include referral to agencies that provide these services.  Your insurance has been verified to be:  [facility] Dual Medicare/Medicaid  Your primary doctor is:  Angeline Slim  Pertinent information will be shared  with your doctor and your insurance company.  Social Worker:  [name], MSW  Information discussed with and copy given to patient by: Rana Snare, 04/02/2019, 8:08 AM

## 2019-04-02 NOTE — Progress Notes (Signed)
ABG sample was collected and sent to LAB. Lab was called and notified.

## 2019-04-02 NOTE — Progress Notes (Signed)
Physical Therapy Session Note  Patient Details  Name: Virginia Thompson MRN: [id number] Date of Birth: July 31, 1928  Today's Date: 04/02/2019 PT Individual Time: 1030-1120 and 1430-1458 PT Individual Time Calculation (min): 50 min  28 min and Today's Date: 04/02/2019 PT Missed Time: 10 Minutes Missed Time Reason: Patient fatigue  Short Term Goals: Week 1:  PT Short Term Goal 1 (Week 1): Pt will perform bed mobility w/ mod assist PT Short Term Goal 2 (Week 1): Pt will perform sit<>stand w/ min assist PT Short Term Goal 3 (Week 1): Pt will ambulate 15' w/ LRAD w/ min assist PT Short Term Goal 4 (Week 1): Pt will tolerate 60 min of upright activity w/ minimal increase in fatigue  Skilled Therapeutic Interventions/Progress Updates:   Treatment Session 1: 1030-1120 50 min  Received pt supine in bed, pt agreeable to therapy, and reported 5/10 pain along L knee. RN notified and administered pain medication. Pt stated she was very tired and hasn't slept well since she's been here. Pt on 3L O2 increasing to 3.5L O2 during session and pt demonstrating increased effort when breathing despite educated on pursed lip breathing techniques. Session focused on bed mobility, LE strength, and improved activity tolerance. At rest O2 sat 89% on 3L O2. Therapist increased O2 to 3.5L and O2 sat 94%. Attempted OOB mobility however, pt too fatigued, but agreeable to bed level exercises. Pt performed bilateral ankle pumps x10. Pt educated on pursed lip breathing techniques and able to perform 3x10 repetitions throughout session. Pt performed heel slides x5 on RLE and x3 on LLE with significantly decreased ROM and stopped due to increased pain. Pt performed bilateral hip abduction x5 with AAROM on LLE. Pt able to bridge and scoot to [facility] with max A to reposition in bed to take medication. Pt performed active hip abd/add x 5 reps in hooklying with verbal cues for technique. Pt practiced rolling to L and R with mod A and use of  bedrails. However, pt unable to fully come onto L side due to increased L LE pain and returned to midline. Pt scooted to HOB x trial 2 with max A, however pt unable to come into bridge position this time. O2 sat 91% increasing to 94% on 3.5L with extensive time and cues for pursed lip breathing. Concluded session with pt supine in bed, needs within reach, bed alarm on, and pt on 3.5L O4 with O2 sat at 94-95%. RN made aware of current status. 10 minutes missed of skilled physical therapy due to patient fatigue.   Treatment Session 2: [id number] 28 min Received pt sitting in WC, pt agreeable to therapy, and reported minor pain but did not state level. Pt on 3.5L O2 increasing to 4L with activity. Session focused on functional mobility/transfers, dynamic standing balance, LE strength, balance/coordination, and improved activity tolerance. Pt transported to therapy gym in [facility] for time management purposes. Attempted car transfer, however pt demonstrating increased difficulty scooting forward in WC to place feet on floor. Pt very fatigued and taking extensive time to scoot forward in [place]. O2 sat 99% on 4L O2 via . Therapist suggested sit<>stand instead; pt agreeable. Pt transferred sit<>stand with RW mod A x1 trial. Pt with extensive time to come to standing position but once standing able to maintain balance with CGA. Pt instructed to sit and required extensive time and cues to transfer stand<>sit. Pt transported back to gym in [facility]. Concluded session with pt sitting in WC, needs within reach, and seatbelt alarm on.  Therapy Documentation Precautions:  Precautions Precautions: Fall Restrictions Weight Bearing Restrictions: No LLE Weight Bearing: Weight bearing as tolerated   Therapy/Group: Individual Therapy Alfonse Alpers PT, DPT   04/02/2019, 7:43 AM

## 2019-04-02 NOTE — Progress Notes (Signed)
NT called this nurse stating that the pt was having trouble breathing. Pt had removed  CPAP. o2 sat= 60%-70%. Resident denies removing CPAP (NT states that CPAP was off when she entered the room); o2 @ 2L [place] w/ 02 sat=90-95%. VSS. Lungs sounds=crackles. No s/s of distress. No c/o dyspnea with [place]. Will continue to monitor.  No further difficulty with desat at this time. O2 sat remained in 90-95% with 2L via . Pt was repositioned for comfort with no complaint. Bed in lowest position with call bell within reach.Bed alarm on and functioning properly.

## 2019-04-02 NOTE — Progress Notes (Signed)
Social Work Patient ID: Virginia Thompson, female   DOB: 1928/12/15, 86 y.o.   MRN: [id number]    SW met with pt in room to provide updates from team, and informed on anticipated d/c date 04/19/2019.   SW called pt dtr [name] to provide updates on above. She recognizes pt will require time to recover. SW did remind on date of d/c to ensure pt has an oxygen tank for pt. SW informed there will be updates on recommendations for pt care needs as d/c date approaches.   [name], MSW, Social Worker Office (preferred): [phone] Cell: [phone] Fax: [phone]

## 2019-04-03 ENCOUNTER — Inpatient Hospital Stay (HOSPITAL_COMMUNITY): Payer: Medicare Other

## 2019-04-03 ENCOUNTER — Encounter (HOSPITAL_COMMUNITY): Payer: Medicare Other | Admitting: Speech Pathology

## 2019-04-03 ENCOUNTER — Inpatient Hospital Stay (HOSPITAL_COMMUNITY): Payer: Medicare Other | Admitting: Occupational Therapy

## 2019-04-03 DIAGNOSIS — R1310 Dysphagia, unspecified: Secondary | ICD-10-CM

## 2019-04-03 DIAGNOSIS — Z515 Encounter for palliative care: Secondary | ICD-10-CM

## 2019-04-03 DIAGNOSIS — K5903 Drug induced constipation: Secondary | ICD-10-CM

## 2019-04-03 DIAGNOSIS — Z7189 Other specified counseling: Secondary | ICD-10-CM

## 2019-04-03 LAB — GLUCOSE, CAPILLARY
Glucose-Capillary: 88 mg/dL (ref 70–99)
Glucose-Capillary: 155 mg/dL — ABNORMAL HIGH (ref 70–99)
Glucose-Capillary: 117 mg/dL — ABNORMAL HIGH (ref 70–99)
Glucose-Capillary: 119 mg/dL — ABNORMAL HIGH (ref 70–99)

## 2019-04-03 LAB — PROCALCITONIN: Procalcitonin: <0.1 ng/mL

## 2019-04-03 MED ORDER — TAMSULOSIN HCL 0.4 MG PO CAPS
0.4000 mg | ORAL_CAPSULE | Freq: Every day | ORAL | Status: DC
  Administered 2019-04-03 – 2019-04-05 (×3): 0.4 mg via ORAL
  Filled 2019-04-03 (×3): qty 1

## 2019-04-03 NOTE — Consult Note (Signed)
Consultation Note Date: 04/03/2019   Patient Name: Virginia Thompson  DOB: [birth year]  MRN: [id number]  Age / Sex: 86 y.o., female  PCP: Angeline Slim, MD Referring Physician: Jamse Arn, MD  Reason for Consultation: Establishing goals of care and Psychosocial/spiritual support  HPI/Patient Profile: 86 y.o. 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 Was admitted to [facility] 1/25 for resp failure, intubated, extubated 1/26, and remained on hospitalist service until 2/1 discharge to CIR.    Patient has not progressed as well as hoped and rehabilitation secondary to her multiple comorbidities.  CCM/pulmonary was consulted on 04-02-19 for chronic hypoxic and hypercapnic respiratory failure.  Patient and family face treatment option decisions, advanced directive decisions and anticipatory care needs.    Clinical Assessment and Goals of Care:  This NP Wadie Lessen reviewed medical records, received report from team, assessed the patient and then meet at the patient's bedside and spoke to daughter by phone  to discuss diagnosis, prognosis, GOC, EOL wishes disposition and options.  Concept of Hospice and Palliative Care were discussed  A detailed discussion was had today regarding advanced directives.  Concepts specific to code status, artifical feeding and hydration, continued IV antibiotics and rehospitalization was had.  The difference between a aggressive medical intervention path  and a palliative comfort care path for this patient at this time was had.  Values and goals of care important to patient and family were attempted to be elicited.  Created space and opportunity for daughter to explore her thoughts and  feelings regarding her mother's current medical situation.  She is coming to the realization that her mother continues to decline physically and functionally in spite of medical interventions.  We discussed the concept of adult failure to thrive and the limitations of medical interventions to prolong quality of life when the body does fail to thrive.  Patient and daughter struggle with the limited options for transition of care when discharged from [facility].  Limited options of long-term care facility versus home with [facility]  With majority of hands-on care falling to the family or out-of-pocket caregivers  Follow-up goals of care meeting scheduled for Monday at 11:00 with daughter and myself.  Natural trajectory and expectations at EOL were discussed.  Questions and concerns addressed.   Family encouraged to call with questions or concerns.    PMT will continue to support holistically.   No documented healthcare power of attorney or advanced directive.  Patient's daughter is main support and decision maker at this time      SUMMARY OF RECOMMENDATIONS    Code Status/Advance Care Planning:  Full code   Encouraged family to consider DNR/DNI status understanding poor outcomes in similar patients.   Palliative Prophylaxis:   Aspiration, Bowel Regimen, Delirium Protocol, Frequent Pain Assessment and Oral Care  Additional Recommendations (Limitations, Scope, Preferences):  Full Scope Treatment  Psycho-social/Spiritual:   Desire for further Chaplaincy support:no  Additional Recommendations: Education on Hospice  Prognosis:   Unable to determine  Discharge Planning: To Be Determined      Primary Diagnoses: Present on Admission: . Physical debility   I have reviewed the medical record, interviewed the patient and family, and examined the patient. The following aspects are pertinent.  Past Medical History:  Diagnosis Date  . Blood transfusion without reported diagnosis   .  Cancer ([place])   . CHF (congestive heart failure) ([place])   . Hypertension   . Hypertension   . Lymphoma ([place])   . Sleep apnea    Social History   Socioeconomic History  . Marital status: Widowed    Spouse name: Not on file  . Number of children: Not on file  . Years of education: Not on file  . Highest education level: Not on file  Occupational History  . Not on file  Tobacco Use  . Smoking status: Former Smoker    Types: Cigarettes    Quit date: 04/09/2003    Years since quitting: 15.9  . Smokeless tobacco: Never Used  Substance and Sexual Activity  . Alcohol use: No  . Drug use: No  . Sexual activity: Not Currently  Other Topics Concern  . Not on file  Social History Narrative  . Not on file   Social Determinants of Health   Financial Resource Strain:   . Difficulty of Paying Living Expenses: Not on file  Food Insecurity:   . Worried About Charity fundraiser in the Last Year: Not on file  . Ran Out of Food in the Last Year: Not on file  Transportation Needs:   . Lack of Transportation (Medical): Not on file  . Lack of Transportation (Non-Medical): Not on file  Physical Activity:   . Days of Exercise per Week: Not on file  . Minutes of Exercise per Session: Not on file  Stress:   . Feeling of Stress : Not on file  Social Connections:   . Frequency of Communication with Friends and Family: Not on file  . Frequency of Social Gatherings with Friends and Family: Not on file  . Attends Religious Services: Not on file  . Active Member of Clubs or Organizations: Not on file  . Attends Archivist Meetings: Not on file  . Marital Status: Not on file   Family History  Problem Relation Age of Onset  . Breast cancer Neg Hx    Scheduled Meds: . aspirin  81 mg Oral Daily  . chlorhexidine gluconate (MEDLINE KIT)  15 mL Mouth Rinse BID  . Chlorhexidine Gluconate Cloth  6 each Topical Daily  . DULoxetine  60 mg Oral Daily  . enoxaparin (LOVENOX) injection  40 mg  Subcutaneous Q24H  . feeding supplement (PRO-STAT SUGAR FREE 64)  30 mL Oral BID  . furosemide  60 mg Oral Daily  . insulin aspart  0-9 Units Subcutaneous TID WC  . levalbuterol  0.63 mg Nebulization QID  . metoprolol tartrate  12.5 mg Oral BID  . midodrine  10 mg Oral TID WC  . pantoprazole  40 mg Oral Daily  . polyethylene glycol  17 g Oral Daily  . pramipexole  1 mg Oral QHS  . [START ON 04/04/2019] predniSONE  10 mg Oral Q breakfast   Followed by  . [START ON 04/26/2019] predniSONE  5 mg Oral Q breakfast  . [START ON 2019-04-07] predniSONE  5 mg Oral Q breakfast  . senna-docusate  2 tablet Oral BID  . sodium chloride flush  10-40 mL Intracatheter Q12H  . sulfamethoxazole-trimethoprim  1 tablet Oral Q M,W,F  . tamsulosin  0.4 mg Oral QPC supper  . umeclidinium bromide  1 puff Inhalation Daily   Continuous Infusions: PRN Meds:.acetaminophen, albuterol, alum & mag hydroxide-simeth, bisacodyl, diphenhydrAMINE, guaiFENesin-dextromethorphan, ipratropium-albuterol, oxyCODONE, prochlorperazine **OR** prochlorperazine **OR** prochlorperazine, sodium chloride flush, sodium phosphate Medications Prior to Admission:  Prior to Admission medications   Medication Sig Start Date End Date Taking? Authorizing Provider  acetaminophen (TYLENOL) 500 MG tablet Take 500 mg by mouth every 6 (six) hours as needed for mild pain.  05/15/11   [provider]  albuterol (PROAIR HFA) 108 (90 Base) MCG/ACT inhaler Inhale 2 puffs into the lungs every 6 (six) hours as needed for wheezing or shortness of breath.  04/12/17   [provider]  aspirin EC 81 MG tablet Take 81 mg by mouth daily.     [provider]  buprenorphine Haze Rushing) 5 MCG/HR PTWK Place 1 patch onto the skin every Tuesday.     [provider]  docusate sodium (COLACE) 100 MG capsule Take 100 mg by mouth daily.     [provider]  DULoxetine (CYMBALTA) 60 MG capsule Take 60 mg by mouth daily.  03/15/17    [provider]  enoxaparin (LOVENOX) 40 MG/0.4ML injection Inject 0.4 mLs (40 mg total) into the skin daily. 03/13/19 04/12/19  Caren Griffins, MD  esomeprazole (NEXIUM) 40 MG capsule Take 40 mg by mouth daily.  01/16/18   [provider]  folic acid (FOLVITE) 1 MG tablet Take 1 mg by mouth 3 (three) times daily.  08/01/17   [provider]  furosemide (LASIX) 40 MG tablet Take 60 mg by mouth daily.  11/28/17   [provider]  loratadine (CLARITIN) 10 MG tablet Take 10 mg by mouth daily.    [provider]  metoprolol tartrate (LOPRESSOR) 25 MG tablet Take 12.5 mg by mouth 2 (two) times daily.  08/01/17   [provider]  midodrine (PROAMATINE) 10 MG tablet Take 1 tablet (10 mg total) by mouth 3 (three) times daily with meals. 03/13/19   Caren Griffins, MD  Multiple Vitamins-Minerals (ICAPS AREDS 2 PO) Take 2 tablets by mouth daily.    [provider]  oxyCODONE (OXY IR/ROXICODONE) 5 MG immediate release tablet Take 1 tablet (5 mg total) by mouth every 6 (six) hours as needed for severe pain. 03/21/19   Medina-Vargas, Monina C, NP  OXYGEN Inhale 2 L into the lungs.    [provider]  pramipexole (MIRAPEX) 1 MG tablet Take 1 mg by mouth at bedtime.  02/05/18   [provider]  predniSONE (DELTASONE) 5 MG tablet Take 5 mg by mouth daily.  12/25/17   [provider]  senna-docusate (SENOKOT-S) 8.6-50 MG tablet Take 2 tablets by mouth 2 (two) times daily.    [provider]  sulfamethoxazole-trimethoprim (BACTRIM DS,SEPTRA DS) 800-160 MG tablet Take 1 tablet by mouth every Monday, Wednesday, and Friday.  12/24/17   [provider]  tiotropium (SPIRIVA HANDIHALER) 18 MCG inhalation capsule Place 18 mcg into inhaler and inhale daily.  04/12/17   [provider]   Allergies  Allergen Reactions  . Aleve [Naproxen] Rash  . Mobic [Meloxicam] Rash  . Tape Rash   Review of Systems    Neurological: Positive for weakness.    Physical Exam  Constitutional:      Appearance: She is overweight. She is ill-appearing.  Cardiovascular:     Rate and Rhythm: Normal rate.  Musculoskeletal:     Comments: generalized weakness   Skin:    General: Skin is dry.  Neurological:     Mental Status: She is alert.     Vital Signs: BP (!) 132/53   Pulse 92   Temp 98.6 F (37 C)   Resp 20   Wt 89.4 kg   SpO2 97%   BMI 33.83 kg/m  Pain Scale: 0-10 POSS *See Group Information*: S-Acceptable,Sleep, easy to arouse Pain Score: 3    SpO2: SpO2: 97 % O2 Device:SpO2: 97 % O2 Flow Rate: .O2 Flow Rate (L/min): 3 L/min  IO: Intake/output summary:   Intake/Output Summary (Last 24 hours) at 04/03/2019 0908 Last data filed at 04/02/2019 1906 Gross per 24 hour  Intake 120 ml  Output --  Net 120 ml    LBM: Last BM Date: 04/02/19 Baseline Weight: Weight: 96.3 kg Most recent weight: Weight: 89.4 kg     Palliative Assessment/Data:  30 % at best   Discussed with Pamala Love PA-C  Time In: 1200 Time Out: 1310 Time Total: 70 minutes Greater than 50%  of this time was spent counseling and coordinating care related to the above assessment and plan.  Signed by: Wadie Lessen, NP   Please contact Palliative Medicine Team phone at [phone] for questions and concerns.  For individual provider: See Shea Evans

## 2019-04-03 NOTE — Progress Notes (Signed)
Speech Language Pathology Discharge Summary  Patient Details  Name: Virginia Thompson MRN: [id number] Date of Birth: 1928/04/08  Today's Date: 04/03/2019 SLP Individual Time: 1220-1240 SLP Individual Time Calculation (min): 20 min   Skilled Therapeutic Interventions:  Skilled treatment session targeted dysphagia and education with pt's daughter. Education completed with pt's daughter on results of MBS. Given pt's level of constant fatigue recommend that pt consume regular diet. Nursing reports that pt was too fatigued to feed herself this morning. Also pt would benefit from initial sips of liquids prior to food items.  Pt voiced understanding and stated that at baseline pt consumed medicine whole in puree.  Her daughter had brought in some regular texture snacks such as dry cereal and peanut butter crackers. SLP further facilitated session by providing skilled observation of pt consuming peanut butter crackers. Pt with good rate of consumption and complete [place] clearing. At this time, recommend pt be allowed regular snack brought in by pt's family. No further services are indicated at this time.   Patient has met 1 of 1 long term goals.  Patient to discharge at overall Modified Independent level.  Reasons goals not met:   N/A  Clinical Impression/Discharge Summary:   Recent MBS revealed that pt is protecting her airway when consuming thin liquids via cup or straw. Despite coughing at bedside, pt is achieving airway protection. All education with her daughter has been completed and pt is on least restrictive diet.   Care Partner:  Caregiver Able to Provide Assistance: Yes     Recommendation:  None      Equipment:   N/A  Reasons for discharge: Treatment goals met   Patient/Family Agrees with Progress Made and Goals Achieved: Yes(Daughter present)    Roshini Fulwider 04/03/2019, 12:52 PM

## 2019-04-03 NOTE — Progress Notes (Addendum)
[facility] PHYSICAL MEDICINE & REHABILITATION PROGRESS NOTE  Subjective/Complaints: Patient seen sitting up in bed this morning, finishing her breakfast.  She states she slept well overnight.  Noted to be using accessory muscles for breathing, however patient denies shortness of breath.  ROS: Denies CP, shortness of breath, nausea, vomiting, diarrhea.  Objective: Vital Signs: Blood pressure (!) 132/53, pulse 92, temperature 98.6 F (37 C), resp. rate 20, weight 89.4 kg, SpO2 97 %. DG Chest 2 View  Result Date: 04/02/2019 CLINICAL DATA:  Fecal incontinence and shortness of breath. EXAM: CHEST - 2 VIEW COMPARISON:  03/30/2019 FINDINGS: Right-sided PICC line is noted with tip projecting over the cavoatrial junction. Mild cardiac enlargement and aortic atherosclerosis. Bilateral upper and lower lung zone interstitial and airspace opacities are again noted. The aeration to lungs is not significantly changed in the interval. IMPRESSION: Persistent bilateral upper and lower lung zone airspace opacities compatible with multifocal infection. Electronically Signed   By: Kerby Moors M.D.   On: 04/02/2019 10:11   DG Abd 1 View  Result Date: 04/02/2019 CLINICAL DATA:  Fecal incontinence and shortness of breath. EXAM: ABDOMEN - 1 VIEW COMPARISON:  None. FINDINGS: No abnormal bowel distension identified. There is a moderate stool burden identified throughout the colon up to the rectum. No free air noted. Bilateral pleural effusions and lower lobe airspace opacities noted within the visualized portions of the chest. Previous ORIF of the left femur. IMPRESSION: 1. Nonobstructive bowel gas pattern. 2. Moderate stool burden within the colon. Electronically Signed   By: Kerby Moors M.D.   On: 04/02/2019 10:07   Recent Labs    04/01/19 0500  WBC 7.0  HGB 8.9*  HCT 31.3*  PLT 330   Recent Labs    04/01/19 0500  NA 140  K 3.6  CL 89*  CO2 42*  GLUCOSE 90  BUN 11  CREATININE 0.62  CALCIUM 8.4*     Physical Exam: BP (!) 132/53   Pulse 92   Temp 98.6 F (37 C)   Resp 20   Wt 89.4 kg   SpO2 97%   BMI 33.83 kg/m  Constitutional: No distress . Vital signs reviewed.  Morbidly obese. HENT: Normocephalic.  Atraumatic. Eyes: EOMI. No discharge. Cardiovascular: No JVD. Respiratory: Accessory muscles for breathing.  + Expiratory wheezes.  + Meigs. GI: Non-distended. Skin: Warm and dry.  Intact. Psych: Normal mood.  Normal behavior. Musc: No edema in extremities.  No tenderness in extremities. Neuro: Alert Motor: Bilateral upper extremities: 5/5 proximal distal Left lower extremity: Hip flexion, knee extension 4-/5, ankle dorsiflexion 5/5, unchanged Right lower extremity: Hip flexion, knee extension 4-/5, ankle dorsiflexion 5/5, unchanged  Assessment/Plan: 1. Functional deficits secondary to debility which require 3+ hours per day of interdisciplinary therapy in a comprehensive inpatient rehab setting.  Physiatrist is providing close team supervision and 24 hour management of active medical problems listed below.  Physiatrist and rehab team continue to assess barriers to discharge/monitor patient progress toward functional and medical goals  Care Tool:  Bathing    Body parts bathed by patient: Right arm, Left arm, Chest, Abdomen, Face   Body parts bathed by helper: Front perineal area, Buttocks, Right upper leg, Left upper leg, Right lower leg, Left lower leg     Bathing assist Assist Level: Moderate Assistance - Patient 50 - 74%     Upper Body Dressing/Undressing Upper body dressing   What is the patient wearing?: Pull over shirt    Upper body assist Assist Level:  Minimal Assistance - Patient > 75%    Lower Body Dressing/Undressing Lower body dressing      What is the patient wearing?: Incontinence brief     Lower body assist Assist for lower body dressing: Maximal Assistance - Patient 25 - 49%     Toileting Toileting    Toileting assist Assist for  toileting: Total Assistance - Patient < 25%     Transfers Chair/bed transfer  Transfers assist     Chair/bed transfer assist level: 2 Helpers     Locomotion Ambulation   Ambulation assist      Assist level: Minimal Assistance - Patient > 75% Assistive device: Walker-rolling Max distance: 3'   Walk 10 feet activity   Assist  Walk 10 feet activity did not occur: Safety/medical concerns        Walk 50 feet activity   Assist Walk 50 feet with 2 turns activity did not occur: Safety/medical concerns         Walk 150 feet activity   Assist Walk 150 feet activity did not occur: Safety/medical concerns         Walk 10 feet on uneven surface  activity   Assist Walk 10 feet on uneven surfaces activity did not occur: Safety/medical concerns         Wheelchair     Assist Will patient use wheelchair at discharge?: (TBD)   Wheelchair activity did not occur: Safety/medical concerns  Wheelchair assist level: Minimal Assistance - Patient > 75% Max wheelchair distance: 50 feet    Wheelchair 50 feet with 2 turns activity    Assist    Wheelchair 50 feet with 2 turns activity did not occur: Safety/medical concerns       Wheelchair 150 feet activity     Assist Wheelchair 150 feet activity did not occur: Safety/medical concerns          Medical Problem List and Plan: 1. Debility secondary to HCAP  Continue CIR 2. Antithrombotics: -DVT/anticoagulation:Pharmaceutical:Lovenox -antiplatelet therapy: N/A 3.Neuropathy/chornic pain/Pain Management: [place]rans patch daily. On cymbalta. Continue low dose oxycodone prn--monitor for sedation  Controlled on 2/4  Monitor with increased mobility 4. Mood:LCSW to follow for evaluation and support. -antipsychotic agents: N/A 5. Neuropsych: This patientiscapable of making decisions onherown behalf. 6. Skin/Wound Care:Monitor incision daily for healing 7.  Fluids/Electrolytes/Nutrition:Offer supplement during the day.Advised patient to have family bring in food from home to help with intake. 8. Aspiration [id number] day course of cefepime on 1/31.  Transitioned to bactrim M/W/F for prophylaxis. 9. PAF with RVR: Monitor HR --continue metoprolol and ASA.   ECG on 1/29 showing A. fib with RVR, ECG remains pending on 2/4, ordered again  Monitor with increased activity 10. Left femur fracture s/p IM nailing: WBAT- lovenox through 04/07/2019 11. History of Chronic Hypotension:Continue midodrinetid.  Controlled on 2/3  Monitor with increased mobility 12. Autoimmune hemolytic anemia: On chronic steroids and bactrim for prophylaxis  Hemoglobin 8.9 on 2/2, labs ordered for tomorrow  Will also order vitamin B12/folate for tomorrow 13. OSA: Needs to use CPAP at nights and whenever napping.  14.Obstructive lung disease/Chronic bronchitis:Continue prednisone, home dose of 5 mg of prednisone per day  Confounded by narcotics  Tapering steroids to home dose  Supplemental oxygen dependent at baseline  Respiratory status stable on Dulera bid.   Schedule nebulizers  CXR on 1/31 personally reviewed, worsening, repeat CXR personally reviewed-again worsening on 2/3.  Procalcitonin within normal limits.  Discussed with respiratory therapy BiPAP, encourage patient to bring in home device  with special mask  ABG ordered with critical value carbon dioxide.    Discussed with pulmonary-appreciate recs, recommending diuresis.  Palliative care consult ordered 15.Acute onChronic diastolic CHF: Monitor for signs and symptoms of fluid overload.Now on 1500 cc FR.  Continue low dose metoprolol   Lasix 60 mg daily (was on 40 mg bid PTA?). Heart healthy diet.  Filed Weights   04/01/19 0400 04/02/19 0500 04/03/19 0603  Weight: 96.3 kg 92.3 kg 89.4 kg   Improving on 2/4 16. Left apical lung nodule: Follow up CT in 12 months recommended. 17.  Drug-induced  constipation:   KUB personally reviewed, suggesting constipation  Will consider Movantik  Bowel meds increased 18.  Hypoalbuminemia  Supplement initiated on 2/2 19. Urinary incontinence  PVRs relatively unremarkable  Flomax started on 2/4 20.  Dysphagia  D3 nectars, advance as tolerated  LOS: 3 days A FACE TO FACE EVALUATION WAS PERFORMED  Lavona Norsworthy Lorie Phenix 04/03/2019, 8:22 AM

## 2019-04-03 NOTE — Progress Notes (Signed)
Physical Therapy Session Note  Patient Details  Name: Virginia Thompson MRN: [id number] Date of Birth: 13-Mar-1928  Today's Date: 04/03/2019 PT Individual Time: 1100-1200 PT Individual Time Calculation (min): 60 min   Short Term Goals: Week 1:  PT Short Term Goal 1 (Week 1): Pt will perform bed mobility w/ mod assist PT Short Term Goal 2 (Week 1): Pt will perform sit<>stand w/ min assist PT Short Term Goal 3 (Week 1): Pt will ambulate 15' w/ LRAD w/ min assist PT Short Term Goal 4 (Week 1): Pt will tolerate 60 min of upright activity w/ minimal increase in fatigue  Skilled Therapeutic Interventions/Progress Updates:   Received pt supine in bed, pt agreeable to therapy, and did not c/o pain during session. Pt reported she did not sleep well last night. Pt on 3L O2 via [place] with O2 sat at 92%. Therapist consulted with RN regarding pt's current breathing status as well as with Pam (PA). Per PA, pt to be on 2L O2  At all times with O2 sat around 88%. Upon return to room, daughter present at bedside and respiratory therapy providing breathing treatment. While patient received breathing treatment therapist discussed with pt's daughter pt's CLOF, increased fatigue levels, and mild confusion. Pt's daughter stated "she was much better a few days ago". Therapist discussed PLOF and assistance level and home setup for discharge. Therapist educated daughter on breathing techniques and activities to engage pt outside of therapy. Session focused on functional mobility/transfers, LE strength, dynamic standing balance, and improved endurance with activity. Therapist decreased O2 to 2L with O2 sat at 91% at rest. Pt donned pants max A (with assistance from daughter). Pt able to bridge slightly for therapist and daughter to pull up pants but had to roll to L and R mod A with use of bedrails to fully get pants over hips. O2 sat 81% after rolling increasing to 88% after 3 minute supine rest break with education for pursed lip  breathing. When therapist asked pt where she was pt reported "[facility]", when asked month pt stated "Tuesday", however was able to state "21" when asked year. Pt able to be reoriented easily. Therapist educated daughter on pursed lip breathing techniques and importance of use of flutter value for respiratory function; daughter verbalized understanding. Pt performed pursed lip breathing exercises 2x10 with verbal cues for technique. Pt transferred L sidelying mod A with use of bedrail and verbal cues for technique. Pt required extensive rest break in L sidelying prior to transferring L sidelying<>sitting EOB with max A for LE management and trunk control. Pt required bilateral UE support for static sitting balance with CGA/supervision. O2 sat 89% on 2L O2 seated EOB. Pt transferred sit<>stand with RW max A and stand<>pivot with RW min A +2 from daughter. Pt required extensive time to compete transfer and max verbal cues for step sequence and turning. Pt able to scoot hips back in WC with CGA and max verbal cues. Concluded session with pt sitting in WC, needs within reach, seatbelt alarm on. Daughter present and NT at bedside.   Therapy Documentation Precautions:  Precautions Precautions: Fall Restrictions Weight Bearing Restrictions: No LLE Weight Bearing: Weight bearing as tolerated  Therapy/Group: Individual Therapy Alfonse Alpers PT, DPT   04/03/2019, 7:40 AM

## 2019-04-03 NOTE — Progress Notes (Signed)
Patient is to be kept at 2L of oxygen via nasal cannula continous, Per PA. No s/s of distress noted at this time.  Audie Clear, LPN

## 2019-04-03 NOTE — Progress Notes (Signed)
Modified Barium Swallow Progress Note  Patient Details  Name: Virginia Thompson MRN: [id number] Date of Birth: May 26, 1928  Today's Date: 04/03/2019  Modified Barium Swallow completed.  Full report located under Chart Review in the Imaging Section.  Brief recommendations include the following:  Clinical Impression  Pt presents with overall moderate level of fatigue and was on 3liters O2 via nasal cannula. Pt presents with mild oral phase dysphagia c/b decreased lingual manipulation and bolus cohesion of thin liquids and dysphagia 3. Pt's extended oral phase when consuming dysphagia 3 is likely combination of the appearance dry oral cavity. This patient is known to Probation officer and at bedside she consumes dyspahgia 3 without any oral phase difficulties.  Pt has developed compensatory strategy of achieving/establishing airway closure prior to swallow initiation across all trials. She maintains airway closure during swallow. With use of this strategy she may experience increased work of breathing during PO consumption. Given pt's ability at bedside and fact that she has been consuming dysphagai 3 diet for ~ 1 week, recommend she continue dysphagia 3, thin liquids and medicine whole in puree.   Swallow Evaluation Recommendations       SLP Diet Recommendations: Dysphagia 3 (Mech soft) solids;Thin liquid   Liquid Administration via: Cup;Straw   Medication Administration: Whole meds with puree   Supervision: Patient able to self feed   Compensations: slow rate (pt implements herself d/t overall level of fatigue)   Postural Changes: Seated upright at 90 degrees   Oral Care Recommendations: Oral care BID        Virginia Thompson 04/03/2019,12:56 PM

## 2019-04-03 NOTE — Progress Notes (Signed)
Physical Therapy Session Note  Patient Details  Name: Virginia Thompson MRN: [id number] Date of Birth: [birth year]  Today's Date: 04/03/2019 PT Individual Time: [id number] PT Individual Time Calculation (min): 27 min   Short Term Goals: Week 1:  PT Short Term Goal 1 (Week 1): Pt will perform bed mobility w/ mod assist PT Short Term Goal 2 (Week 1): Pt will perform sit<>stand w/ min assist PT Short Term Goal 3 (Week 1): Pt will ambulate 15' w/ LRAD w/ min assist PT Short Term Goal 4 (Week 1): Pt will tolerate 60 min of upright activity w/ minimal increase in fatigue  Skilled Therapeutic Interventions/Progress Updates:     Patient in w/c on 2L/min, SPO2 93%, with her daughter in the room upon PT arrival. Patient alert and agreeable to PT session. Patient denied pain during session.  Therapeutic Activity: Bed Mobility: Patient performed sit to supine with min A for L LE management. She performed scooting up in bed with min A x2 and total A +2 x1 due to L hip discomfort. O2 sats dropped to 80% with patient lying in supine, recovered to 90% in semi-fowlers with HOB elevated, RN made aware. Transfers: Patient performed sit to/from stand x1 and stand pivot x1 with CGA using a RW. Provided verbal cues for hand placement, forward weight shift, and placing L foot ahead to off-weight L LE for pain tolerance.  Provided education on positioning and monitoring of O2 sats. Discussed general hip fracture recovery and patient's change in cognition with the patient and her daughter throughout session.   Patient in bed with her daughter at bed side at end of session with breaks locked, bed alarm set, and all needs within reach.    Therapy Documentation Precautions:  Precautions Precautions: Fall Restrictions Weight Bearing Restrictions: No LLE Weight Bearing: Weight bearing as tolerated    Therapy/Group: Individual Therapy  Laiklynn Raczynski L Imogine Carvell PT, DPT  04/03/2019, 6:08 PM

## 2019-04-03 NOTE — Progress Notes (Signed)
Occupational Therapy Session Note  Patient Details  Name: GEARLDENE MORCOM MRN: [id number] Date of Birth: 1928/10/31  Today's Date: 04/03/2019 OT Individual Time: 1300-1415 OT Individual Time Calculation (min): 75 min    Short Term Goals: Week 1:  OT Short Term Goal 1 (Week 1): Pt will complete bathing with min assist at sit > stand level OT Short Term Goal 2 (Week 1): Pt will complete LB dressing with mod assist (including socks/shoes) OT Short Term Goal 3 (Week 1): Pt will complete toilet transfer with Min assist OT Short Term Goal 4 (Week 1): Pt will complete 1 grooming task in standing to demonstrate increased standing tolerance Week 2:     Skilled Therapeutic Interventions/Progress Updates:    1:1 Pt up in w/c with daughter present. Self care retraining at shower level. Pt remained on 2 liters of O2  During session. Pt able to transfer stand steps to the [facility] in the shower (facing outward) with min A but with more than reasonable amt of time and max cues for sequencing. Pt able to shower with mod A. Pt was incontinent of bowel twice in the shower and the first time unaware. Pt was able to dress shirt with setup. Brief threaded with total A., Pt able to to perform sit to stands from elevated BSC with min guard with extra time. Transferred out of shower stall with min A with extra time with RW. Introduced Customer service manager to thread pants and was successful. Pt able to come into standing with min A and stand without UE support and assist with pulling up her pants.  Left sitting in the w/c with her daughter with safety belt on.    Therapy Documentation Precautions:  Precautions Precautions: Fall Restrictions Weight Bearing Restrictions: No LLE Weight Bearing: Weight bearing as tolerated Pain:  no c/o pain    Therapy/Group: Individual Therapy  Willeen Cass [facility] 04/03/2019, 3:50 PM

## 2019-04-03 NOTE — Progress Notes (Addendum)
Attending Attestation  Seen and examined with NP, agree with his plan as written.  Seen for multifactorial chronic respiratory failure.  Feels okay this AM but mouth is dry.  On exam, basilar rhonci persist.  Remains frail appearing and weak.  SLP recs noted.    A/P: Chronic hypoxemic and hypercapneic respiratory failure.  No evidence of infection.  Combination interstitial edema, deconditioning, chronic aspiration syndrome, restrictive chest wall disease.  Fine to switch to PO lasix.  I have Abiquiu the bactrim ppx dose, this does not work for aspiration and is dosing for PJP when patient on high dose steroids which she is tapering off (on home 5mg /day, national recommendations are for bactrim only when on multi-immunosuppressant regimens or prednisone > 20mg /day).  This medicine has a number of side effects we want to avoid.  Overall, I would strongly consider palliative care involvement given appearance and clinical trajectory.  Please call if we can be of further help.  Erskine Emery MD PCCM      NAME:  Virginia Thompson, MRN:  [id number], DOB:  10-Dec-1928, LOS: 3 ADMISSION DATE:  03/31/2019, CONSULTATION DATE:  04/02/19 REFERRING MD:  Posey Pronto - PMR, CHIEF COMPLAINT:  Debility, hypoxia  Brief History   86yo F recently admitted to [place] after hospitalization for acute respiratory failure requiring intubation in setting of debility following hip fx.   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 Was admitted to [facility] 1/25 for resp failure, intubated, extubated 1/26, and remained on hospitalist service until 2/1 discharge to [place].   2/3 PCCM engaged for CXR findings: increased interstitial and airspace opacities.   Past Medical History  OSA Chronic  bronchitis CHF HTN Lymphoma Autoimmune hemolytic anemia  Significant Hospital Events     Consults:  PCCM  Procedures:    Significant Diagnostic Tests:  CXR 2/3> interstitial and airspace opacities   Micro Data:    Antimicrobials:  Cefepime 7 day course, end 1/31 Bactrim M/W/F 2/1>   Interim history/subjective:  [place] female who reports having yellow sputum  Objective   Blood pressure (!) 132/53, pulse 92, temperature 98.6 F (37 C), resp. rate 20, weight 89.4 kg, SpO2 97 %.    FiO2 (%):  [32 %] 32 %   Intake/Output Summary (Last 24 hours) at 04/03/2019 0957 Last data filed at 04/03/2019 0900 Gross per 24 hour  Intake 180 ml  Output --  Net 180 ml   Filed Weights   04/01/19 0400 04/02/19 0500 04/03/19 0603  Weight: 96.3 kg 92.3 kg 89.4 kg    Examination: General: Frail 86-year-old female who is confused to time.  Unable to keep CPAP on at night HEENT: No JVD or lymphadenopathy is appreciated Neuro: Periods of confusion. CV: Heart sounds are distant PULM: Decreased breath sounds in the bases with mild rhonchi  GI: soft, bsx4 active  Extremities: Left hip site unremarkable Skin: no rashes or lesions   Resolved Hospital Problem list     Assessment & Plan:   Acute on chronic respiratory insufficiency with hypercarbia and hypoxia  Hx baseline 2-3L O2 use, OSA on CPAP, Chronic bronchitis, Aspiration ABG reveals hypercarbia with normal pH Chest x-ray with bilateral airspace disease Hypoventilated Component of kyphosis mechanical restriction  P Nocturnal CPAP Flutter valve incentive ordered  Diuresis as tolerated Procalcitonin is unremarkable She noted to have generalized failure to thrive in 86-year-old female status post left hip repair To new antimicrobial therapy Mobilize as tolerated Bronchodilators Her underlying weakness is a major factor    Best practice:  Diet: Dys 3 Pain/Anxiety/Delirium protocol (if indicated): APAP, Oxycodone VAP  protocol (if indicated): na DVT prophylaxis: lovenox  GI prophylaxis: protonix Glucose control: ssi Mobility: PT Code Status: Full  Family Communication: Patient updated at bedside 04/03/2019 Disposition: CPIS   Labs   CBC: Recent Labs  Lab 03/28/19 0530 03/31/19 0500 04/01/19 0500  WBC 6.8 6.3 7.0  NEUTROABS  --   --  4.5  HGB 9.2* 8.5* 8.9*  HCT 31.7* 29.8* 31.3*  MCV 103.9* 107.2* 107.2*  PLT 299 313 [id number]    Basic Metabolic Panel: Recent Labs  Lab 03/29/19 0306 03/30/19 0444 03/31/19 0500 03/31/19 1832 04/01/19 0500  NA 138 139 139  --  140  K 3.4* 3.7 3.9  --  3.6  CL 93* 93* 92*  --  89*  CO2 39* 37* 40*  --  42*  GLUCOSE 100* 105* 120*  --  90  BUN 15 9 13   --  11  CREATININE 0.48 0.56 0.70  --  0.62  CALCIUM 8.3* 8.3* 8.5*  --  8.4*  MG 2.3 2.3  --  2.2  --    GFR: Estimated Creatinine Clearance: 52.6 mL/min (by C-G formula based on SCr of 0.62 mg/dL). Recent Labs  Lab 03/28/19 0530 03/31/19 0500 04/01/19 0500 04/02/19 1722 04/03/19 0511  PROCALCITON  --   --   --  <0.10 <0.10  WBC 6.8 6.3 7.0  --   --     Liver Function Tests: Recent Labs  Lab 04/01/19 0500  AST 28  ALT 35  ALKPHOS 92  BILITOT 0.4  PROT 5.7*  ALBUMIN 2.7*   No results for input(s): LIPASE, AMYLASE in the last 168 hours. No results for input(s): AMMONIA in the last 168 hours.  ABG    Component Value Date/Time   PHART 7.347 (L) 04/02/2019 1207   PCO2ART 86.9 (HH) 04/02/2019 1207   PO2ART 64.3 (L) 04/02/2019 1207   HCO3 46.4 (H) 04/02/2019 1207   TCO2 43 (H) 03/24/2019 0541   O2SAT 91.1 04/02/2019 1207     Coagulation Profile: No results for input(s): INR, PROTIME in the last 168 hours.  Cardiac Enzymes: No results for input(s): CKTOTAL, CKMB, CKMBINDEX, TROPONINI in the last 168 hours.  HbA1C: No results found for: HGBA1C  CBG: Recent Labs  Lab 04/01/19 0546 04/02/19 1224 04/02/19 1640 04/02/19 2255 04/03/19 0559  GLUCAP 67* 131* 149* 116* 83     Steve Minor ACNP Acute Care Nurse Practitioner [place] Please consult Amion 04/03/2019, 9:57 AM

## 2019-04-04 ENCOUNTER — Inpatient Hospital Stay (HOSPITAL_COMMUNITY): Payer: Medicare Other | Admitting: Occupational Therapy

## 2019-04-04 ENCOUNTER — Inpatient Hospital Stay (HOSPITAL_COMMUNITY): Payer: Medicare Other

## 2019-04-04 DIAGNOSIS — R739 Hyperglycemia, unspecified: Secondary | ICD-10-CM

## 2019-04-04 DIAGNOSIS — T380X5A Adverse effect of glucocorticoids and synthetic analogues, initial encounter: Secondary | ICD-10-CM

## 2019-04-04 DIAGNOSIS — R1311 Dysphagia, oral phase: Secondary | ICD-10-CM

## 2019-04-04 DIAGNOSIS — Z515 Encounter for palliative care: Secondary | ICD-10-CM | POA: Insufficient documentation

## 2019-04-04 LAB — CBC WITH DIFFERENTIAL/PLATELET
Abs Immature Granulocytes: 0.02 10*3/uL (ref 0.00–0.07)
Basophils Absolute: 0 10*3/uL (ref 0.0–0.1)
Basophils Relative: 0 %
Eosinophils Absolute: 0 10*3/uL (ref 0.0–0.5)
Eosinophils Relative: 0 %
HCT: 33.4 % — ABNORMAL LOW (ref 36.0–46.0)
Hemoglobin: 9.4 g/dL — ABNORMAL LOW (ref 12.0–15.0)
Immature Granulocytes: 0 %
Lymphocytes Relative: 22 %
Lymphs Abs: 1.5 10*3/uL (ref 0.7–4.0)
MCH: 30.3 pg (ref 26.0–34.0)
MCHC: 28.1 g/dL — ABNORMAL LOW (ref 30.0–36.0)
MCV: 107.7 fL — ABNORMAL HIGH (ref 80.0–100.0)
Monocytes Absolute: 0.8 10*3/uL (ref 0.1–1.0)
Monocytes Relative: 13 %
Neutro Abs: 4.2 10*3/uL (ref 1.7–7.7)
Neutrophils Relative %: 65 %
Platelets: 356 10*3/uL (ref 150–400)
RBC: 3.1 MIL/uL — ABNORMAL LOW (ref 3.87–5.11)
RDW: 15.4 % (ref 11.5–15.5)
WBC: 6.5 10*3/uL (ref 4.0–10.5)
nRBC: 1.2 % — ABNORMAL HIGH (ref 0.0–0.2)

## 2019-04-04 LAB — GLUCOSE, CAPILLARY
Glucose-Capillary: 98 mg/dL (ref 70–99)
Glucose-Capillary: 157 mg/dL — ABNORMAL HIGH (ref 70–99)
Glucose-Capillary: 151 mg/dL — ABNORMAL HIGH (ref 70–99)
Glucose-Capillary: 106 mg/dL — ABNORMAL HIGH (ref 70–99)

## 2019-04-04 LAB — PROCALCITONIN: Procalcitonin: <0.1 ng/mL

## 2019-04-04 LAB — VITAMIN B12: Vitamin B-12: 751 pg/mL (ref 180–914)

## 2019-04-04 MED ORDER — SULFAMETHOXAZOLE-TRIMETHOPRIM 800-160 MG PO TABS
1.0000 | ORAL_TABLET | ORAL | Status: DC
  Administered 2019-04-04: 10:00:00 1 via ORAL
  Filled 2019-04-04: qty 1

## 2019-04-04 NOTE — Progress Notes (Signed)
Physical Therapy Session Note  Patient Details  Name: Virginia Thompson MRN: [id number] Date of Birth: [birth year]  Today's Date: 04/04/2019 PT Individual Time: 0800-0915 PT Individual Time Calculation (min): 75 min   Short Term Goals: Week 1:  PT Short Term Goal 1 (Week 1): Pt will perform bed mobility w/ mod assist PT Short Term Goal 2 (Week 1): Pt will perform sit<>stand w/ min assist PT Short Term Goal 3 (Week 1): Pt will ambulate 15' w/ LRAD w/ min assist PT Short Term Goal 4 (Week 1): Pt will tolerate 60 min of upright activity w/ minimal increase in fatigue  Skilled Therapeutic Interventions/Progress Updates:   Received pt supine in bed about to eat breakfast, pt agreeable to therapy, and reported pain in L LE but was unable to give pain number, RN made aware. Pt on 2L O2 via [place] throughout session per PA orders. Session focused on functional mobility/transfers, LE strength, dynamic standing balance, ambulation, coordination, and improved activity tolerance. At rest O2 sat 89% on 2L O2. Pt demonstrated labored breathing and required verbal cues for pursed lip breathing techniques; however pt with poor carry over. Therapist provided min A with eating for time management purposes. Pt ate a poptart, a few bites of applesauce, and drank a few sips of coffee and water. Pt required cues to completely chew food, swallow, and suck through the straw when drinking rather than blowing out. Pt rolled to L and R mod A with use of bedrails and required max A to pull pants over hips. O2 sat 71% after rolling increasing to 82% with 3 minutes supine rest break. Pt transferred L sidelying<>sitting EOB max A for LE management and trunk control. Pt required bilateral UE support to maintain static sitting balance with supervision. O2 sat 89% sitting EOB. Pt required max A to scoot hips to EOB for feet to rest on floor. Pt transferred bed<>WC stand<>pivot with RW min A. Pt required verbal cues for hand placement and step  sequence when turning. Pt required min A to scoot hips back in WC. Pt performed WC mobility with bilateral UE and min A for 48ft. Pt required hand over hand guidance however, demonstrated poor carry over. Therapist transported pt to gym remainder of way total assist. O2 sat 89% and pt denied any SOB. Pt transferred sit<>stand with RW mod A. Pt ambulated 6ft with RW min A with verbal cues for RW and stepping sequence. Concluded session wit pt sitting in WC, needs within reach, and seatbelt alarm on. O2 sat 88% on 2L O2 at end of session.   Therapy Documentation Precautions:  Precautions Precautions: Fall Restrictions Weight Bearing Restrictions: No LLE Weight Bearing: Weight bearing as tolerated   Therapy/Group: Individual Therapy Alfonse Alpers PT, DPT   04/04/2019, 7:31 AM

## 2019-04-04 NOTE — Progress Notes (Signed)
Occupational Therapy Session Note  Patient Details  Name: Virginia Thompson MRN: [id number] Date of Birth: 1928-04-20  Today's Date: 04/04/2019 OT Individual Time: 1000-1100 OT Individual Time Calculation (min): 60 min    Short Term Goals: Week 1:  OT Short Term Goal 1 (Week 1): Pt will complete bathing with min assist at sit > stand level OT Short Term Goal 2 (Week 1): Pt will complete LB dressing with mod assist (including socks/shoes) OT Short Term Goal 3 (Week 1): Pt will complete toilet transfer with Min assist OT Short Term Goal 4 (Week 1): Pt will complete 1 grooming task in standing to demonstrate increased standing tolerance  Skilled Therapeutic Interventions/Progress Updates:    Patient in bed, cues to stay alert.  2L O2 via [place] t/o session.  Supine to sitting with mod A.  She is able to scoot with min A.  She tolerated unsupported sitting with CS for 10 minutes with orientation activity.  Sit to stand with mod A - she is able to maintain standing with CGA, cues for weight shift and stepping for pivot transfer.  Oral care seated at sink with moderate cues for sequencing and min A.  Completed ball rolling and towel slides at raised mat with cues to continue with task.  SPT w/c to bed with mod A.  Sitting to supine with max a.  Bed alarm set and call bell in reach at close of session.    Therapy Documentation Precautions:  Precautions Precautions: Fall Restrictions Weight Bearing Restrictions: No LLE Weight Bearing: Weight bearing as tolerated General:   Vital Signs:  Pain: Pain Assessment Pain Scale: 0-10 Pain Score: 1  PAINAD (Pain Assessment in Advanced Dementia) Breathing: normal Negative Vocalization: none Body Language: relaxed   Other Treatments:     Therapy/Group: Individual Therapy  Carlos Levering 04/04/2019, 12:06 PM

## 2019-04-04 NOTE — Progress Notes (Signed)
Virginia Thompson PHYSICAL MEDICINE & REHABILITATION PROGRESS NOTE  Subjective/Complaints: Patient seen sitting up in bed this AM.  She states she slept well overnight; she slept fairly per sleep chart. She was noted to be removing her Bipap, but she does not recall this. She was seen by Pulm - bactrim d/ced.   ROS: Denies CP, shortness of breath, nausea, vomiting, diarrhea.  Objective: Vital Signs: Blood pressure (!) 102/51, pulse 89, temperature 98.1 F (36.7 C), temperature source Oral, resp. rate (!) 21, weight 87.4 kg, SpO2 92 %. DG Chest 2 View  Result Date: 04/02/2019 CLINICAL DATA:  Fecal incontinence and shortness of breath. EXAM: CHEST - 2 VIEW COMPARISON:  03/30/2019 FINDINGS: Right-sided PICC line is noted with tip projecting over the cavoatrial junction. Mild cardiac enlargement and aortic atherosclerosis. Bilateral upper and lower lung zone interstitial and airspace opacities are again noted. The aeration to lungs is not significantly changed in the interval. IMPRESSION: Persistent bilateral upper and lower lung zone airspace opacities compatible with multifocal infection. Electronically Signed   By: Kerby Moors M.D.   On: 04/02/2019 10:11   DG Abd 1 View  Result Date: 04/02/2019 CLINICAL DATA:  Fecal incontinence and shortness of breath. EXAM: ABDOMEN - 1 VIEW COMPARISON:  None. FINDINGS: No abnormal bowel distension identified. There is a moderate stool burden identified throughout the colon up to the rectum. No free air noted. Bilateral pleural effusions and lower lobe airspace opacities noted within the visualized portions of the chest. Previous ORIF of the left femur. IMPRESSION: 1. Nonobstructive bowel gas pattern. 2. Moderate stool burden within the colon. Electronically Signed   By: Kerby Moors M.D.   On: 04/02/2019 10:07   DG Swallowing Func-Speech Pathology  Result Date: 04/03/2019 Objective Swallowing Evaluation: Type of Study: MBS-Modified Barium Swallow Study  Patient  Details Name: Virginia Thompson MRN: [id number] Date of Birth: 1928/06/19 Today's Date: 04/03/2019 Time: SLP Start Time (ACUTE ONLY): [id number] -SLP Stop Time (ACUTE ONLY): 1104 SLP Time Calculation (min) (ACUTE ONLY): 32 min Past Medical History: Past Medical History: Diagnosis Date . Blood transfusion without reported diagnosis  . Cancer ([place])  . CHF (congestive heart failure) ([place])  . Hypertension  . Hypertension  . Lymphoma ([place])  . Sleep apnea  Past Surgical History: Past Surgical History: Procedure Laterality Date . ABDOMINAL HYSTERECTOMY   . APPENDECTOMY   . BACK SURGERY   . BREAST CYST ASPIRATION Bilateral  . CHOLECYSTECTOMY   . EYE SURGERY   . INTRAMEDULLARY (IM) NAIL INTERTROCHANTERIC Left 03/07/2019  Procedure: INTRAMEDULLARY (IM) NAIL INTERTROCHANTRIC;  Surgeon: Altamese [place]ard[place], MD;  Location: [facility];  Service: Orthopedics;  Laterality: Left; . JOINT REPLACEMENT Left  . SPLENECTOMY   . 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  Patient was febrile and had worsening respiratory failure so intubated in the ED. Covid-19 negative  Subjective: Pt resting in bed. No family present. RN present intermittently Assessment / Plan / Recommendation CHL IP CLINICAL IMPRESSIONS 04/03/2019 Clinical Impression Pt presents with overall moderate level of fatigue and was on 3liters O2 via nasal cannula. Pt presents with mild oral phase dysphagia c/b decreased lingual manipulation and bolus cohesion of thin liquids and dysphagia 3. Pt's extended oral phase when consuming dysphagia 3 is likely combination of the appearance dry oral cavity. This patient is known to Probation officer and  at bedside she consumes dyspahgia 3 without any oral phase difficulties.  Pt has developed compensatory strategy of achieving/establishing airway closure prior to  swallow initiation across all trials. She maintains airway closure during swallow. With use of this strategy she may experience increased work of breathing during PO consumption. Given pt's ability at bedside and fact that she has been consuming dysphagai 3 diet for ~ 1 week, recommend she continue dysphagia 3, thin liquids and medicine whole in puree. SLP Visit Diagnosis Dysphagia, unspecified (R13.10) Attention and concentration deficit following -- Frontal lobe and executive function deficit following -- Impact on safety and function Mild aspiration risk   CHL IP TREATMENT RECOMMENDATION 04/03/2019 Treatment Recommendations Therapy as outlined in treatment plan below   Prognosis 03/25/2019 Prognosis for Safe Diet Advancement Good Barriers to Reach Goals -- Barriers/Prognosis Comment -- CHL IP DIET RECOMMENDATION 04/03/2019 SLP Diet Recommendations Dysphagia 3 (Mech soft) solids;Thin liquid Liquid Administration via -- Medication Administration -- Compensations (No Data) Postural Changes --   CHL IP OTHER RECOMMENDATIONS 04/03/2019 Recommended Consults -- Oral Care Recommendations Oral care BID Other Recommendations --   CHL IP FOLLOW UP RECOMMENDATIONS 03/28/2019 Follow up Recommendations None   CHL IP FREQUENCY AND DURATION 03/25/2019 Speech Therapy Frequency (ACUTE ONLY) min 2x/week Treatment Duration 2 weeks;1 week      CHL IP ORAL PHASE 04/03/2019 Oral Phase Impaired Oral - Pudding Teaspoon -- Oral - Pudding Cup -- Oral - Honey Teaspoon -- Oral - Honey Cup -- Oral - Nectar Teaspoon -- Oral - Nectar Cup WFL Oral - Nectar Straw -- Oral - Thin Teaspoon -- Oral - Thin Cup Weak lingual manipulation;Decreased bolus cohesion Oral - Thin Straw Weak lingual manipulation;Decreased bolus cohesion Oral - Puree WFL Oral - Mech Soft Weak lingual manipulation;Decreased bolus cohesion;Reduced posterior propulsion;Impaired mastication Oral - Regular -- Oral - Multi-Consistency -- Oral - Pill Other (Comment);WFL Oral Phase - Comment --   CHL IP PHARYNGEAL PHASE 04/03/2019 Pharyngeal Phase WFL Pharyngeal- Pudding Teaspoon -- Pharyngeal -- Pharyngeal- Pudding Cup -- Pharyngeal -- Pharyngeal- Honey Teaspoon -- Pharyngeal -- Pharyngeal- Honey Cup -- Pharyngeal -- Pharyngeal- Nectar Teaspoon -- Pharyngeal -- Pharyngeal- Nectar Cup -- Pharyngeal -- Pharyngeal- Nectar Straw -- Pharyngeal -- Pharyngeal- Thin Teaspoon -- Pharyngeal -- Pharyngeal- Thin Cup -- Pharyngeal -- Pharyngeal- Thin Straw -- Pharyngeal -- Pharyngeal- Puree -- Pharyngeal -- Pharyngeal- Mechanical Soft -- Pharyngeal -- Pharyngeal- Regular -- Pharyngeal -- Pharyngeal- Multi-consistency -- Pharyngeal -- Pharyngeal- Pill -- Pharyngeal -- Pharyngeal Comment --  CHL IP CERVICAL ESOPHAGEAL PHASE 04/03/2019 Cervical Esophageal Phase WFL Pudding Teaspoon -- Pudding Cup -- Honey Teaspoon -- Honey Cup -- Nectar Teaspoon -- Nectar Cup -- Nectar Straw -- Thin Teaspoon -- Thin Cup -- Thin Straw -- Puree -- Mechanical Soft -- Regular -- Multi-consistency -- Pill -- Cervical Esophageal Comment -- Happi Overton 04/03/2019, 12:57 PM              DG FEMUR PORT MIN 2 VIEWS LEFT  Result Date: 04/03/2019 CLINICAL DATA:  Left femoral fracture status post ORIF EXAM: LEFT FEMUR PORTABLE 2 VIEWS COMPARISON:  03/07/2019 FINDINGS: Frontal and lateral views of the left femur are obtained. The intramedullary rod and multiple proximal screws traversing the comminuted proximal left femoral fracture again noted, without significant change in positioning. No significant callus formation. No evidence of metallic hardware failure or loosening. Distal interlocking screws are unchanged in position. Lateral compartment left knee arthroplasty again noted and stable. There are diffuse vascular calcifications. Remaining soft tissues are unremarkable. IMPRESSION: 1.  Stable postsurgical changes within the left femur. No evidence of metallic hardware failure or loosening. No significant callus formation. Electronically Signed   By:  Randa Ngo M.D.   On: 04/03/2019 10:25   Recent Labs    04/04/19 0350  WBC 6.5  HGB 9.4*  HCT 33.4*  PLT 356   No results for input(s): NA, K, CL, CO2, GLUCOSE, BUN, CREATININE, CALCIUM in the last 72 hours.  Physical Exam: BP (!) 102/51 (BP Location: Left Arm)   Pulse 89   Temp 98.1 F (36.7 C) (Oral)   Resp (!) 21   Wt 87.4 kg   SpO2 92%   BMI 33.07 kg/m  Constitutional: No distress . Vital signs reviewed. Morbidly obese.  HENT: Normocephalic.  Atraumatic. Eyes: EOMI. No discharge. Cardiovascular: No JVD. Respiratory: Accessory muscles for breathing. +[place]. GI: Non-distended. Skin: Warm and dry.  Intact. Psych: Normal mood.  Normal behavior. Musc: No edema in extremities.  No tenderness in extremities. Neuro: Alert Motor: Bilateral upper extremities: 5/5 proximal distal Left lower extremity: Hip flexion, knee extension 4-/5, ankle dorsiflexion 5/5, stable Right lower extremity: Hip flexion, knee extension 4-/5, ankle dorsiflexion 5/5, stable  Assessment/Plan: 1. Functional deficits secondary to debility which require 3+ hours per day of interdisciplinary therapy in a comprehensive inpatient rehab setting.  Physiatrist is providing close team supervision and 24 hour management of active medical problems listed below.  Physiatrist and rehab team continue to assess barriers to discharge/monitor patient progress toward functional and medical goals  Care Tool:  Bathing    Body parts bathed by patient: Right arm, Left arm, Abdomen, Chest, Right upper leg, Left upper leg, Face   Body parts bathed by helper: Right lower leg, Left lower leg, Buttocks, Front perineal area     Bathing assist Assist Level: Moderate Assistance - Patient 50 - 74%     Upper Body Dressing/Undressing Upper body dressing   What is the patient wearing?: Pull over shirt    Upper body assist Assist Level: Supervision/Verbal cueing    Lower Body Dressing/Undressing Lower body dressing       What is the patient wearing?: Pants, Underwear/pull up     Lower body assist Assist for lower body dressing: Moderate Assistance - Patient 50 - 74%     Toileting Toileting Toileting Activity did not occur Landscape architect and hygiene only): (incontient)  Toileting assist Assist for toileting: Total Assistance - Patient < 25%     Transfers Chair/bed transfer  Transfers assist     Chair/bed transfer assist level: Minimal Assistance - Patient > 75%     Locomotion Ambulation   Ambulation assist      Assist level: Minimal Assistance - Patient > 75% Assistive device: Walker-rolling Max distance: 3'   Walk 10 feet activity   Assist  Walk 10 feet activity did not occur: Safety/medical concerns        Walk 50 feet activity   Assist Walk 50 feet with 2 turns activity did not occur: Safety/medical concerns         Walk 150 feet activity   Assist Walk 150 feet activity did not occur: Safety/medical concerns         Walk 10 feet on uneven surface  activity   Assist Walk 10 feet on uneven surfaces activity did not occur: Safety/medical concerns         Wheelchair     Assist Will patient use wheelchair at discharge?: (TBD)   Wheelchair activity did not occur: Safety/medical concerns  Wheelchair assist level: Minimal Assistance - Patient > 75% Max wheelchair distance: 50 feet    Wheelchair 50 feet with 2 turns activity    Assist    Wheelchair 50 feet with 2 turns activity did not occur: Safety/medical concerns       Wheelchair 150 feet activity     Assist Wheelchair 150 feet activity did not occur: Safety/medical concerns          Medical Problem List and Plan: 1. Debility secondary to HCAP  Continue CIR, made 15/7 2. Antithrombotics: -DVT/anticoagulation:Pharmaceutical:Lovenox -antiplatelet therapy: N/A 3.Neuropathy/chornic pain/Pain Management: Butrans patch daily. On cymbalta. Monitor for  sedation  Controlled on 2/5  Monitor with increased mobility 4. Mood:LCSW to follow for evaluation and support. -antipsychotic agents: N/A 5. Neuropsych: This patientiscapable of making decisions onherown behalf. 6. Skin/Wound Care:Monitor incision daily for healing 7. Fluids/Electrolytes/Nutrition:Offer supplement during the day.Advised patient to have family bring in food from home to help with intake.  BMP ordered for Monday 8. Aspiration [id number] day course of cefepime on 1/31.   9. PAF with RVR: Monitor HR --continue metoprolol and ASA.   ECG on 1/29 showing A. fib with RVR, repeat ECG personally reviewed, normal sinus  Monitor with increased activity 10. Left femur fracture s/p IM nailing: WBAT- lovenox through 04/07/2019 11. History of Chronic Hypotension:Continue midodrinetid.  Labile on 2/5  Monitor with increased mobility 12. 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 OSA: Needs to use CPAP at nights and whenever napping.  14.Obstructive lung disease/Chronic bronchitis:Continue prednisone, home dose of 5 mg of prednisone per day  Confounded by narcotics  Tapering steroids to home dose  Supplemental oxygen dependent at baseline  Respiratory status stable on Dulera bid.   Schedule nebulizers  CXR on 1/31 personally reviewed, worsening, repeat CXR personally reviewed-again worsening on 2/3.  Procalcitonin within normal limits.  Discussed with respiratory therapy BiPAP, encourage patient to bring in home device with special mask  ABG ordered with critical value carbon dioxide.    Discussed with pulmonary-appreciate recs, recommending diuresis - signed off.  Palliative care consult ordered, pending 15.Acute onChronic diastolic CHF: Monitor for signs and symptoms of fluid overload.Now on 1500 cc FR.  Continue low dose  metoprolol   Lasix 60 mg daily (was on 40 mg bid PTA?). Heart healthy diet.  Filed Weights   04/02/19 0500 04/03/19 0603 04/04/19 0500  Weight: 92.3 kg 89.4 kg 87.4 kg   Improving on 2/5 16. Left apical lung nodule: Follow up CT in 12 months recommended. 17.  Drug-induced constipation:   KUB personally reviewed, suggesting constipation  Bowel meds increased  Improving 18.  Hypoalbuminemia  Supplement initiated on 2/2 19. Urinary incontinence  PVRs relatively unremarkable  Flomax started on 2/4  ?Improving overall 20.  Dysphagia  D3 thins, advance as tolerated 21. Steroid induced hyperglycemia  Labile on 2/5  LOS: 4 days A FACE TO FACE EVALUATION WAS PERFORMED  Maryellen Dowdle Lorie Phenix 04/04/2019, 8:28 AM

## 2019-04-04 NOTE — Progress Notes (Signed)
Occupational Therapy Session Note  Patient Details  Name: Virginia Thompson MRN: [id number] Date of Birth: 01-19-1929  Today's Date: 04/04/2019 OT Individual Time: [id number] and [id number] OT Individual Time Calculation (min): 34 min and 30 min   Short Term Goals: Week 1:  OT Short Term Goal 1 (Week 1): Pt will complete bathing with min assist at sit > stand level OT Short Term Goal 2 (Week 1): Pt will complete LB dressing with mod assist (including socks/shoes) OT Short Term Goal 3 (Week 1): Pt will complete toilet transfer with Min assist OT Short Term Goal 4 (Week 1): Pt will complete 1 grooming task in standing to demonstrate increased standing tolerance  Skilled Therapeutic Interventions/Progress Updates:    1) Treatment session with focus on initiation and participation in any functional task.  Pt received in bed asleep.  Daughter present and reports pt "sleepy" this afternoon with little intake at lunch.  Max encouragement and multimodal cues for sequencing and initiation during bed mobility to attempt to increase arousal.  Pt completed sit> stand mod assist but unable to initiation any stepping to transfer to w/c.  Max multimodal cues for stepping pattern, with pt "freezing" with inability to step despite increased wait time vs increased cues.  Moved hospital bed to place w/c behind pt.  Max assist to lower to sitting and to scoot hips back in chair.  Pt remained upright in w/c with daughter present to encourage increased arousal and eat some lunch. O2 sats maintained 86-92% on 2L O2  2) Treatment session with focus on standing balance and functional transfers.  Pt no more alert after sitting up, daughter requesting pt to return to bed.  Pt demonstrating increased sit > stand this session with assist for hand placement then pt standing mod assist not requiring as many cues as previous session.  Pt with increased initiation with sidestepping to transfer to bed, therapist maneuvering RW to  facilitate stepping pattern.  Pt unable to sequence or initiate stepping backwards to approach bed, therefore bed brought to her.  While standing, engaged in perineal hygiene and changing incontinence brief with therapist providing min assist for standing and clothing management while 2nd person completing hygiene.  Pt returned to supine total assist +2 and positioned upright in bed.  O2 sats maintained 86-92% on 2L O2.  Therapy Documentation Precautions:  Precautions Precautions: Fall Restrictions Weight Bearing Restrictions: No LLE Weight Bearing: Weight bearing as tolerated General:   Vital Signs: Therapy Vitals Temp: 98.3 F (36.8 C) Pulse Rate: 74 Resp: 20 BP: (!) 155/68 Patient Position (if appropriate): Sitting Oxygen Therapy SpO2: 92 % O2 Device: Nasal Cannula O2 Flow Rate (L/min): 2 L/min Pain: PAINAD (Pain Assessment in Advanced Dementia) Breathing: normal Negative Vocalization: none Body Language: relaxed   Therapy/Group: Individual Therapy  Simonne Come 04/04/2019, 2:55 PM

## 2019-04-05 ENCOUNTER — Inpatient Hospital Stay (HOSPITAL_COMMUNITY)
Admission: AD | Admit: 2019-04-05 | Discharge: 2019-04-28 | DRG: 308 | Disposition: E | Payer: Medicare Other | Source: Intra-hospital | Attending: Internal Medicine | Admitting: Internal Medicine

## 2019-04-05 ENCOUNTER — Inpatient Hospital Stay (HOSPITAL_COMMUNITY): Payer: Medicare Other | Admitting: Physical Therapy

## 2019-04-05 ENCOUNTER — Inpatient Hospital Stay (HOSPITAL_COMMUNITY): Payer: Medicare Other | Admitting: Occupational Therapy

## 2019-04-05 ENCOUNTER — Inpatient Hospital Stay (HOSPITAL_COMMUNITY): Payer: Medicare Other

## 2019-04-05 DIAGNOSIS — Z91048 Other nonmedicinal substance allergy status: Secondary | ICD-10-CM

## 2019-04-05 DIAGNOSIS — I959 Hypotension, unspecified: Secondary | ICD-10-CM | POA: Diagnosis present

## 2019-04-05 DIAGNOSIS — Z66 Do not resuscitate: Secondary | ICD-10-CM | POA: Diagnosis not present

## 2019-04-05 DIAGNOSIS — Z87891 Personal history of nicotine dependence: Secondary | ICD-10-CM | POA: Diagnosis not present

## 2019-04-05 DIAGNOSIS — Z7982 Long term (current) use of aspirin: Secondary | ICD-10-CM

## 2019-04-05 DIAGNOSIS — J690 Pneumonitis due to inhalation of food and vomit: Secondary | ICD-10-CM | POA: Diagnosis present

## 2019-04-05 DIAGNOSIS — D591 Autoimmune hemolytic anemia, unspecified: Secondary | ICD-10-CM | POA: Diagnosis present

## 2019-04-05 DIAGNOSIS — Z9981 Dependence on supplemental oxygen: Secondary | ICD-10-CM | POA: Diagnosis not present

## 2019-04-05 DIAGNOSIS — R0902 Hypoxemia: Secondary | ICD-10-CM

## 2019-04-05 DIAGNOSIS — I110 Hypertensive heart disease with heart failure: Secondary | ICD-10-CM | POA: Diagnosis present

## 2019-04-05 DIAGNOSIS — I4891 Unspecified atrial fibrillation: Principal | ICD-10-CM | POA: Diagnosis present

## 2019-04-05 DIAGNOSIS — I5033 Acute on chronic diastolic (congestive) heart failure: Secondary | ICD-10-CM | POA: Diagnosis present

## 2019-04-05 DIAGNOSIS — J9601 Acute respiratory failure with hypoxia: Secondary | ICD-10-CM | POA: Diagnosis present

## 2019-04-05 DIAGNOSIS — Z90710 Acquired absence of both cervix and uterus: Secondary | ICD-10-CM | POA: Diagnosis not present

## 2019-04-05 DIAGNOSIS — G4733 Obstructive sleep apnea (adult) (pediatric): Secondary | ICD-10-CM | POA: Diagnosis present

## 2019-04-05 DIAGNOSIS — G9341 Metabolic encephalopathy: Secondary | ICD-10-CM | POA: Diagnosis present

## 2019-04-05 DIAGNOSIS — Z9081 Acquired absence of spleen: Secondary | ICD-10-CM | POA: Diagnosis not present

## 2019-04-05 DIAGNOSIS — R570 Cardiogenic shock: Secondary | ICD-10-CM | POA: Diagnosis not present

## 2019-04-05 DIAGNOSIS — Z515 Encounter for palliative care: Secondary | ICD-10-CM | POA: Diagnosis not present

## 2019-04-05 DIAGNOSIS — Z888 Allergy status to other drugs, medicaments and biological substances status: Secondary | ICD-10-CM

## 2019-04-05 DIAGNOSIS — Z79899 Other long term (current) drug therapy: Secondary | ICD-10-CM

## 2019-04-05 DIAGNOSIS — Z79891 Long term (current) use of opiate analgesic: Secondary | ICD-10-CM

## 2019-04-05 DIAGNOSIS — Z9049 Acquired absence of other specified parts of digestive tract: Secondary | ICD-10-CM

## 2019-04-05 DIAGNOSIS — F0390 Unspecified dementia without behavioral disturbance: Secondary | ICD-10-CM | POA: Diagnosis present

## 2019-04-05 DIAGNOSIS — J449 Chronic obstructive pulmonary disease, unspecified: Secondary | ICD-10-CM | POA: Diagnosis present

## 2019-04-05 DIAGNOSIS — R0600 Dyspnea, unspecified: Secondary | ICD-10-CM

## 2019-04-05 DIAGNOSIS — Z7952 Long term (current) use of systemic steroids: Secondary | ICD-10-CM

## 2019-04-05 DIAGNOSIS — J810 Acute pulmonary edema: Secondary | ICD-10-CM | POA: Diagnosis not present

## 2019-04-05 DIAGNOSIS — W19XXXD Unspecified fall, subsequent encounter: Secondary | ICD-10-CM | POA: Diagnosis present

## 2019-04-05 DIAGNOSIS — Z8543 Personal history of malignant neoplasm of ovary: Secondary | ICD-10-CM

## 2019-04-05 DIAGNOSIS — I471 Supraventricular tachycardia: Secondary | ICD-10-CM | POA: Diagnosis present

## 2019-04-05 DIAGNOSIS — R5381 Other malaise: Secondary | ICD-10-CM | POA: Diagnosis not present

## 2019-04-05 DIAGNOSIS — Z20822 Contact with and (suspected) exposure to covid-19: Secondary | ICD-10-CM | POA: Diagnosis present

## 2019-04-05 DIAGNOSIS — I480 Paroxysmal atrial fibrillation: Secondary | ICD-10-CM | POA: Diagnosis not present

## 2019-04-05 DIAGNOSIS — N179 Acute kidney failure, unspecified: Secondary | ICD-10-CM | POA: Diagnosis present

## 2019-04-05 DIAGNOSIS — D509 Iron deficiency anemia, unspecified: Secondary | ICD-10-CM | POA: Diagnosis present

## 2019-04-05 DIAGNOSIS — E876 Hypokalemia: Secondary | ICD-10-CM | POA: Diagnosis not present

## 2019-04-05 DIAGNOSIS — S72002D Fracture of unspecified part of neck of left femur, subsequent encounter for closed fracture with routine healing: Secondary | ICD-10-CM

## 2019-04-05 LAB — FOLATE RBC
Folate, Hemolysate: 476 ng/mL
Folate, RBC: 1497 ng/mL (ref >498)
Hematocrit: 31.8 % — ABNORMAL LOW (ref 34.0–46.6)

## 2019-04-05 LAB — GLUCOSE, CAPILLARY
Glucose-Capillary: 88 mg/dL (ref 70–99)
Glucose-Capillary: 131 mg/dL — ABNORMAL HIGH (ref 70–99)
Glucose-Capillary: 122 mg/dL — ABNORMAL HIGH (ref 70–99)
Glucose-Capillary: 136 mg/dL — ABNORMAL HIGH (ref 70–99)

## 2019-04-05 MED ORDER — FUROSEMIDE 10 MG/ML IJ SOLN
40.0000 mg | Freq: Once | INTRAMUSCULAR | Status: AC
  Administered 2019-04-05: 02:00:00 40 mg via INTRAVENOUS
  Filled 2019-04-05: qty 4

## 2019-04-05 MED ORDER — IPRATROPIUM-ALBUTEROL 0.5-2.5 (3) MG/3ML IN SOLN: 3.0000 mL | Freq: Four times a day (QID) | RESPIRATORY_TRACT | Status: DC

## 2019-04-05 NOTE — Progress Notes (Signed)
Received call at 10:30 pm Pt is A Fib with RVR rate 160s BP 68/50s at best Wasn't given Resp Rate or O2 sats, but those have also been an issue today and was requiring 4L O2 today. Due to very low BP, couldn't/didn't feel comfortable giving B Blocker or Calcium channel blocker or even gtt Amiodarone since could drop BP more.   Spoke to ICU- they are willing to take her-  Placed d/c and readmit orders to readmit to ICU Per ICu doc, had Rapid Response, Mindy give 1L of NS and take to ICU asap for Amiodarone.

## 2019-04-05 NOTE — Progress Notes (Signed)
Physical Therapy Session Note  Patient Details  Name: Virginia Thompson MRN: [id number] Date of Birth: 02/18/1929  Today's Date: 04/10/2019 PT Individual Time: 0925-0950 PT Individual Time Calculation (min): 25 min   Short Term Goals: Week 1:  PT Short Term Goal 1 (Week 1): Pt will perform bed mobility w/ mod assist PT Short Term Goal 2 (Week 1): Pt will perform sit<>stand w/ min assist PT Short Term Goal 3 (Week 1): Pt will ambulate 15' w/ LRAD w/ min assist PT Short Term Goal 4 (Week 1): Pt will tolerate 60 min of upright activity w/ minimal increase in fatigue  Skilled Therapeutic Interventions/Progress Updates:   Pt received supine in bed, alseep. Aroused with effort. NT assisted to remove purewick. RN assessing Vital signs BP:93/62, HR 127. PT applied Ted hose to BLE.  PT encouraged to attempt sitting EOB. Max assist to bring RLE off EOB. PT noted to have initiation of movement through trunk or LLE with encouragement to come to EOB. Pt noted to be falling asleep unless actively engaged in conversation, but speech shallow and diarthric. Vital signs reassessed BP 92/52. HR 103, Resp: 33. SpO2 90% on 4L/min O2. Pt left supine in bed with call bell in reach and all needs met.       Therapy Documentation Precautions:  Precautions Precautions: Fall Restrictions Weight Bearing Restrictions: No LLE Weight Bearing: Weight bearing as tolerated    Vital Signs: Therapy Vitals Pulse Rate: (!) 103 Resp: (!) 33 BP: (!) 92/52 Patient Position (if appropriate): Lying Oxygen Therapy O2 Device: Nasal Cannula FiO2 (%): 95 % Pain: Faces: hurts a little.    Therapy/Group: Individual Therapy   E  04/21/2019, 12:34 PM  

## 2019-04-05 NOTE — Progress Notes (Signed)
MD updated of patient's vital signs ; no new orders but to wait for labs in the morning. Continued to monitor.

## 2019-04-05 NOTE — Plan of Care (Signed)
  Problem: RH BOWEL ELIMINATION Goal: RH STG MANAGE BOWEL WITH ASSISTANCE Description: STG Manage Bowel with Assistance. Mod Outcome: Not Progressing; incontinence   Problem: RH BLADDER ELIMINATION Goal: RH STG MANAGE BLADDER WITH ASSISTANCE Description: STG Manage Bladder With Assistance Mod Outcome: Not Progressing; incontinence   Problem: RH KNOWLEDGE DEFICIT GENERAL Goal: RH STG INCREASE KNOWLEDGE OF SELF CARE AFTER HOSPITALIZATION Description: Patient/caregiver will be able to verbalize plan for home care including medication management, safety, skin protection and s/s to report to provider with cues/handout Outcome: Not Progressing; confusion

## 2019-04-05 NOTE — Significant Event (Addendum)
Rapid Response Event Note  Overview: Called d/t acute respiratory distress. RN doing rounds and noticed pt's work of breathing was increased. RN checked VS and SpO2-70s on 2L [place].  Time Called: 0135 Arrival Time: 0138 Event Type: Respiratory  Initial Focused Assessment: Pt laying in bed with eyes closed, mildly SOB. Pt alert to self and place and says she feels like her breathing is a little labored. Lungs with crackles t/o all lung fields. HR-90, BP-103/58, RR-26, SpO2-97% on 6L [place].   Interventions: PCXR-Persistent but improving bilateral pulmonary opacities. 40mg  lasix IV x 1  Plan of Care (if not transferred): Give lasix and monitor response. Pt is incontinent. May want to place purewick to more accurately monitor urine output. Attempt to wean 6LNC back down to 2L [place](pt is on this at home) as SpO2 allows. Alert Provider of [place] results. Call RRT if further assistance needed.  Event Summary: Name of Physician Notified: [place], MD at (PTA RRT)    at  Event End: 0215        Outcome: Pt stayed and stabilized.         Dillard Essex

## 2019-04-05 NOTE — Progress Notes (Signed)
Patient is more alert ; able to eat half of dinner and drink water with help.

## 2019-04-05 NOTE — Progress Notes (Addendum)
MD notified of patient's vital signs ; new orders noted

## 2019-04-05 NOTE — Progress Notes (Signed)
Physical Therapy Session Note  Patient Details  Name: Virginia Thompson MRN: [id number] Date of Birth: Aug 26, 1928  Today's Date: 04/11/2019 PT Individual Time: 1535-1600 PT Individual Time Calculation (min): 25 min   Short Term Goals: Week 1:  PT Short Term Goal 1 (Week 1): Pt will perform bed mobility w/ mod assist PT Short Term Goal 2 (Week 1): Pt will perform sit<>stand w/ min assist PT Short Term Goal 3 (Week 1): Pt will ambulate 15' w/ LRAD w/ min assist PT Short Term Goal 4 (Week 1): Pt will tolerate 60 min of upright activity w/ minimal increase in fatigue  Skilled Therapeutic Interventions/Progress Updates:   Pt received supine in bed and agreeable to PT at bed level. PT assessed vital signs. See below. Pt  Noted to have continued labored breathing, but decreased rate from earlier in day. Pt agreeable to attempt bed level therex. Hip/knee flexion/extension with AAROM and hip abduction/adduction. Attempted to perform x 10 BLE for each exercise, but pt falling asleep mid movement and distant gaze towards the ceiling, intermittent BLE tremors noted with extensor response to plantar surface of the foot simulated. Pt also noted to have increased dilation in the R pupil compared to the L. As pt unable to sustain attention to task for > 30 seconds, PT reassessed vital signs. BP 104/41. HR 102., SpO2 94% on 4L/min. Resp 26. Throughout session Pt's HR remained >101bpm. Pt left supine in bed with call bell in reach and all needs met.       Therapy Documentation Precautions:  Precautions Precautions: Fall Restrictions Weight Bearing Restrictions: No LLE Weight Bearing: Weight bearing as tolerated General: PT Amount of Missed Time (min): 70 Minutes PT Missed Treatment Reason: Patient fatigue Vital Signs: Therapy Vitals Temp: (!) 97.5 F (36.4 C) Pulse Rate: (!) 108 Resp: (!) 25 BP: (!) 103/42 Patient Position (if appropriate): Lying Oxygen Therapy SpO2: 94 % O2 Device: Nasal  Cannula O2 Flow Rate (L/min): 4 L/min Patient Activity (if Appropriate): In bed Pain: denies   Therapy/Group: Individual Therapy  Lorie Phenix 04/21/2019, 4:15 PM

## 2019-04-05 NOTE — Progress Notes (Signed)
Went to this pt's room for rounding and noticed her using accessory muscles for breathing. Crackles were noted on auscultation.  O2 was increased to 6L [place], that moment 02 sat was in the 70's. Rapid response staff was notified at 0135 and MD on call was notified. Orders were received and were carried out. Currently, pt is doing much better but on [facility]

## 2019-04-05 NOTE — Progress Notes (Signed)
[place] PHYSICAL MEDICINE & REHABILITATION PROGRESS NOTE  Subjective/Complaints: Patient seen sitting up in bed this AM.  She states she slept well overnight; she    Pt reports feels a little SOB- a rapid response was called last night-CXR showed slightly improved B/L pulm opacities- Lasix 40 mg IV x1 was given- has increased volume output.  Her BP is a little low 92/52- but pulse 103- Resp rate 33-  Have ordered Duonebs QID x 2 days along with prn breathing treatments.  Already on Lasix 60 mg daily- will monitor closely next few hours and if need be, give another dose of Lasix.     ROS: Denies CP, (+) shortness of breath, nausea, vomiting, diarrhea.  Objective: Vital Signs: Blood pressure (!) 92/52, pulse (!) 103, temperature 98.8 F (37.1 C), resp. rate (!) 33, weight 85.3 kg, SpO2 94 %. DG Chest Port 1 View  Result Date: 04/20/2019 CLINICAL DATA:  Respiratory distress EXAM: PORTABLE CHEST 1 VIEW COMPARISON:  04/02/2019 FINDINGS: Again noted are diffuse bilateral pulmonary opacities. These have slightly improved since the prior study. There is a right-sided PICC line that appears well position. The heart size remains enlarged. Aortic calcifications are again noted. There are likely small bilateral pleural effusions. IMPRESSION: 1. Persistent but improving bilateral pulmonary opacities. 2. Well-positioned right-sided PICC line. 3. Stable cardiac silhouette. Electronically Signed   By: Constance Holster M.D.   On: 04/27/2019 02:07   Recent Labs    04/04/19 0350  WBC 6.5  HGB 9.4*  HCT 33.4*  PLT 356   No results for input(s): NA, K, CL, CO2, GLUCOSE, BUN, CREATININE, CALCIUM in the last 72 hours.  Physical Exam: BP (!) 92/52   Pulse (!) 103   Temp 98.8 F (37.1 C)   Resp (!) 33   Wt 85.3 kg   SpO2 94%   BMI 32.28 kg/m  Constitutional: No distress- appears slightly SOB, using accessory muscles slightly- daughter in room; talking some; slumped over a little in bed- not  helping breathing- on O2 by [place]- 4L . Vital signs reviewed. Morbidly obese.  HENT: Normocephalic.  Atraumatic. Eyes: EOMI. No discharge. Cardiovascular: borderline tachycardia Respiratory: Accessory muscles for breathing. +. GI: Non-distended. Skin: Warm and dry.  Intact. Psych: Normal mood.  Normal behavior. Musc: No edema in extremities.  No tenderness in extremities. Neuro: Alert Motor: Bilateral upper extremities: 5/5 proximal distal Left lower extremity: Hip flexion, knee extension 4-/5, ankle dorsiflexion 5/5, stable Right lower extremity: Hip flexion, knee extension 4-/5, ankle dorsiflexion 5/5, stable  Assessment/Plan: 1. Functional deficits secondary to debility which require 3+ hours per day of interdisciplinary therapy in a comprehensive inpatient rehab setting.  Physiatrist is providing close team supervision and 24 hour management of active medical problems listed below.  Physiatrist and rehab team continue to assess barriers to discharge/monitor patient progress toward functional and medical goals  Care Tool:  Bathing    Body parts bathed by patient: Right arm, Left arm, Abdomen, Chest, Right upper leg, Left upper leg, Face   Body parts bathed by helper: Right lower leg, Left lower leg, Buttocks, Front perineal area     Bathing assist Assist Level: Moderate Assistance - Patient 50 - 74%     Upper Body Dressing/Undressing Upper body dressing   What is the patient wearing?: Pull over shirt    Upper body assist Assist Level: Supervision/Verbal cueing    Lower Body Dressing/Undressing Lower body dressing      What is the patient wearing?: Pants  Lower body assist Assist for lower body dressing: Maximal Assistance - Patient 25 - 49%     Toileting Toileting Toileting Activity did not occur (Clothing management and hygiene only): (incontient)  Toileting assist Assist for toileting: Total Assistance - Patient < 25%     Transfers Chair/bed  transfer  Transfers assist     Chair/bed transfer assist level: Minimal Assistance - Patient > 75%     Locomotion Ambulation   Ambulation assist      Assist level: Minimal Assistance - Patient > 75% Assistive device: Walker-rolling Max distance: 17ft   Walk 10 feet activity   Assist  Walk 10 feet activity did not occur: Safety/medical concerns        Walk 50 feet activity   Assist Walk 50 feet with 2 turns activity did not occur: Safety/medical concerns         Walk 150 feet activity   Assist Walk 150 feet activity did not occur: Safety/medical concerns         Walk 10 feet on uneven surface  activity   Assist Walk 10 feet on uneven surfaces activity did not occur: Safety/medical concerns         Wheelchair     Assist Will patient use wheelchair at discharge?: Yes   Wheelchair activity did not occur: Safety/medical concerns  Wheelchair assist level: Minimal Assistance - Patient > 75% Max wheelchair distance: 81ft    Wheelchair 50 feet with 2 turns activity    Assist    Wheelchair 50 feet with 2 turns activity did not occur: Safety/medical concerns       Wheelchair 150 feet activity     Assist Wheelchair 150 feet activity did not occur: Safety/medical concerns          Medical Problem List and Plan: 1. Debility secondary to HCAP  Continue CIR, made 15/7 2. Antithrombotics: -DVT/anticoagulation:Pharmaceutical:Lovenox -antiplatelet therapy: N/A 3.Neuropathy/chronic pain/Pain Management: Butrans patch daily. On cymbalta. Monitor for sedation  Controlled on 2/5  Monitor with increased mobility 4. Mood:LCSW to follow for evaluation and support. -antipsychotic agents: N/A 5. Neuropsych: This patientiscapable of making decisions onherown behalf. 6. Skin/Wound Care:Monitor incision daily for healing 7. Fluids/Electrolytes/Nutrition:Offer supplement during the day.Advised patient to  have family bring in food from home to help with intake.  BMP ordered for Monday 8. Aspiration [id number] day course of cefepime on 1/31.   9. PAF with RVR: Monitor HR --continue metoprolol and ASA.   ECG on 1/29 showing A. fib with RVR, repeat ECG personally reviewed, normal sinus  2/6- tachycardic- didn't sound in Afib  Monitor with increased activity 10. Left femur fracture s/p IM nailing: WBAT- lovenox through 04/07/2019 11. History of Chronic Hypotension:Continue midodrinetid.  Labile on 2/5  Monitor with increased mobility 12. 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 OSA: Needs to use CPAP at nights and whenever napping.  14.Obstructive lung disease/Chronic bronchitis:Continue prednisone, home dose of 5 mg of prednisone per day  Confounded by narcotics  Tapering steroids to home dose  Supplemental oxygen dependent at baseline  Respiratory status stable on Dul[place] bid.   Schedule nebulizers  CXR on 1/31 personally reviewed, worsening, repeat CXR personally reviewed-again worsening on 2/3.  Procalcitonin within normal limits.  Discussed with respiratory therapy BiPAP, encourage patient to bring in home device with special mask  ABG ordered with critical value carbon dioxide.    Discussed  with pulmonary-appreciate recs, recommending diuresis - signed off.  Palliative care consult ordered, pending  2/6- Rapid response last night- Lasix 40 mg IV x1 given- will recheck labs and see if too dry; ordered Scheduled Duonebs QID for 2 days.  15.Acute onChronic diastolic CHF: Monitor for signs and symptoms of fluid overload.Now on 1500 cc FR.  Continue low dose metoprolol   2/6- held Metoprolol due to low BP   Lasix 60 mg daily (was on 40 mg bid PTA?). Heart healthy diet.  Filed Weights   04/03/19 0603 04/04/19 0500 04/23/2019 0505  Weight: 89.4 kg 87.4 kg  85.3 kg   Improving on 2/6- down 2+ kg- will con't Lasix 60 mg daily 16. Left apical lung nodule: Follow up CT in 12 months recommended. 17.  Drug-induced constipation:   KUB personally reviewed, suggesting constipation  Bowel meds increased  Improving 18.  Hypoalbuminemia  Supplement initiated on 2/2 19. Urinary incontinence  PVRs relatively unremarkable  Flomax started on 2/4  ?Improving overall 20.  Dysphagia  D3 thins, advance as tolerated 21. Steroid induced hyperglycemia  Labile on 2/5  LOS: 5 days A FACE TO FACE EVALUATION WAS PERFORMED  Tashi Band 04/04/2019, 11:57 AM

## 2019-04-05 NOTE — Progress Notes (Signed)
Occupational Therapy Session Note  Patient Details  Name: Virginia Thompson MRN: [id number] Date of Birth: 1928/10/09  Today's Date: 04/21/2019 OT Individual Time: [id number] OT Individual Time Calculation (min): 17 min  45 missed minutes secondary to fatigue  Short Term Goals: Week 1:  OT Short Term Goal 1 (Week 1): Pt will complete bathing with min assist at sit > stand level OT Short Term Goal 2 (Week 1): Pt will complete LB dressing with mod assist (including socks/shoes) OT Short Term Goal 3 (Week 1): Pt will complete toilet transfer with Min assist OT Short Term Goal 4 (Week 1): Pt will complete 1 grooming task in standing to demonstrate increased standing tolerance  Skilled Therapeutic Interventions/Progress Updates:    Upon entering the room, pt supine in bed. OT checking vitals with BP being 98/53 in supine, HR 73, and O2 was 93% while on 4 L via [place]. Pt does not report any pain this session. Pt given cloth to wash face and falling asleep as she attempts to bring it towards eyes. Pt required increased time but did answer therapist and reports, " I'm just so tired." Pt did have rapid response called last night and O2 increased from 2 L to 4 L. Pt missing 45 minutes secondary to feeling fatigued and unwell. OT assisted with comfort and repositioning and pt remained in bed. BEd alarm activated and call bell within reach.   Therapy Documentation Precautions:  Precautions Precautions: Fall Restrictions Weight Bearing Restrictions: No LLE Weight Bearing: Weight bearing as tolerated General: General OT Amount of Missed Time: 45 Minutes Vital Signs: Therapy Vitals Temp: 99.1 F (37.3 C) Pulse Rate: 96 Resp: (!) 26 BP: (!) 91/57 Oxygen Therapy O2 Device: Nasal Cannula FiO2 (%): 95 % Pain:   ADL: ADL Grooming: Minimal assistance Where Assessed-Grooming: Sitting at sink Upper Body Bathing: Minimal assistance Where Assessed-Upper Body Bathing: Sitting at sink Lower Body Bathing:  Maximal assistance Where Assessed-Lower Body Bathing: Sitting at sink, Standing at sink Upper Body Dressing: Minimal assistance Where Assessed-Upper Body Dressing: Sitting at sink Lower Body Dressing: Maximal assistance Where Assessed-Lower Body Dressing: Sitting at sink, Standing at sink Toileting: Dependent Where Assessed-Toileting: Other (Comment)(EOB/incontinent) Toilet Transfer: Moderate assistance Toilet Transfer Method: Stand pivot Toilet Transfer Equipment: Bedside commode   Therapy/Group: Individual Therapy  Gypsy Decant 04/05/2019, 2:25 PM

## 2019-04-05 NOTE — Significant Event (Addendum)
Rapid Response Event Note  Overview: Called d/t HR-160s, SBP-50s Time Called: 2210 Arrival Time: 2215 Event Type: Cardiac, Hypotension  Initial Focused Assessment: Pt laying in bed, pleasantly confused, will answer simple questions, and follow commands. Pt denies chest pain/SOB. Pt breathing is mildly labored. Lungs with scattered crackles t/o, skin warm and dry. HR-160s(Afib), BP- 68/56, RR-18, SpO2-92% on 4L [place].  Interventions: EKG-Afib with RVR 1L NS bolus started slowly d/t crackles in lungs Tx to ICU [id number]) Plan of Care (if not transferred): Will transfer to ICU for closer monitoring. Will start amiodarone infusion on arrival to ICU. Event Summary: Name of Physician Notified: Lovorn MD at 2252    at    Outcome: Transferred (Comment)(2M15)     Dillard Essex

## 2019-04-06 ENCOUNTER — Inpatient Hospital Stay (HOSPITAL_COMMUNITY): Payer: Medicare Other

## 2019-04-06 DIAGNOSIS — G9341 Metabolic encephalopathy: Secondary | ICD-10-CM

## 2019-04-06 DIAGNOSIS — E876 Hypokalemia: Secondary | ICD-10-CM

## 2019-04-06 DIAGNOSIS — I480 Paroxysmal atrial fibrillation: Secondary | ICD-10-CM

## 2019-04-06 DIAGNOSIS — I4891 Unspecified atrial fibrillation: Secondary | ICD-10-CM | POA: Diagnosis present

## 2019-04-06 DIAGNOSIS — J810 Acute pulmonary edema: Secondary | ICD-10-CM

## 2019-04-06 DIAGNOSIS — R0600 Dyspnea, unspecified: Secondary | ICD-10-CM

## 2019-04-06 DIAGNOSIS — R570 Cardiogenic shock: Secondary | ICD-10-CM

## 2019-04-06 LAB — COMPREHENSIVE METABOLIC PANEL
ALT: 27 U/L (ref 0–44)
ALT: 23 U/L (ref 0–44)
AST: 27 U/L (ref 15–41)
AST: 33 U/L (ref 15–41)
Albumin: 2.5 g/dL — ABNORMAL LOW (ref 3.5–5.0)
Albumin: 2.1 g/dL — ABNORMAL LOW (ref 3.5–5.0)
Alkaline Phosphatase: 80 U/L (ref 38–126)
Alkaline Phosphatase: 70 U/L (ref 38–126)
Anion gap: 9 (ref 5–15)
Anion gap: 9 (ref 5–15)
BUN: 24 mg/dL — ABNORMAL HIGH (ref 8–23)
BUN: 27 mg/dL — ABNORMAL HIGH (ref 8–23)
CO2: 44 mmol/L — ABNORMAL HIGH (ref 22–32)
CO2: 40 mmol/L — ABNORMAL HIGH (ref 22–32)
Calcium: 8 mg/dL — ABNORMAL LOW (ref 8.9–10.3)
Calcium: 7.7 mg/dL — ABNORMAL LOW (ref 8.9–10.3)
Chloride: 87 mmol/L — ABNORMAL LOW (ref 98–111)
Chloride: 96 mmol/L — ABNORMAL LOW (ref 98–111)
Creatinine, Ser: 0.71 mg/dL (ref 0.44–1.00)
Creatinine, Ser: 1.01 mg/dL — ABNORMAL HIGH (ref 0.44–1.00)
GFR calc Af Amer: >60 mL/min (ref >60)
GFR calc Af Amer: 58 mL/min — ABNORMAL LOW (ref >60)
GFR calc non Af Amer: >60 mL/min (ref >60)
GFR calc non Af Amer: 50 mL/min — ABNORMAL LOW (ref >60)
Glucose, Bld: 119 mg/dL — ABNORMAL HIGH (ref 70–99)
Glucose, Bld: 233 mg/dL — ABNORMAL HIGH (ref 70–99)
Potassium: 3.2 mmol/L — ABNORMAL LOW (ref 3.5–5.1)
Potassium: 4.2 mmol/L (ref 3.5–5.1)
Sodium: 140 mmol/L (ref 135–145)
Sodium: 145 mmol/L (ref 135–145)
Total Bilirubin: 0.4 mg/dL (ref 0.3–1.2)
Total Bilirubin: 0.7 mg/dL (ref 0.3–1.2)
Total Protein: 5.3 g/dL — ABNORMAL LOW (ref 6.5–8.1)
Total Protein: 4.9 g/dL — ABNORMAL LOW (ref 6.5–8.1)

## 2019-04-06 LAB — CBC
HCT: 32.1 % — ABNORMAL LOW (ref 36.0–46.0)
HCT: 26.4 % — ABNORMAL LOW (ref 36.0–46.0)
Hemoglobin: 8.7 g/dL — ABNORMAL LOW (ref 12.0–15.0)
Hemoglobin: 7.1 g/dL — ABNORMAL LOW (ref 12.0–15.0)
MCH: 29.6 pg (ref 26.0–34.0)
MCH: 30.3 pg (ref 26.0–34.0)
MCHC: 27.1 g/dL — ABNORMAL LOW (ref 30.0–36.0)
MCHC: 26.9 g/dL — ABNORMAL LOW (ref 30.0–36.0)
MCV: 109.2 fL — ABNORMAL HIGH (ref 80.0–100.0)
MCV: 112.8 fL — ABNORMAL HIGH (ref 80.0–100.0)
Platelets: 346 10*3/uL (ref 150–400)
Platelets: 270 10*3/uL (ref 150–400)
RBC: 2.94 MIL/uL — ABNORMAL LOW (ref 3.87–5.11)
RBC: 2.34 MIL/uL — ABNORMAL LOW (ref 3.87–5.11)
RDW: 15.2 % (ref 11.5–15.5)
RDW: 15.1 % (ref 11.5–15.5)
WBC: 6.6 10*3/uL (ref 4.0–10.5)
WBC: 10.4 10*3/uL (ref 4.0–10.5)
nRBC: 3.5 % — ABNORMAL HIGH (ref 0.0–0.2)
nRBC: 2.6 % — ABNORMAL HIGH (ref 0.0–0.2)

## 2019-04-06 LAB — URINALYSIS, ROUTINE W REFLEX MICROSCOPIC
Bilirubin Urine: NEGATIVE
Glucose, UA: NEGATIVE mg/dL
Ketones, ur: NEGATIVE mg/dL
Leukocytes,Ua: NEGATIVE
Nitrite: NEGATIVE
Protein, ur: 100 mg/dL — AB
Specific Gravity, Urine: 1.017 (ref 1.005–1.030)
pH: 5 (ref 5.0–8.0)

## 2019-04-06 LAB — CORTISOL: Cortisol, Plasma: 18.3 ug/dL

## 2019-04-06 LAB — GLUCOSE, CAPILLARY
Glucose-Capillary: 105 mg/dL — ABNORMAL HIGH (ref 70–99)
Glucose-Capillary: 129 mg/dL — ABNORMAL HIGH (ref 70–99)
Glucose-Capillary: 131 mg/dL — ABNORMAL HIGH (ref 70–99)
Glucose-Capillary: 151 mg/dL — ABNORMAL HIGH (ref 70–99)
Glucose-Capillary: 241 mg/dL — ABNORMAL HIGH (ref 70–99)

## 2019-04-06 LAB — TSH: TSH: 0.346 u[IU]/mL — ABNORMAL LOW (ref 0.350–4.500)

## 2019-04-06 LAB — LACTIC ACID, PLASMA
Lactic Acid, Venous: 0.9 mmol/L (ref 0.5–1.9)
Lactic Acid, Venous: 4.1 mmol/L (ref 0.5–1.9)

## 2019-04-06 LAB — MAGNESIUM: Magnesium: 1.9 mg/dL (ref 1.7–2.4)

## 2019-04-06 LAB — TROPONIN I (HIGH SENSITIVITY): Troponin I (High Sensitivity): 594 ng/L (ref <18)

## 2019-04-06 LAB — BRAIN NATRIURETIC PEPTIDE: B Natriuretic Peptide: 1106.6 pg/mL — ABNORMAL HIGH (ref 0.0–100.0)

## 2019-04-06 MED ORDER — ENOXAPARIN SODIUM 40 MG/0.4ML ~~LOC~~ SOLN: 40.0000 mg | SUBCUTANEOUS | Status: DC

## 2019-04-06 MED ORDER — ASPIRIN EC 81 MG PO TBEC
81.0000 mg | DELAYED_RELEASE_TABLET | Freq: Every day | ORAL | Status: DC
  Administered 2019-04-06: 81 mg via ORAL
  Filled 2019-04-06: qty 1

## 2019-04-06 MED ORDER — POTASSIUM CHLORIDE 10 MEQ/50ML IV SOLN
10.0000 meq | INTRAVENOUS | Status: DC
  Administered 2019-04-06 (×3): 10 meq via INTRAVENOUS
  Filled 2019-04-06 (×4): qty 50

## 2019-04-06 MED ORDER — SULFAMETHOXAZOLE-TRIMETHOPRIM 800-160 MG PO TABS: 1.0000 | ORAL_TABLET | ORAL | Status: DC

## 2019-04-06 MED ORDER — DULOXETINE HCL 30 MG PO CPEP
60.0000 mg | ORAL_CAPSULE | Freq: Every day | ORAL | Status: DC
  Administered 2019-04-06: 60 mg via ORAL
  Filled 2019-04-06: qty 2

## 2019-04-06 MED ORDER — POTASSIUM CHLORIDE 20 MEQ/15ML (10%) PO SOLN: 40.0000 meq | Freq: Once | ORAL | Status: DC

## 2019-04-06 MED ORDER — MORPHINE 100MG IN NS 100ML (1MG/ML) PREMIX INFUSION
0.0000 mg/h | INTRAVENOUS | Status: DC
  Administered 2019-04-06: 5 mg/h via INTRAVENOUS
  Filled 2019-04-06: qty 100

## 2019-04-06 MED ORDER — METOPROLOL TARTRATE 12.5 MG HALF TABLET: 12.5000 mg | ORAL_TABLET | Freq: Two times a day (BID) | ORAL | Status: DC

## 2019-04-06 MED ORDER — MORPHINE BOLUS VIA INFUSION
5.0000 mg | INTRAVENOUS | Status: DC | PRN
  Filled 2019-04-06: qty 5

## 2019-04-06 MED ORDER — ACETAMINOPHEN 325 MG PO TABS: 650.0000 mg | ORAL_TABLET | Freq: Four times a day (QID) | ORAL | Status: DC | PRN

## 2019-04-06 MED ORDER — MORPHINE SULFATE (PF) 2 MG/ML IV SOLN: 2.0000 mg | INTRAVENOUS | Status: DC | PRN

## 2019-04-06 MED ORDER — DOCUSATE SODIUM 100 MG PO CAPS
100.0000 mg | ORAL_CAPSULE | Freq: Every day | ORAL | Status: DC
  Administered 2019-04-06: 100 mg via ORAL
  Filled 2019-04-06: qty 1

## 2019-04-06 MED ORDER — PHENYLEPHRINE HCL-NACL 10-0.9 MG/250ML-% IV SOLN
0.0000 ug/min | INTRAVENOUS | Status: DC
  Administered 2019-04-06: 20 ug/min via INTRAVENOUS
  Filled 2019-04-06 (×2): qty 250

## 2019-04-06 MED ORDER — PRAMIPEXOLE DIHYDROCHLORIDE 1 MG PO TABS
1.0000 mg | ORAL_TABLET | Freq: Every day | ORAL | Status: DC
  Filled 2019-04-06 (×2): qty 1

## 2019-04-06 MED ORDER — NOREPINEPHRINE 4 MG/250ML-% IV SOLN
0.0000 ug/min | INTRAVENOUS | Status: DC
  Administered 2019-04-06: 14 ug/min via INTRAVENOUS
  Administered 2019-04-06: 40 ug/min via INTRAVENOUS
  Filled 2019-04-06 (×2): qty 250

## 2019-04-06 MED ORDER — AMIODARONE LOAD VIA INFUSION
150.0000 mg | Freq: Once | INTRAVENOUS | Status: AC
  Administered 2019-04-06: 150 mg via INTRAVENOUS
  Filled 2019-04-06: qty 83.34

## 2019-04-06 MED ORDER — DIPHENHYDRAMINE HCL 50 MG/ML IJ SOLN: 25.0000 mg | INTRAMUSCULAR | Status: DC | PRN

## 2019-04-06 MED ORDER — LORATADINE 10 MG PO TABS
10.0000 mg | ORAL_TABLET | Freq: Every day | ORAL | Status: DC
  Administered 2019-04-06: 10 mg via ORAL
  Filled 2019-04-06: qty 1

## 2019-04-06 MED ORDER — PANTOPRAZOLE SODIUM 40 MG PO TBEC
40.0000 mg | DELAYED_RELEASE_TABLET | Freq: Every day | ORAL | Status: DC
  Administered 2019-04-06: 40 mg via ORAL
  Filled 2019-04-06: qty 1

## 2019-04-06 MED ORDER — AMIODARONE HCL IN DEXTROSE 360-4.14 MG/200ML-% IV SOLN
60.0000 mg/h | INTRAVENOUS | Status: DC
  Administered 2019-04-06 (×2): 60 mg/h via INTRAVENOUS
  Filled 2019-04-06: qty 200

## 2019-04-06 MED ORDER — AMIODARONE HCL IN DEXTROSE 360-4.14 MG/200ML-% IV SOLN
30.0000 mg/h | INTRAVENOUS | Status: DC
  Filled 2019-04-06: qty 200

## 2019-04-06 MED ORDER — HEPARIN (PORCINE) 25000 UT/250ML-% IV SOLN
1150.0000 [IU]/h | INTRAVENOUS | Status: DC
  Administered 2019-04-06: 1150 [IU]/h via INTRAVENOUS
  Filled 2019-04-06: qty 250

## 2019-04-06 MED ORDER — ORAL CARE MOUTH RINSE
15.0000 mL | Freq: Two times a day (BID) | OROMUCOSAL | Status: DC
  Administered 2019-04-06 (×2): 15 mL via OROMUCOSAL

## 2019-04-06 MED ORDER — SODIUM CHLORIDE 0.9 % IV BOLUS
250.0000 mL | Freq: Once | INTRAVENOUS | Status: AC
  Administered 2019-04-06: 250 mL via INTRAVENOUS

## 2019-04-06 MED ORDER — SENNOSIDES-DOCUSATE SODIUM 8.6-50 MG PO TABS
2.0000 | ORAL_TABLET | Freq: Two times a day (BID) | ORAL | Status: DC
Start: 2019-04-06 — End: 2019-04-07
  Administered 2019-04-06: 2 via ORAL
  Filled 2019-04-06: qty 2

## 2019-04-06 MED ORDER — VANCOMYCIN HCL 1750 MG/350ML IV SOLN
1750.0000 mg | INTRAVENOUS | Status: AC
  Administered 2019-04-06: 1750 mg via INTRAVENOUS
  Filled 2019-04-06: qty 350

## 2019-04-06 MED ORDER — GLYCOPYRROLATE 0.2 MG/ML IJ SOLN: 0.2000 mg | INTRAMUSCULAR | Status: DC | PRN

## 2019-04-06 MED ORDER — POLYVINYL ALCOHOL 1.4 % OP SOLN: 1.0000 [drp] | Freq: Four times a day (QID) | OPHTHALMIC | Status: DC | PRN

## 2019-04-06 MED ORDER — VANCOMYCIN HCL 750 MG/150ML IV SOLN
750.0000 mg | INTRAVENOUS | Status: DC
  Filled 2019-04-06: qty 150

## 2019-04-06 MED ORDER — FOLIC ACID 1 MG PO TABS
1.0000 mg | ORAL_TABLET | Freq: Three times a day (TID) | ORAL | Status: DC
  Administered 2019-04-06: 1 mg via ORAL
  Filled 2019-04-06 (×2): qty 1

## 2019-04-06 MED ORDER — PREDNISONE 5 MG PO TABS
5.0000 mg | ORAL_TABLET | Freq: Every day | ORAL | Status: DC
  Administered 2019-04-06: 5 mg via ORAL
  Filled 2019-04-06: qty 1

## 2019-04-06 MED ORDER — FUROSEMIDE 10 MG/ML IJ SOLN
80.0000 mg | Freq: Once | INTRAMUSCULAR | Status: AC
  Administered 2019-04-06: 10:00:00 80 mg via INTRAVENOUS
  Filled 2019-04-06: qty 8

## 2019-04-06 MED ORDER — UMECLIDINIUM BROMIDE 62.5 MCG/INH IN AEPB
1.0000 | INHALATION_SPRAY | Freq: Every day | RESPIRATORY_TRACT | Status: DC
  Administered 2019-04-06: 1 via RESPIRATORY_TRACT
  Filled 2019-04-06: qty 7

## 2019-04-06 MED ORDER — MIDODRINE HCL 5 MG PO TABS
10.0000 mg | ORAL_TABLET | Freq: Three times a day (TID) | ORAL | Status: DC
  Administered 2019-04-06 (×2): 10 mg via ORAL
  Filled 2019-04-06 (×2): qty 2

## 2019-04-06 MED ORDER — CHLORHEXIDINE GLUCONATE CLOTH 2 % EX PADS: 6.0000 | MEDICATED_PAD | Freq: Every day | CUTANEOUS | Status: DC

## 2019-04-06 MED ORDER — NOREPINEPHRINE 4 MG/250ML-% IV SOLN
2.0000 ug/min | INTRAVENOUS | Status: DC
  Administered 2019-04-06: 1 ug/min via INTRAVENOUS
  Filled 2019-04-06: qty 250

## 2019-04-06 MED ORDER — SODIUM CHLORIDE 0.9 % IV SOLN
2.0000 g | Freq: Two times a day (BID) | INTRAVENOUS | Status: DC
  Administered 2019-04-06 (×2): 2 g via INTRAVENOUS
  Filled 2019-04-06 (×2): qty 2

## 2019-04-06 MED ORDER — HEPARIN SODIUM (PORCINE) 5000 UNIT/ML IJ SOLN
5000.0000 [IU] | Freq: Three times a day (TID) | INTRAMUSCULAR | Status: DC
  Administered 2019-04-06: 5000 [IU] via SUBCUTANEOUS
  Filled 2019-04-06: qty 1

## 2019-04-06 MED ORDER — ALBUTEROL SULFATE (2.5 MG/3ML) 0.083% IN NEBU
2.5000 mg | INHALATION_SOLUTION | Freq: Four times a day (QID) | RESPIRATORY_TRACT | Status: DC | PRN
  Administered 2019-04-06: 2.5 mg via RESPIRATORY_TRACT
  Filled 2019-04-06: qty 3

## 2019-04-06 MED ORDER — NOREPINEPHRINE 4 MG/250ML-% IV SOLN: 2.0000 ug/min | INTRAVENOUS | Status: DC

## 2019-04-06 MED ORDER — SODIUM CHLORIDE 0.9 % IV SOLN: 250.0000 mL | INTRAVENOUS | Status: DC

## 2019-04-06 MED ORDER — CHLORHEXIDINE GLUCONATE 0.12 % MT SOLN
15.0000 mL | Freq: Two times a day (BID) | OROMUCOSAL | Status: DC
  Administered 2019-04-06: 15 mL via OROMUCOSAL

## 2019-04-06 MED ORDER — ACETAMINOPHEN 500 MG PO TABS: 500.0000 mg | ORAL_TABLET | Freq: Four times a day (QID) | ORAL | Status: DC | PRN

## 2019-04-06 MED ORDER — SODIUM CHLORIDE 0.9 % IV SOLN
250.0000 mL | INTRAVENOUS | Status: DC
  Administered 2019-04-06: 03:00:00 250 mL via INTRAVENOUS

## 2019-04-06 MED ORDER — POTASSIUM CHLORIDE 10 MEQ/50ML IV SOLN
10.0000 meq | Freq: Once | INTRAVENOUS | Status: AC
  Administered 2019-04-06: 10:00:00 10 meq via INTRAVENOUS
  Filled 2019-04-06: qty 50

## 2019-04-06 MED ORDER — GLYCOPYRROLATE 1 MG PO TABS: 1.0000 mg | ORAL_TABLET | ORAL | Status: DC | PRN

## 2019-04-06 MED ORDER — ACETAMINOPHEN 650 MG RE SUPP: 650.0000 mg | Freq: Four times a day (QID) | RECTAL | Status: DC | PRN

## 2019-04-06 MED ORDER — TIOTROPIUM BROMIDE MONOHYDRATE 18 MCG IN CAPS: 18.0000 ug | ORAL_CAPSULE | Freq: Every day | RESPIRATORY_TRACT | Status: DC

## 2019-04-06 MED ORDER — MAGNESIUM SULFATE 2 GM/50ML IV SOLN
2.0000 g | Freq: Once | INTRAVENOUS | Status: AC
  Administered 2019-04-06: 2 g via INTRAVENOUS
  Filled 2019-04-06: qty 50

## 2019-04-06 MED ORDER — AMIODARONE IV BOLUS ONLY 150 MG/100ML: 150.0000 mg | Freq: Once | INTRAVENOUS | Status: DC

## 2019-04-06 MED ORDER — HEPARIN BOLUS VIA INFUSION
4000.0000 [IU] | Freq: Once | INTRAVENOUS | Status: AC
  Administered 2019-04-06: 4000 [IU] via INTRAVENOUS
  Filled 2019-04-06: qty 4000

## 2019-04-07 ENCOUNTER — Inpatient Hospital Stay (HOSPITAL_COMMUNITY): Payer: Medicare Other | Admitting: Occupational Therapy

## 2019-04-07 ENCOUNTER — Inpatient Hospital Stay (HOSPITAL_COMMUNITY): Payer: Medicare Other

## 2019-04-07 NOTE — Progress Notes (Signed)
Social Work Discharge Note   The overall goal for the admission was met for:   Discharge location: Acute Hospital  Length of Stay: 5 days  Discharge activity level: transfer to acute hospital  Home/community participation: N/A  Services provided included: MD, PT, OT, SLP, RN, CM, TR, Pharmacy, Neuropsych and SW  Financial Services: Other: [employer] Medicare  Follow-up services arranged: N/A  Comments (or additional information):  Patient/Family verbalized understanding of follow-up arrangements: N/A  Individual responsible for coordination of the follow-up plan:N/A  Pt transferred to acute hospital on 05-04-19 due to medical reasons.   Confirmed correct DME delivered: Rana Snare 04/07/2019    [name], MSW, Social Worker Office (preferred): [phone] Cell: [phone] Fax: [phone]

## 2019-04-07 NOTE — Discharge Summary (Signed)
Physician Discharge Summary  Patient ID: Virginia Thompson MRN: [id number] DOB/AGE: [birth year] 86 y.o.  Admit date: 03/31/2019 Discharge date: 2019-04-15  Discharge Diagnoses:  Principal Problem:   Atrial fibrillation with RVR ([place]) Active Problems:   DNR (do not resuscitate) discussion   Hypotension    Physical debility   Acute on chronic diastolic (congestive) heart failure (HCC)   Chronic pain syndrome   Hypoalbuminemia due to protein-calorie malnutrition (HCC)   OSA (obstructive sleep apnea)   Slow transit constipation   Supplemental oxygen dependent   Urinary incontinence   PAF (paroxysmal atrial fibrillation) ([place])   Drug induced constipation   Dysphagia   Steroid-induced hyperglycemia   Discharged Condition: critical.   Significant Diagnostic Studies: DG Chest 2 View  Result Date: 04/02/2019 CLINICAL DATA:  Fecal incontinence and shortness of breath. EXAM: CHEST - 2 VIEW COMPARISON:  03/30/2019 FINDINGS: Right-sided PICC line is noted with tip projecting over the cavoatrial junction. Mild cardiac enlargement and aortic atherosclerosis. Bilateral upper and lower lung zone interstitial and airspace opacities are again noted. The aeration to lungs is not significantly changed in the interval. IMPRESSION: Persistent bilateral upper and lower lung zone airspace opacities compatible with multifocal infection. Electronically Signed   By: Kerby Moors M.D.   On: 04/02/2019 10:11   DG Abd 1 View  Result Date: 04/02/2019 CLINICAL DATA:  Fecal incontinence and shortness of breath. EXAM: ABDOMEN - 1 VIEW COMPARISON:  None. FINDINGS: No abnormal bowel distension identified. There is a moderate stool burden identified throughout the colon up to the rectum. No free air noted. Bilateral pleural effusions and lower lobe airspace opacities noted within the visualized portions of the chest. Previous ORIF of the left femur. IMPRESSION: 1. Nonobstructive bowel gas pattern. 2. Moderate stool burden  within the colon. Electronically Signed   By: Kerby Moors M.D.   On: 04/02/2019 10:07   DG Chest Port 1 View  Result Date: 04/15/2019 CLINICAL DATA:  Acute respiratory distress. EXAM: PORTABLE CHEST 1 VIEW COMPARISON:  04/27/2019 FINDINGS: Again noted are diffuse bilateral hazy airspace opacities. These are relatively stable from prior study. The right-sided PICC line is stable in position. The heart size is stable. Aortic calcifications are noted. There is no significant pneumothorax. There are likely small bilateral pleural effusions. There is no acute osseous abnormality. IMPRESSION: No significant interval change. Persistent bilateral diffuse pulmonary opacities are again noted. Electronically Signed   By: Constance Holster M.D.   On: 2019/04/15 00:47   DG Chest Port 1 View  Result Date: 04/18/2019 CLINICAL DATA:  Respiratory distress EXAM: PORTABLE CHEST 1 VIEW COMPARISON:  04/02/2019 FINDINGS: Again noted are diffuse bilateral pulmonary opacities. These have slightly improved since the prior study. There is a right-sided PICC line that appears well position. The heart size remains enlarged. Aortic calcifications are again noted. There are likely small bilateral pleural effusions. IMPRESSION: 1. Persistent but improving bilateral pulmonary opacities. 2. Well-positioned right-sided PICC line. 3. Stable cardiac silhouette. Electronically Signed   By: Constance Holster M.D.   On: 04/10/2019 02:07    DG Swallowing Func-Speech Pathology  Result Date: 04/03/2019 Objective Swallowing Evaluation: Type of Study: MBS-Modified Barium Swallow Study  Patient Details Name: Virginia Thompson MRN: [id number] Date of Birth: May 14, 1928 Today's Date: 04/03/2019 Time: SLP Start Time (ACUTE ONLY): [id number] -SLP Stop Time (ACUTE ONLY): 1104 SLP Time Calculation (min) (ACUTE ONLY): 32 min Past Medical History: Past Medical History: Diagnosis Date . Blood transfusion without reported diagnosis  . Cancer ([place])  .  CHF (congestive  heart failure) ([place])  . Hypertension  . Hypertension  . Lymphoma ([place])  . Sleep apnea  Past Surgical History: Past Surgical History: Procedure Laterality Date . ABDOMINAL HYSTERECTOMY   . APPENDECTOMY   . BACK SURGERY   . BREAST CYST ASPIRATION Bilateral  . CHOLECYSTECTOMY   . EYE SURGERY   . INTRAMEDULLARY (IM) NAIL INTERTROCHANTERIC Left 03/07/2019  Procedure: INTRAMEDULLARY (IM) NAIL INTERTROCHANTRIC;  Surgeon: Altamese Clairton, MD;  Location: [facility];  Service: Orthopedics;  Laterality: Left; . JOINT REPLACEMENT Left  . SPLENECTOMY   . 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  Patient was febrile and had worsening respiratory failure so intubated in the ED. Covid-19 negative  Subjective: Pt resting in bed. No family present. RN present intermittently Assessment / Plan / Recommendation CHL IP CLINICAL IMPRESSIONS 04/03/2019 Clinical Impression Pt presents with overall moderate level of fatigue and was on 3liters O2 via nasal cannula. Pt presents with mild oral phase dysphagia c/b decreased lingual manipulation and bolus cohesion of thin liquids and dysphagia 3. Pt's extended oral phase when consuming dysphagia 3 is likely combination of the appearance dry oral cavity. This patient is known to Probation officer and at bedside she consumes dyspahgia 3 without any oral phase difficulties.  Pt has developed compensatory strategy of achieving/establishing airway closure prior to swallow initiation across all trials. She maintains airway closure during swallow. With use of this strategy she may experience increased work of breathing during PO consumption. Given pt's ability at bedside and fact that she has been consuming dysphagai 3 diet for ~ 1 week, recommend she continue dysphagia 3, thin liquids and medicine whole in puree. SLP  Visit Diagnosis Dysphagia, unspecified (R13.10) Attention and concentration deficit following -- Frontal lobe and executive function deficit following -- Impact on safety and function Mild aspiration risk   CHL IP TREATMENT RECOMMENDATION 04/03/2019 Treatment Recommendations Therapy as outlined in treatment plan below   Prognosis 03/25/2019 Prognosis for Safe Diet Advancement Good Barriers to Reach Goals -- Barriers/Prognosis Comment -- CHL IP DIET RECOMMENDATION 04/03/2019 SLP Diet Recommendations Dysphagia 3 (Mech soft) solids;Thin liquid Liquid Administration via -- Medication Administration -- Compensations (No Data) Postural Changes --   CHL IP OTHER RECOMMENDATIONS 04/03/2019 Recommended Consults -- Oral Care Recommendations Oral care BID Other Recommendations --   CHL IP FOLLOW UP RECOMMENDATIONS 03/28/2019 Follow up Recommendations None   CHL IP FREQUENCY AND DURATION 03/25/2019 Speech Therapy Frequency (ACUTE ONLY) min 2x/week Treatment Duration 2 weeks;1 week      CHL IP ORAL PHASE 04/03/2019 Oral Phase Impaired Oral - Pudding Teaspoon -- Oral - Pudding Cup -- Oral - Honey Teaspoon -- Oral - Honey Cup -- Oral - Nectar Teaspoon -- Oral - Nectar Cup WFL Oral - Nectar Straw -- Oral - Thin Teaspoon -- Oral - Thin Cup Weak lingual manipulation;Decreased bolus cohesion Oral - Thin Straw Weak lingual manipulation;Decreased bolus cohesion Oral - Puree WFL Oral - Mech Soft Weak lingual manipulation;Decreased bolus cohesion;Reduced posterior propulsion;Impaired mastication Oral - Regular -- Oral - Multi-Consistency -- Oral - Pill Other (Comment);WFL Oral Phase - Comment --  CHL IP PHARYNGEAL PHASE 04/03/2019 Pharyngeal Phase WFL Pharyngeal- Pudding Teaspoon -- Pharyngeal -- Pharyngeal- Pudding Cup -- Pharyngeal -- Pharyngeal- Honey Teaspoon -- Pharyngeal -- Pharyngeal- Honey Cup -- Pharyngeal -- Pharyngeal- Nectar Teaspoon -- Pharyngeal --  Pharyngeal- Nectar Cup -- Pharyngeal -- Pharyngeal- Nectar Straw -- Pharyngeal --  Pharyngeal- Thin Teaspoon -- Pharyngeal -- Pharyngeal- Thin Cup -- Pharyngeal -- Pharyngeal- Thin Straw -- Pharyngeal -- Pharyngeal- Puree -- Pharyngeal -- Pharyngeal- Mechanical Soft -- Pharyngeal -- Pharyngeal- Regular -- Pharyngeal -- Pharyngeal- Multi-consistency -- Pharyngeal -- Pharyngeal- Pill -- Pharyngeal -- Pharyngeal Comment --  CHL IP CERVICAL ESOPHAGEAL PHASE 04/03/2019 Cervical Esophageal Phase WFL Pudding Teaspoon -- Pudding Cup -- Honey Teaspoon -- Honey Cup -- Nectar Teaspoon -- Nectar Cup -- Nectar Straw -- Thin Teaspoon -- Thin Cup -- Thin Straw -- Puree -- Mechanical Soft -- Regular -- Multi-consistency -- Pill -- Cervical Esophageal Comment -- Happi Overton 04/03/2019, 12:57 PM                 DG FEMUR PORT MIN 2 VIEWS LEFT  Result Date: 04/03/2019 CLINICAL DATA:  Left femoral fracture status post ORIF EXAM: LEFT FEMUR PORTABLE 2 VIEWS COMPARISON:  03/07/2019 FINDINGS: Frontal and lateral views of the left femur are obtained. The intramedullary rod and multiple proximal screws traversing the comminuted proximal left femoral fracture again noted, without significant change in positioning. No significant callus formation. No evidence of metallic hardware failure or loosening. Distal interlocking screws are unchanged in position. Lateral compartment left knee arthroplasty again noted and stable. There are diffuse vascular calcifications. Remaining soft tissues are unremarkable. IMPRESSION: 1. Stable postsurgical changes within the left femur. No evidence of metallic hardware failure or loosening. No significant callus formation. Electronically Signed   By: Randa Ngo M.D.   On: 04/03/2019 10:25    Labs:  Basic Metabolic Panel: Lab [id number] 04/01/19 0500  NA  --  140  K  --  3.6  CL  --  89*  CO2  --  42*  GLUCOSE  --  90  BUN  --  11  CREATININE  --  0.62  CALCIUM  --  8.4*  MG 2.2  --     CBC: Lab 04/01/19 0500 04/01/19 0500 04/04/19 0350  WBC 7.0   < > 6.5   NEUTROABS 4.5  --  4.2  HGB 8.9*   < > 9.4*  HCT 31.3*   < > 33.4*  31.8*  MCV 107.2*   < > 107.7*  PLT 330   < > 356    Brief HPI:   Virginia Thompson is a 86 y.o. female with history of diastolic CHF--2 L oxygen dependent, OSA-CPAP, chronic bronchitis, hypertension, lymphoma, autoimmune hemolytic anemia with recent admission /08-1/14/21 for left proximal femur fracture requiring IM nailing by Dr. Marcelino Scot.  She was discharged to SNF but readmitted on 03/24/2019 with severe pain BLE, hypotension, acute renal injury and shortness of breath.  She was treated with sepsis protocol and placed on BiPAP due to fevers.  She continued to decline in the ED due to acute on chronic hypoxic hypercapnic failure requiring intubation and was treated with stress dose steroids as well as IV antibiotics for aspiration pneumonia.    She was extubated to 3 L oxygen per  on 01/26 and respiratory failure felt to be multifactorial due to opiates in setting of multiple medical issues complicated by aspiration pneumonia.  Hospital course significant for A. fib with RVR requiring IV digoxin as well as low-dose beta-blocker for rate control.  Family was hesitant about starting anticoagulation therefore recommendations to follow-up with primary cardiology after discharge.  She did have worsening of respiratory status on 1/31 am requiring IV diuresis and was  restarted on burst of steroids due to concern of COPD exacerbation.  Therapy was ongoing and patient was noted to have significant debility with anxiety and fear affecting ADLs and mobility.  CIR was recommended due to functional decline   Hospital Course: Virginia Thompson was admitted to rehab 03/31/2019 for inpatient therapies to consist of PT, ST and OT at least three hours five days a week. Past admission physiatrist, therapy team and rehab RN have worked together to provide customized collaborative inpatient rehab.  At admission she required mod to max assist with basic  self-care task and max assist with mobility.  She was maintained on home dose Butrans for acute on chronic pain.   Her left hip incisions were noted to be healing well without any signs or symptoms of infection and sutures were removed on 2/2 without difficulty. Follow up X rays of hip showed stable hardware. She was transitioned to Bactrim M/W/F PJP prophylaxis due to history of hemolytic anemia.  She was maintained on slow steroid taper and blood sugars showed mild steroid-induced hyperglycemia. Follow-up CBC showed H&H to be stable.  Heart rate was monitored on 3 times daily basis and repeat EKG 02/04 showed that A. fib had resolved.  Weights were monitored daily and were noted on downward trend.  Blood pressures were monitored on TID basis and were reasonably controlled.  She continued to have increased WOB with activity as well as hypoxia. ABG ordered and showed evidence of hypercarbia  Oxycodone was discontinued and tylenol was used prn in addition to local measures for pain control.  Bowel program augmented to help manage OIC. Diet was downgraded due to concerns of aspiration and to help with tolerance. Follow-up chest x-ray 02/03 showed persistent effusions in BUL/BLL concerning for ongoing infection therefore PCCM was consulted for input/recommendations.  Dr. Tamala Julian felt that symptoms were due to fluid overload and frail state rather than infection. She was treated with a dose of IV Lasix and p.o. Diamox for fluid overload with hypercarbia.  And Palliative care was also consulted to discuss goals of care with patient.  her daughter reported steady decline since the fall with difficulty providing care for her. She was making slow progress with rehab and was felt to be a hospice candidate. On 02/06, she developed hypotension and was noted to have A fib with RVR.  She was treated with fluid bolus and transferred to ICU for rate control and further management.    Medications at discharge: 1.  ASA 81 mg  p.o. per day 2.  Cymbalta 60 mg p.o. daily 3.  Lovenox 40 mg subcu daily 4.  Prostat supplement twice daily 5.  Furosemide 60 mg p.o. daily 6.  Xopenex 0.63 mg 4 times daily 7.  Metoprolol 12.5 mg p.o. per day 8.  Midodrine 10 mg p.o. 3 times daily 9.  Protonix 40 mg p.o. daily 10.  Mirapex 1 mg p.o. nightly 11.  Senna S2 p.o. twice daily 12.  Flomax 0.4 mg p.o. nightly 13.  Septra DS 1 p.o. MWF 14.  Incruse Ellipta 1 puff daily 15.  Prednisone 5 mg p.o. per day  Disposition: Acute hospital  Discharge Instructions    Diet - low sodium heart healthy   Complete by: As directed       Signed: Bary Leriche 04/07/2019, 8:34 AM

## 2019-04-11 LAB — CULTURE, BLOOD (ROUTINE X 2)
Culture: NO GROWTH
Culture: NO GROWTH

## 2019-04-28 NOTE — Progress Notes (Signed)
Morphine 42mls wasted in stericycle, witnessed by Lucile Crater RN

## 2019-04-28 NOTE — Progress Notes (Signed)
[id number] with RVR), BP- 68/56-72/58, RR-18-24, SpO2-92-94% on 4L [place]. Pt laying in bed, pleasantly confused, A+Ox2, will answer simple questions and follow commands. Pt denies chest pain/SOB. Pt breathing is mildly labored. Lungs with scattered crackles t/o, mild audible wheezing. Skin is warm and dry. RR called. Mindy arrived on floor 21:45. Dr. Debe Coder contacted and informed of Pt status.  Interventions: EKG-Afib with RVR; 1L NS bolus started slowly d/t crackles in lungs. Pt was transferred to ICU ([id number]). Plan of Care: Will transfer to ICU for closer monitoring. Event Summary: Name of Physician Notified: Lovorn MD at 2252. POA: Daughter Trinda Pascal contacted post-transfer at 12:00am. Informed of status and transfer. Given ICU 28M phone # and Pt room #.

## 2019-04-28 NOTE — Progress Notes (Signed)
Pharmacy Antibiotic Note  Virginia Thompson is a 86 y.o. female admitted on 04/21/2019 with sepsis.  Pharmacy has been consulted for Vancomycin and Cefepime dosing.  Plan: Cefepime 2gm IV q12h Vancomycin 1750 mg IV now then 750 IV q24 hrs. Goal AUC 400-550. Expected AUC: 450 SCr used: 0.8, Vd coeff 0.5 Will f/u renal function, micro data, and pt's clinical condition Vanc levels prn     Temp (24hrs), Avg:98.4 F (36.9 C), Min:97.5 F (36.4 C), Max:99.3 F (37.4 C)  Recent Labs  Lab 03/30/19 0444 03/31/19 0500 04/01/19 0500 04/04/19 0350 04/27/2019 0050  WBC  --  6.3 7.0 6.5 6.6  CREATININE 0.56 0.70 0.62  --  0.71    Estimated Creatinine Clearance: 51.3 mL/min (by C-G formula based on SCr of 0.71 mg/dL).    Allergies  Allergen Reactions  . Aleve [Naproxen] Rash  . Mobic [Meloxicam] Rash  . Tape Rash    Antimicrobials this admission: 2/7 Vanc >>  2/7 Cefepime >>  1/25 Cefepime>>1/31  Microbiology results: 2/7 BCx:   Thank you for allowing pharmacy to be a part of this patient's care.  Sherlon Handing, PharmD, BCPS Please see amion for complete clinical pharmacist phone list 04/27/2019 2:20 AM

## 2019-04-28 NOTE — H&P (Addendum)
NAME:  Virginia Thompson, MRN:  [id number], DOB:  20-Jul-1928, LOS: 1 ADMISSION DATE:  04/16/2019, CONSULTATION DATE:  Apr 14, 2019 REFERRING MD:  Dagoberto Ligas  , CHIEF COMPLAINT:  Atrial fibrillation  Brief History   86 y.o. 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  Patient was intubated and PCCM admitted. Pt was treated for HF and COPD exacerbation and extubated and then discharged to Rehab.  Overnight, pt developed new onset Afib with RVR and hypotension, so was transferred to the ICU.    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  Per EMS report CPAP machine was not working and oxygen saturations were 69%, patient arrived on 15 L nonrebreather.  Patient was initially febrile, hypotensive and mildly tachycardic and sepsis protocol initiated and patient was given approximately 750 cc IV fluid lactic acid within normal limits, covered with cefepime and Flagyl.  Work-up revealed pulmonary edema versus atypical infiltrates on chest x-ray, negative Covid-19 and influenza, bland UA.   ABG showed respiratory acidosis with pH of 7.2 and PCO2 of 84, BiPAP initiated for approximately 45 minutes however patient's mental status was worsening and she was intubated in the ED.  Blood pressure has been very labile, patient started on Levophed then held on blood pressure improved.  BNP very elevated and she was given Lasix 40 mg x 1. CT PE protocol and CT head ordered and PCCM consulted for admission on 1/25.  She was treated for  COPD and CHF exacerbation and discharged to Rehab on 03/31/19.   Overnight on 2/7, pt developed new onset atrial fibrillation with RVR with rate 160's and hypotension, therefore was transferred to the ICU.  En route was given 250cc IVF bolus and converted back to NSR but remained tachypneic with borderline BP.  Was dyspneic overnight last night and was given extra Lasix dose.  Past Medical History   has a past medical history of Blood transfusion without reported diagnosis, Cancer ([place]), CHF (congestive heart failure) ([place]), Hypertension, Hypertension, Lymphoma ([place]), and Sleep apnea.   Significant Hospital Events   1/25 Admit to PCCM 2/1 Discharge to rehab 2/7 Re-admit to PCCM for atrial fibrillation   Consults:    Procedures:  1/25-1/26 ETT  Significant Diagnostic Tests:  1/25 CXR>>Cardiomegaly with small left pleural effusion and mild diffuse interstitial opacity, possibly indicating congestive heart failure. 1/25 CT head>>no acute findings 1/25 CTA chest> 2/7 CXR>> pending  Micro Data:  1/25 SARS-code-2 and influenza>> negative 1/25 BCx2>> 1/2 coag neg staph 1/25 UC>>no growth  Antimicrobials:   Cefepime 1/24- Flagyl 1/24 Vancomycin 1/24-  Interim history/subjective:  Pt arrived to the ICU on non-rebreather, awake and oriented to person and place  Objective   Temperature 98 F (36.7 C), temperature source Oral.    FiO2 (%):  [95 %] 95 %  No intake or output data in the 24 hours ending 04/14/2019 0011 There were no vitals filed for this visit.  General: Elderly female, awake and in no distress HEENT: MM pink/moist, pupils responsive,  Neuro: awake and baseline confused, however following  commands and orient  CV: s1s2 rrr, no m/r/g PULM: crackles bilaterally GI: soft, bsx4 active  Extremities: warm/dry, no edema  Skin: no rashes or lesions   Resolved Hospital Problem list     Assessment & Plan:   New onset Atrial Fibrillation with RVR and hypotension,  HFpEF, HTN, HL -spontaneously converted with IVF bolus -recent Echo with EF 60-65% and grade 1 diastolic dysfunction -remains dyspneic P: -BP remains borderline, start peripheral Levophed if needed and give additional 250cc bolus, received 100mg  Lasix yesterday and may have slightly over-diuresed, no fever or signs of new infection -Check metabolic panel, CBC, mag level, TSH and BNP  -CXR unchanged from yesterday, pt uses CPAP qhs at home, trial Bipap tonight -hold further Lasix and metoprolol for now given soft BP -If converts back to Afib, likely start amiodarone and consider cards consult/anticoagulation    History of autoimmune hemolytic anemia and iron deficiency anemia -Hemoglobin stable, on prednisone 5 mg daily P: -continue Prednision 5mg , received stress dose steroid during last admission -check cortisol level      Best practice:  Diet: regular pain/Anxiety/Delirium protocol (if indicated): n/a VAP protocol (if indicated): n/a DVT prophylaxis: Heparin GI prophylaxis: Protonix Glucose control: n/a Mobility: Bedrest Code Status: Full code Family Communication: updated pt's daughter Jacqlyn Larsen  Disposition: ICU  Labs   CBC: Recent Labs  Lab 03/31/19 0500 04/01/19 0500 04/04/19 0350  WBC 6.3 7.0 6.5  NEUTROABS  --  4.5 4.2  HGB 8.5* 8.9* 9.4*  HCT 29.8* 31.3* 33.4*  31.8*  MCV 107.2* 107.2* 107.7*  PLT 313 330 [id number]    Basic Metabolic Panel: Recent Labs  Lab 03/30/19 0444 03/31/19 0500 03/31/19 1832 04/01/19 0500  NA 139 139  --  140  K 3.7 3.9  --  3.6  CL 93* 92*  --  89*  CO2 37* 40*  --  42*  GLUCOSE 105* 120*  --  90  BUN 9 13  --  11  CREATININE 0.56 0.70  --  0.62  CALCIUM 8.3* 8.5*  --  8.4*  MG 2.3  --  2.2  --    GFR: Estimated Creatinine Clearance: 51.3 mL/min (by C-G formula based on SCr of 0.62 mg/dL). Recent Labs  Lab 03/31/19 0500 04/01/19 0500 04/02/19 1722 04/03/19 0511 04/04/19 0350  PROCALCITON  --   --  <0.10 <0.10 <0.10   WBC 6.3 7.0  --   --  6.5    Liver Function Tests: Recent Labs  Lab 04/01/19 0500  AST 28  ALT 35  ALKPHOS 92  BILITOT 0.4  PROT 5.7*  ALBUMIN 2.7*   No results for input(s): LIPASE, AMYLASE in the last 168 hours. No results for input(s): AMMONIA in the last 168 hours.  ABG    Component Value Date/Time   PHART 7.347 (L) 04/02/2019 1207   PCO2ART 86.9 (HH) 04/02/2019 1207   PO2ART 64.3 (L) 04/02/2019 1207   HCO3 46.4 (H) 04/02/2019 1207   TCO2 43 (H) 03/24/2019 0541   O2SAT 91.1 04/02/2019 1207     Coagulation Profile: No results for input(s): INR, PROTIME in the last 168 hours.  Cardiac Enzymes: No results for input(s): CKTOTAL, CKMB, CKMBINDEX, TROPONINI in the last 168 hours.  HbA1C: No results found for: HGBA1C  CBG: Recent Labs  Lab 04/17/2019 0558 04/20/2019 1135 04/15/2019 1636 04/22/2019 2103 04/16/2019 2357  GLUCAP 88 131* 122* 136* 105*    Review of Systems:   Unable to obtain 2/2 intubated  Past Medical History  She,  has a past medical history of Blood transfusion without reported diagnosis, Cancer ([place]), CHF (congestive heart failure) ([place]), Hypertension, Hypertension, Lymphoma ([place]), and Sleep apnea.   Surgical History    Past Surgical History:  Procedure Laterality Date   ABDOMINAL HYSTERECTOMY     APPENDECTOMY     BACK SURGERY     BREAST CYST ASPIRATION Bilateral    CHOLECYSTECTOMY     EYE SURGERY     INTRAMEDULLARY (IM) NAIL INTERTROCHANTERIC Left 03/07/2019   Procedure: INTRAMEDULLARY (IM) NAIL INTERTROCHANTRIC;  Surgeon: Altamese Terlingua, MD;  Location: [facility];  Service: Orthopedics;  Laterality: Left;   JOINT REPLACEMENT Left    SPLENECTOMY     TONSILLECTOMY       Social History   reports that she quit smoking about 16 years ago. Her smoking use included cigarettes. She has never used smokeless tobacco. She reports that she does not drink alcohol or use drugs.   Family History   Her family history is negative for Breast cancer.    Allergies Allergies  Allergen Reactions   Aleve [Naproxen] Rash   Mobic [Meloxicam] Rash   Tape Rash     Home Medications  Prior to Admission medications   Medication Sig Start Date End Date Taking? Authorizing Provider  acetaminophen (TYLENOL) 500 MG tablet Take 500 mg by mouth every 6 (six) hours as needed.  05/15/11   [provider]  albuterol (PROAIR HFA) 108 (90 Base) MCG/ACT inhaler Inhale 2 puffs into the lungs every 6 (six) hours as needed for wheezing or shortness of breath.  04/12/17   [provider]  aspirin EC 81 MG tablet Take 81 mg by mouth daily.     [provider]  docusate sodium (COLACE) 100 MG capsule Take 200 mg by mouth daily.     [provider]  DULoxetine (CYMBALTA) 60 MG capsule Take 60 mg by mouth daily.  03/15/17   [provider]  enoxaparin (LOVENOX) 40 MG/0.4ML injection Inject 0.4 mLs (40 mg total) into the skin daily. 03/13/19 04/12/19  Caren Griffins, MD  esomeprazole (NEXIUM) 40 MG capsule Take 40 mg by mouth daily.  01/16/18   [provider]  folic acid (FOLVITE) 1 MG tablet Take 1 mg by mouth 3 (three) times daily.  08/01/17   [provider]  furosemide (LASIX) 40 MG tablet Take 60 mg by mouth daily.  11/28/17   [provider]  loratadine (CLARITIN) 10 MG tablet Take 10 mg by mouth daily.    [provider]  metoprolol tartrate (LOPRESSOR) 25 MG tablet Take 12.5 mg by mouth 2 (two) times daily.  08/01/17   [provider]  midodrine (PROAMATINE) 10 MG tablet Take 1 tablet (10 mg total) by mouth 3 (three) times daily with meals. 03/13/19   Caren Griffins, MD  Multiple Vitamins-Minerals (ICAPS AREDS 2 PO) Take 2 tablets by mouth daily.    [provider]  oxyCODONE (OXY IR/ROXICODONE) 5 MG immediate release tablet Take 1 tablet (5 mg total) by mouth every 6 (six) hours as needed for severe pain. 03/21/19   Medina-Vargas, Monina C, NP  OXYGEN Inhale 2 L into  the lungs.    [provider]  pramipexole (MIRAPEX) 1 MG tablet Take 1 mg by mouth at bedtime.  02/05/18   [provider]  predniSONE (DELTASONE) 5 MG tablet Take 5 mg by mouth daily.  12/25/17   [provider]  sulfamethoxazole-trimethoprim (BACTRIM DS,SEPTRA DS) 800-160 MG  tablet TAKE ONE TABLET BY MOUTH EVERY MONDAY, WEDNESDAY, AND FRIDAY 12/24/17   [provider]  tiotropium (SPIRIVA HANDIHALER) 18 MCG inhalation capsule Place 18 mcg into inhaler and inhale daily.  04/12/17   [provider]     Critical care time: 45 minutes     CRITICAL CARE Performed by: Otilio Carpen Gleason   Total critical care time: 45 minutes  Critical care time was exclusive of separately billable procedures and treating other patients.  Critical care was necessary to treat or prevent imminent or life-threatening deterioration.  Critical care was time spent personally by me on the following activities: development of treatment plan with patient and/or surrogate as well as nursing, discussions with consultants, evaluation of patient's response to treatment, examination of patient, obtaining history from patient or surrogate, ordering and performing treatments and interventions, ordering and review of laboratory studies, ordering and review of radiographic studies, pulse oximetry and re-evaluation of patient's condition.  Otilio Carpen Gleason, PA-C [facility]   [phone]  Personally seen and examined, chart reviewed.  Case was discussed at length with physician assistant Gleason.  Agree with above assessment and plan.  Continues to have marginal blood pressure.  Will watch in the ICU tonight and continue current therapy with cautious volume expansion.  Agree with above assessment and plan.

## 2019-04-28 NOTE — Progress Notes (Signed)
eLink Physician-Brief Progress Note Patient Name: Virginia Thompson DOB: 1928-07-30 MRN: [id number]   Date of Service  04-30-2019  HPI/Events of Note  8F with COPD and CHF who was recently admitted, discharged to Rehab, now readmitted with new Afib with RVR and hypotension (SBP in 60s). She received 250-500cc IVF en route to ICU from rehab floor and spontaneously converted to sinus rhythm. She received another 250cc IVF in the ICU. Nevertheless, on my exam via in-room camera, I note that the patient is still hypotensive despite being in sinus rhythm.  CXR unchanged from study a few days earlier. No UA result yet. Not febrile on arrival. Minimal (none to trace) pitting edema though BNP is very elevated.   eICU Interventions  # Cardiac: Afib w/ RVR + hypotension prompted admission. - Now SR. Consider amiodarone if AF recurs. - Levophed for MAP >\= 82mmHg - Consider additional fluid challenge. - Hold home diuretics for now.  # Resp:  - Supplemental O2 as needed to maintain sats ~94%. - Caution regarding excessive fluids given CHF history. - Nebs Q6H.  # ID: - Obtain UA/UCx and BCx (incl from PICC) to r/o infection. - Empiric antimicrobials (vanc/cefepime) given ongoing hypotension.  # GI: - NPO for now.  # Renal: - Kidney function at baseline. - Replete K with 40 mEq via PICC.  # GOC: - There had been notes mentioning that she may be a good candidate for palliative care while she was at rehab. Consider palliative care involvement in AM.     Intervention Category Evaluation Type: New Patient Evaluation  Marily Lente Netanel Yannuzzi 30-Apr-2019, 2:10 AM

## 2019-04-28 NOTE — Discharge Summary (Signed)
DEATH SUMMARY   Patient Details  Name: Virginia Thompson MRN: [id number] DOB: 1928/09/19  Admission/Discharge Information   Admit Date:  04/10/19  Date of Death: Date of Death: April 11, 2019  Time of Death: Time of Death: 05/26/1757  Length of Stay: 1  Referring Physician: Angeline Slim, MD   Reason(s) for Hospitalization  Atrial Fibrillation with RVR  Diagnoses  Preliminary cause of death:   Atrial Fibrillation Hip Fracture Autoimmune Hemolytic Anemia Aspiration Pneumonia  Secondary Diagnoses (including complications and co-morbidities):  Active Problems:   Atrial fibrillation [facility])   Dyspnea   Brief Hospital Course (including significant findings, care, treatment, and services provided and events leading to death)  Virginia Thompson was a 86 y.o. 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  Patient was intubated and PCCM admitted. Pt was treated for HF and COPD exacerbation and extubated and then discharged back to Rehab.  Overnight on 2/6, pt developed new onset Afib with RVR and hypotension, so was transferred to the ICU.  She briefly converted to NSR with fluids, but shortly returned back to AF with RVR.  She was started on amiodarone gtt without effect and subsequently developed worsening hypotension requiring norepinephrine and phenylephrine for support. She was hypoxemic and diuresed with IV lasix.  She was not a candidate for DCCV without TEE since she hadn't been on [facility] in the past due to bleeding. She became more encephalopathic and unstable. I discussed the Virginia Thompson chronic medical conditions and failure to progress with inpatient rehab.  Ultimate the patient is succumbed to her chronic medical issues and old age in the setting of  recent functional decline. She was on maximum medical therapy with anticoagulation in the setting of VTE and abx in the setting of infection.  Patient's daughter at bedside and felt mother was suffering. She was transitioned to comfort measures.  She passed away peacefully time of death 17:59.    Pertinent Labs and Studies  Significant Diagnostic Studies DG Chest 2 View  Result Date: 04/02/2019 CLINICAL DATA:  Fecal incontinence and shortness of breath. EXAM: CHEST - 2 VIEW COMPARISON:  03/30/2019 FINDINGS: Right-sided PICC line is noted with tip projecting over the cavoatrial junction. Mild cardiac enlargement and aortic atherosclerosis. Bilateral upper and lower lung zone interstitial and airspace opacities are again noted. The aeration to lungs is not significantly changed in the interval. IMPRESSION: Persistent bilateral upper and lower lung zone airspace opacities compatible with multifocal infection. Electronically Signed   By: Kerby Moors M.D.   On: 04/02/2019 10:11   DG Abd 1 View  Result Date: 04/02/2019 CLINICAL DATA:  Fecal incontinence and shortness of breath. EXAM: ABDOMEN - 1 VIEW COMPARISON:  None. FINDINGS: No abnormal bowel distension identified. There is a moderate stool burden identified throughout the colon up to the rectum. No free air noted. Bilateral pleural effusions and lower lobe airspace opacities noted within the visualized portions of the chest. Previous ORIF of the left femur. IMPRESSION: 1. Nonobstructive bowel gas pattern. 2. Moderate stool burden within the colon. Electronically Signed   By: Kerby Moors M.D.   On: 04/02/2019 10:07   CT Head Wo Contrast  Result Date: 03/24/2019 CLINICAL DATA:  Encephalopathy EXAM: CT HEAD WITHOUT CONTRAST TECHNIQUE: Contiguous axial images were obtained from the  base of the skull through the vertex without intravenous contrast. COMPARISON:  04/08/2016 FINDINGS: Brain: No evidence of acute infarction, hemorrhage, hydrocephalus,  extra-axial collection or mass lesion/mass effect. Atrophy and chronic small vessel ischemia that is typical of age Vascular: No hyperdense vessel or unexpected calcification. Skull: Normal. Negative for fracture or focal lesion. Sinuses/Orbits: Chronic sinusitis with endoscopic sinus surgery. Nasopharyngeal fluid in the setting of intubation. Bilateral cataract resection IMPRESSION: Aging brain.  No acute finding. Electronically Signed   By: Monte Fantasia M.D.   On: 03/24/2019 06:09   CT Angio Chest PE W and/or Wo Contrast  Result Date: 03/24/2019 CLINICAL DATA:  Respiratory distress. EXAM: CT ANGIOGRAPHY CHEST WITH CONTRAST TECHNIQUE: Multidetector CT imaging of the chest was performed using the standard protocol during bolus administration of intravenous contrast. Multiplanar CT image reconstructions and MIPs were obtained to evaluate the vascular anatomy. CONTRAST:  39mL OMNIPAQUE IOHEXOL 350 MG/ML SOLN COMPARISON:  August 01, 2006 FINDINGS: Cardiovascular: There is marked severity calcification of the thoracic aorta. Satisfactory opacification of the pulmonary arteries to the segmental level. No evidence of pulmonary embolism. A small area of volume averaging is suspected within a proximal branch of the right pulmonary artery along the right hilum (axial CT image 52, CT series number 5/sagittal CT image 69, CT series number 9). Normal heart size. No pericardial effusion. Marked severity coronary artery calcification is noted. Mediastinum/Nodes: No enlarged mediastinal, hilar, or axillary lymph nodes. Lungs/Pleura: Endotracheal and nasogastric tubes are seen. A 4 mm noncalcified lung nodule is seen within the left apex (axial CT image 11, CT series number 6). This represents a new finding when compared to the prior study. Moderate severity patchy areas of atelectasis and/or infiltrate are seen involving the bilateral upper lobes. Moderate severity areas of consolidation is seen within the bilateral lower  lobes. Moderate size bilateral pleural effusions are seen. There is no evidence of a pneumothorax. Upper Abdomen: No acute abnormality. Musculoskeletal: Multilevel degenerative changes seen throughout the thoracic spine. Review of the MIP images confirms the above findings. IMPRESSION: 1. No evidence of pulmonary embolism. 2. Moderate severity patchy bilateral upper lobe atelectasis and/or infiltrate. 3. Moderate severity bilateral lower lobe consolidation. 4. Moderate-sized bilateral pleural effusions. 5. 4 mm noncalcified left apical lung nodule. This represents a new finding when compared to the prior study dated August 01, 2006. Correlation with 12 month follow-up chest CT is recommended to determine stability. Electronically Signed   By: Virgina Norfolk M.D.   On: 03/24/2019 17:36   DG Pelvis Portable  Result Date: 03/07/2019 CLINICAL DATA:  Status post left femur surgery EXAM: PORTABLE PELVIS 1-2 VIEWS COMPARISON:  None. FINDINGS: Pelvic ring is intact. Postsurgical changes are noted in the left femur. Previously seen femoral shaft fracture is again noted. Vascular calcifications are seen. IMPRESSION: Status post ORIF of left femoral fracture. Electronically Signed   By: Inez Catalina M.D.   On: 03/07/2019 20:03   DG Chest Port 1 View  Result Date: 04/11/19 CLINICAL DATA:  Acute respiratory distress. EXAM: PORTABLE CHEST 1 VIEW COMPARISON:  04/24/2019 FINDINGS: Again noted are diffuse bilateral hazy airspace opacities. These are relatively stable from prior study. The right-sided PICC line is stable in position. The heart size is stable. Aortic calcifications are noted. There is no significant pneumothorax. There are likely small bilateral pleural effusions. There is no acute osseous abnormality. IMPRESSION: No significant interval change. Persistent bilateral diffuse pulmonary opacities are again noted. Electronically Signed   By: Jamie Kato.D.  On: April 16, 2019 00:47   DG Chest Port 1  View  Result Date: 04/02/2019 CLINICAL DATA:  Respiratory distress EXAM: PORTABLE CHEST 1 VIEW COMPARISON:  04/02/2019 FINDINGS: Again noted are diffuse bilateral pulmonary opacities. These have slightly improved since the prior study. There is a right-sided PICC line that appears well position. The heart size remains enlarged. Aortic calcifications are again noted. There are likely small bilateral pleural effusions. IMPRESSION: 1. Persistent but improving bilateral pulmonary opacities. 2. Well-positioned right-sided PICC line. 3. Stable cardiac silhouette. Electronically Signed   By: Constance Holster M.D.   On: 04/05/2019 02:07   DG CHEST PORT 1 VIEW  Result Date: 03/30/2019 CLINICAL DATA:  Acute respiratory failure. EXAM: PORTABLE CHEST 1 VIEW COMPARISON:  03/28/2019 FINDINGS: Right-sided PICC line unchanged with tip just below the cavoatrial junction. Lungs are adequately inflated with interval worsening of bilateral perihilar airspace opacification which may be due to interstitial edema or infection. Stable left base/retrocardiac opacification likely effusion/atelectasis. Mild stable cardiomegaly. Remainder of the exam is unchanged. IMPRESSION: 1. Stable cardiomegaly with worsening bilateral perihilar airspace opacification likely moderate interstitial edema and less likely infection. Stable left base opacification likely effusion with atelectasis. 2.  Right-sided PICC line unchanged. Electronically Signed   By: Marin Olp M.D.   On: 03/30/2019 08:07   DG CHEST PORT 1 VIEW  Result Date: 03/28/2019 CLINICAL DATA:  Increased shortness of breath EXAM: PORTABLE CHEST 1 VIEW COMPARISON:  Chest CT from 4 days ago FINDINGS: Tracheal and esophageal extubation. Right PICC with tip at the lower SVC. There is interstitial coarsening and layering left pleural effusion. Cardiomegaly. Aeration has mildly improved from before. IMPRESSION: Interstitial opacities have mildly improved from comparison. There is a  layering left pleural effusion which may have increased from study 4 days ago. Electronically Signed   By: Monte Fantasia M.D.   On: 03/28/2019 05:32   DG Chest Portable 1 View  Result Date: 03/24/2019 CLINICAL DATA:  ET/OG placement EXAM: PORTABLE CHEST 1 VIEW COMPARISON:  Radiograph 03/24/2019 FINDINGS: Endotracheal tube terminates low in the trachea, 2.4 cm from the carina and could be retracted approximately 2 cm to the mid trachea. A transesophageal tube tip and side port below the GE junction, tip terminating beyond the level of imaging. Telemetry leads overlie the chest. There is stable cardiomegaly with bilateral pleural effusions, interstitial opacity throughout both lungs and some cephalization of the vascularity. The aorta is calcified. No pneumothorax. No acute osseous or soft tissue abnormality. Degenerative changes are present in the imaged spine and shoulders. IMPRESSION: Endotracheal tube positioned in the low trachea, could be retracted 2 cm to the mid trachea. Transesophageal tube tip and side port beyond the GE junction. Features compatible with congestive heart failure though an atypical infection could also present with a similar appearance. Electronically Signed   By: Lovena Le M.D.   On: 03/24/2019 05:03   DG Chest Port 1 View  Result Date: 03/24/2019 CLINICAL DATA:  Hypoxia EXAM: PORTABLE CHEST 1 VIEW COMPARISON:  03/10/2019 FINDINGS: Mild cardiomegaly with mild diffuse interstitial opacity. Small left pleural effusion. IMPRESSION: Cardiomegaly with small left pleural effusion and mild diffuse interstitial opacity, possibly indicating congestive heart failure. Electronically Signed   By: Ulyses Jarred M.D.   On: 03/24/2019 03:18   DG CHEST PORT 1 VIEW  Result Date: 03/10/2019 CLINICAL DATA:  Tachypnea.  Shortness of breath. EXAM: PORTABLE CHEST 1 VIEW COMPARISON:  06/17/2006 FINDINGS: Cardiomegaly. Chronic aortic atherosclerosis. Chronic interstitial lung markings. Possible  venous hypertension with mild  interstitial edema. No consolidation, collapse or visible effusion. IMPRESSION: Cardiomegaly. Aortic atherosclerosis. Chronic lung disease. Possible venous hypertension with mild interstitial edema. Electronically Signed   By: Nelson Chimes M.D.   On: 03/10/2019 10:02   DG Swallowing Func-Speech Pathology  Result Date: 04/03/2019 Objective Swallowing Evaluation: Type of Study: MBS-Modified Barium Swallow Study  Patient Details Name: Virginia Thompson MRN: [id number] Date of Birth: 03-27-1928 Today's Date: 04/03/2019 Time: SLP Start Time (ACUTE ONLY): [id number] -SLP Stop Time (ACUTE ONLY): 1104 SLP Time Calculation (min) (ACUTE ONLY): 32 min Past Medical History: Past Medical History: Diagnosis Date . Blood transfusion without reported diagnosis  . Cancer ([place])  . CHF (congestive heart failure) ([place])  . Hypertension  . Hypertension  . Lymphoma ([place])  . Sleep apnea  Past Surgical History: Past Surgical History: Procedure Laterality Date . ABDOMINAL HYSTERECTOMY   . APPENDECTOMY   . BACK SURGERY   . BREAST CYST ASPIRATION Bilateral  . CHOLECYSTECTOMY   . EYE SURGERY   . INTRAMEDULLARY (IM) NAIL INTERTROCHANTERIC Left 03/07/2019  Procedure: INTRAMEDULLARY (IM) NAIL INTERTROCHANTRIC;  Surgeon: Altamese [place], MD;  Location: [facility];  Service: Orthopedics;  Laterality: Left; . JOINT REPLACEMENT Left  . SPLENECTOMY   . 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  Patient was febrile and had worsening respiratory failure so intubated in the ED. Covid-19 negative  Subjective: Pt resting in bed. No family present. RN present intermittently Assessment / Plan / Recommendation CHL IP CLINICAL IMPRESSIONS 04/03/2019 Clinical Impression Pt presents with overall moderate level of fatigue and was on 3liters O2 via  nasal cannula. Pt presents with mild oral phase dysphagia c/b decreased lingual manipulation and bolus cohesion of thin liquids and dysphagia 3. Pt's extended oral phase when consuming dysphagia 3 is likely combination of the appearance dry oral cavity. This patient is known to Probation officer and at bedside she consumes dyspahgia 3 without any oral phase difficulties.  Pt has developed compensatory strategy of achieving/establishing airway closure prior to swallow initiation across all trials. She maintains airway closure during swallow. With use of this strategy she may experience increased work of breathing during PO consumption. Given pt's ability at bedside and fact that she has been consuming dysphagai 3 diet for ~ 1 week, recommend she continue dysphagia 3, thin liquids and medicine whole in puree. SLP Visit Diagnosis Dysphagia, unspecified (R13.10) Attention and concentration deficit following -- Frontal lobe and executive function deficit following -- Impact on safety and function Mild aspiration risk   CHL IP TREATMENT RECOMMENDATION 04/03/2019 Treatment Recommendations Therapy as outlined in treatment plan below   Prognosis 03/25/2019 Prognosis for Safe Diet Advancement Good Barriers to Reach Goals -- Barriers/Prognosis Comment -- CHL IP DIET RECOMMENDATION 04/03/2019 SLP Diet Recommendations Dysphagia 3 (Mech soft) solids;Thin liquid Liquid Administration via -- Medication Administration -- Compensations (No Data) Postural Changes --   CHL IP OTHER RECOMMENDATIONS 04/03/2019 Recommended Consults -- Oral Care Recommendations Oral care BID Other Recommendations --   CHL IP FOLLOW UP RECOMMENDATIONS 03/28/2019 Follow up Recommendations None   CHL IP FREQUENCY AND DURATION 03/25/2019 Speech Therapy Frequency (ACUTE ONLY) min 2x/week Treatment Duration 2 weeks;1 week      CHL IP ORAL PHASE 04/03/2019 Oral Phase Impaired Oral - Pudding Teaspoon -- Oral - Pudding Cup -- Oral - Honey Teaspoon -- Oral - Honey Cup --  Oral - Nectar  Teaspoon -- Oral - Nectar Cup WFL Oral - Nectar Straw -- Oral - Thin Teaspoon -- Oral - Thin Cup Weak lingual manipulation;Decreased bolus cohesion Oral - Thin Straw Weak lingual manipulation;Decreased bolus cohesion Oral - Puree WFL Oral - Mech Soft Weak lingual manipulation;Decreased bolus cohesion;Reduced posterior propulsion;Impaired mastication Oral - Regular -- Oral - Multi-Consistency -- Oral - Pill Other (Comment);WFL Oral Phase - Comment --  CHL IP PHARYNGEAL PHASE 04/03/2019 Pharyngeal Phase WFL Pharyngeal- Pudding Teaspoon -- Pharyngeal -- Pharyngeal- Pudding Cup -- Pharyngeal -- Pharyngeal- Honey Teaspoon -- Pharyngeal -- Pharyngeal- Honey Cup -- Pharyngeal -- Pharyngeal- Nectar Teaspoon -- Pharyngeal -- Pharyngeal- Nectar Cup -- Pharyngeal -- Pharyngeal- Nectar Straw -- Pharyngeal -- Pharyngeal- Thin Teaspoon -- Pharyngeal -- Pharyngeal- Thin Cup -- Pharyngeal -- Pharyngeal- Thin Straw -- Pharyngeal -- Pharyngeal- Puree -- Pharyngeal -- Pharyngeal- Mechanical Soft -- Pharyngeal -- Pharyngeal- Regular -- Pharyngeal -- Pharyngeal- Multi-consistency -- Pharyngeal -- Pharyngeal- Pill -- Pharyngeal -- Pharyngeal Comment --  CHL IP CERVICAL ESOPHAGEAL PHASE 04/03/2019 Cervical Esophageal Phase WFL Pudding Teaspoon -- Pudding Cup -- Honey Teaspoon -- Honey Cup -- Nectar Teaspoon -- Nectar Cup -- Nectar Straw -- Thin Teaspoon -- Thin Cup -- Thin Straw -- Puree -- Mechanical Soft -- Regular -- Multi-consistency -- Pill -- Cervical Esophageal Comment -- Happi Overton 04/03/2019, 12:57 PM              DG C-Arm 1-60 Min  Result Date: 03/07/2019 CLINICAL DATA:  Femur fracture EXAM: DG C-ARM 1-60 MIN CONTRAST:  None FLUOROSCOPY TIME:  Fluoroscopy Time:  3 minutes 40 seconds Number of Acquired Spot Images: 13 COMPARISON:  03/06/2019 FINDINGS: Thirteen low resolution intraoperative spot views of the left femur. Displaced proximal femoral shaft fracture. Evidence of prior hemiarthroplasty at the knee. Subsequent images  demonstrate multiple plate and screw fixation of the proximal femoral fracture. Insertion of intramedullary rod and distal fixating screws with anatomic alignment. Fixating screw placed across the left femoral neck and head. IMPRESSION: Intraoperative fluoroscopic assistance provided during surgical fixation of proximal left femur fracture Electronically Signed   By: Donavan Foil M.D.   On: 03/07/2019 19:03   ECHOCARDIOGRAM COMPLETE  Result Date: 03/08/2019   ECHOCARDIOGRAM REPORT   Patient Name:   Virginia Thompson Date of Exam: 03/08/2019 Medical Rec #:  [id number]      Height:       62.0 in Accession #:    [id number]     Weight:       217.6 lb Date of Birth:  1928/03/18     BSA:          1.98 m Patient Age:    63 years       BP:           68/57 mmHg Patient Gender: F              HR:           95 bpm. Exam Location:  Inpatient Procedure: 2D Echo Indications:    Aortic Stenosis 424.1/135.0  History:        Patient has prior history of Echocardiogram examinations, most                 recent 10/06/2004. CHF; Risk Factors:Hypertension.  Sonographer:    Clayton Lefort RDCS (AE) Referring Phys: [phone] RALPH A NETTEY  Sonographer Comments: Limited patient mobility. IMPRESSIONS  1. Left ventricular ejection fraction, by visual estimation, is 60 to 65%. The left ventricle has  normal function. There is moderately increased left ventricular hypertrophy.  2. Elevated left atrial pressure.  3. Global right ventricle has normal systolic function.The right ventricular size is normal.  4. Left atrial size was severely dilated.  5. Right atrial size was normal.  6. Moderate mitral annular calcification.  7. The mitral valve is normal in structure. No evidence of mitral valve regurgitation. No evidence of mitral stenosis.  8. The tricuspid valve is normal in structure.  9. The aortic valve has an indeterminant number of cusps. Aortic valve regurgitation is not visualized. Moderate aortic valve stenosis. 10. The pulmonic valve was not  well visualized. Pulmonic valve regurgitation is not visualized. 11. The left ventricle has no regional wall motion abnormalities. FINDINGS  Left Ventricle: Left ventricular ejection fraction, by visual estimation, is 60 to 65%. The left ventricle has normal function. The left ventricle has no regional wall motion abnormalities. There is moderately increased left ventricular hypertrophy. Elevated left atrial pressure. Right Ventricle: The right ventricular size is normal.Global RV systolic function is has normal systolic function. Left Atrium: Left atrial size was severely dilated. Right Atrium: Right atrial size was normal in size Pericardium: There is no evidence of pericardial effusion. Mitral Valve: The mitral valve is normal in structure. Moderate mitral annular calcification. No evidence of mitral valve regurgitation. No evidence of mitral valve stenosis by observation. MV peak gradient, 19.2 mmHg. Tricuspid Valve: The tricuspid valve is normal in structure. Tricuspid valve regurgitation is trivial. Aortic Valve: The aortic valve has an indeterminant number of cusps. Aortic valve regurgitation is not visualized. Moderate aortic stenosis is present. Aortic valve mean gradient measures 20.0 mmHg. Aortic valve peak gradient measures 32.5 mmHg. Aortic valve area, by VTI measures 1.13 cm. Pulmonic Valve: The pulmonic valve was not well visualized. Pulmonic valve regurgitation is not visualized. Pulmonic regurgitation is not visualized. Aorta: The aortic root is normal in size and structure. Venous: The inferior vena cava was not well visualized. IAS/Shunts: No atrial level shunt detected by color flow Doppler. Additional Comments: Normal LV systolic function; moderate LVH; moderate AS (mean gradient 21 mmHg); mean gradient across MV elevated (11 mmHg) but no MS by pressure half time; severe LAE.  LEFT VENTRICLE PLAX 2D LVIDd:         3.40 cm  Diastology LVIDs:         3.28 cm  LV e' lateral:   5.44 cm/s LV PW:          1.77 cm  LV E/e' lateral: 31.6 LV IVS:        1.72 cm  LV e' medial:    7.51 cm/s LVOT diam:     1.80 cm  LV E/e' medial:  22.9 LV SV:         4 ml LV SV Index:   1.85 LVOT Area:     2.54 cm  RIGHT VENTRICLE RV Basal diam:  2.79 cm RV S prime:     13.90 cm/s TAPSE (M-mode): 2.1 cm LEFT ATRIUM              Index       RIGHT ATRIUM           Index LA diam:        4.40 cm  2.22 cm/m  RA Area:     11.50 cm LA Vol (A2C):   109.0 ml 55.01 ml/m RA Volume:   20.80 ml  10.50 ml/m LA Vol (A4C):   81.6 ml  41.18 ml/m LA  Biplane Vol: 94.7 ml  47.79 ml/m  AORTIC VALVE AV Area (Vmax):    1.26 cm AV Area (Vmean):   1.14 cm AV Area (VTI):     1.13 cm AV Vmax:           285.00 cm/s AV Vmean:          214.000 cm/s AV VTI:            0.607 m AV Peak Grad:      32.5 mmHg AV Mean Grad:      20.0 mmHg LVOT Vmax:         141.50 cm/s LVOT Vmean:        95.450 cm/s LVOT VTI:          0.270 m LVOT/AV VTI ratio: 0.45  AORTA Ao Root diam: 2.80 cm MITRAL VALVE                         TRICUSPID VALVE MV Area (PHT): 3.19 cm              TR Peak grad:   15.8 mmHg MV Peak grad:  19.2 mmHg             TR Vmax:        266.00 cm/s MV Mean grad:  11.0 mmHg MV Vmax:       2.19 m/s              SHUNTS MV Vmean:      154.0 cm/s            Systemic VTI:  0.27 m MV VTI:        0.43 m                Systemic Diam: 1.80 cm MV PHT:        69.02 msec MV Decel Time: 238 msec MV E velocity: 172.00 cm/s 103 cm/s MV A velocity: 185.00 cm/s 70.3 cm/s MV E/A ratio:  0.93        1.5  Kirk Ruths MD Electronically signed by Kirk Ruths MD Signature Date/Time: 03/08/2019/2:43:58 PM    Final    DG FEMUR MIN 2 VIEWS LEFT  Result Date: 03/07/2019 CLINICAL DATA:  Known left femoral fracture EXAM: LEFT FEMUR 2 VIEWS COMPARISON:  Films from the previous day FLUOROSCOPY TIME:  Radiation Exposure Index (as provided by the fluoroscopic device): Not available If the device does not provide the exposure index: Fluoroscopy Time:  3 minutes 40 seconds Number of  Acquired Images:  13 FINDINGS: Initial images again demonstrate the proximal left femoral shaft fracture. Fixation sideplate is noted in the proximal femur with multiple fixation screws. Medullary rod is in place as well. Proximal and distal fixation screws are seen. The fracture fragments are in near anatomic alignment. IMPRESSION: Status post ORIF of proximal left femoral fracture Electronically Signed   By: Inez Catalina M.D.   On: 03/07/2019 19:01   DG FEMUR PORT MIN 2 VIEWS LEFT  Result Date: 04/03/2019 CLINICAL DATA:  Left femoral fracture status post ORIF EXAM: LEFT FEMUR PORTABLE 2 VIEWS COMPARISON:  03/07/2019 FINDINGS: Frontal and lateral views of the left femur are obtained. The intramedullary rod and multiple proximal screws traversing the comminuted proximal left femoral fracture again noted, without significant change in positioning. No significant callus formation. No evidence of metallic hardware failure or loosening. Distal interlocking screws are unchanged in position. Lateral compartment left knee arthroplasty again noted and stable. There  are diffuse vascular calcifications. Remaining soft tissues are unremarkable. IMPRESSION: 1. Stable postsurgical changes within the left femur. No evidence of metallic hardware failure or loosening. No significant callus formation. Electronically Signed   By: Randa Ngo M.D.   On: 04/03/2019 10:25   DG FEMUR PORT MIN 2 VIEWS LEFT  Result Date: 03/07/2019 CLINICAL DATA:  Status post ORIF of left femoral fracture EXAM: LEFT FEMUR PORTABLE 2 VIEWS COMPARISON:  03/06/2019 FINDINGS: Interval placement of medullary rod with proximal and distal fixation screws is seen. A lateral fixation sideplate along the proximal femur is seen. Prior partial knee replacement is noted. The fracture fragments are in near anatomic alignment. Patellofemoral degenerative changes are seen. IMPRESSION: Status post ORIF of left femoral fracture. Electronically Signed   By: Inez Catalina M.D.   On: 03/07/2019 20:04   ECHOCARDIOGRAM LIMITED  Result Date: 03/24/2019   ECHOCARDIOGRAM REPORT   Patient Name:   Virginia Thompson Date of Exam: 03/24/2019 Medical Rec #:  [id number]      Height:       64.0 in Accession #:    [id number]     Weight:       212.3 lb Date of Birth:  [birth year]     BSA:          2.01 m Patient Age:    38 years       BP:           136/107 mmHg Patient Gender: F              HR:           84 bpm. Exam Location:  Inpatient Procedure: Limited Echo, Color Doppler and Cardiac Doppler Indications:    dyspnea 786.09  History:        Patient has prior history of Echocardiogram examinations, most                 recent 03/08/2019. CHF; Risk Factors:Sleep Apnea.  Sonographer:    Johny Chess Referring Phys: [id number] [place]  1. Left ventricular ejection fraction, by visual estimation, is 60 to 65%. The left ventricle has normal function. Left ventricular septal wall thickness was mildly increased. Mildly increased left ventricular posterior wall thickness. There is mildly increased left ventricular hypertrophy.  2. Left ventricular diastolic parameters are consistent with Grade I diastolic dysfunction (impaired relaxation).  3. The left ventricle has no regional wall motion abnormalities.  4. Global right ventricle has normal systolic function.The right ventricular size is normal. No increase in right ventricular wall thickness.  5. Left atrial size was normal.  6. Right atrial size was normal.  7. Moderate mitral annular calcification.  8. The mitral valve is normal in structure. No evidence of mitral valve regurgitation. No evidence of mitral stenosis.  9. The tricuspid valve is normal in structure. 10. The tricuspid valve is normal in structure. Tricuspid valve regurgitation is mild. 11. Aortic valve area, by VTI measures 1.09 cm. 12. Aortic valve mean gradient measures 19.0 mmHg. 13. Aortic valve peak gradient measures 34.9 mmHg. 14. The aortic valve is  tricuspid. Aortic valve regurgitation is not visualized. Mild to moderate aortic valve stenosis. 15. The pulmonic valve was normal in structure. Pulmonic valve regurgitation is not visualized. 16. Normal pulmonary artery systolic pressure. 17. The inferior vena cava is dilated in size with >50% respiratory variability, suggesting right atrial pressure of 8 mmHg. FINDINGS  Left Ventricle: Left ventricular ejection fraction, by visual estimation, is 60 to  65%. The left ventricle has normal function. The left ventricle has no regional wall motion abnormalities. Mildly increased left ventricular posterior wall thickness. There is mildly increased left ventricular hypertrophy. Left ventricular diastolic parameters are consistent with Grade I diastolic dysfunction (impaired relaxation). Normal left atrial pressure. Right Ventricle: The right ventricular size is normal. No increase in right ventricular wall thickness. Global RV systolic function is has normal systolic function. The tricuspid regurgitant velocity is 2.19 m/s, and with an assumed right atrial pressure  of 8 mmHg, the estimated right ventricular systolic pressure is normal at 27.2 mmHg. Left Atrium: Left atrial size was normal in size. Right Atrium: Right atrial size was normal in size Pericardium: There is no evidence of pericardial effusion. Mitral Valve: The mitral valve is normal in structure. Moderate mitral annular calcification. No evidence of mitral valve regurgitation. No evidence of mitral valve stenosis by observation. Tricuspid Valve: The tricuspid valve is normal in structure. Tricuspid valve regurgitation is mild. Aortic Valve: The aortic valve is tricuspid. . There is moderate thickening and moderate calcification of the aortic valve. Aortic valve regurgitation is not visualized. Mild to moderate aortic stenosis is present. There is moderate thickening of the aortic valve. There is moderate calcification of the aortic valve. Aortic valve mean  gradient measures 19.0 mmHg. Aortic valve peak gradient measures 34.9 mmHg. Aortic valve area, by VTI measures 1.09 cm. Pulmonic Valve: The pulmonic valve was normal in structure. Pulmonic valve regurgitation is not visualized. Pulmonic regurgitation is not visualized. Aorta: The aortic root, ascending aorta and aortic arch are all structurally normal, with no evidence of dilitation or obstruction. Venous: The inferior vena cava is dilated in size with greater than 50% respiratory variability, suggesting right atrial pressure of 8 mmHg. IAS/Shunts: No atrial level shunt detected by color flow Doppler. There is no evidence of a patent foramen ovale. No ventricular septal defect is seen or detected. There is no evidence of an atrial septal defect.  LEFT VENTRICLE PLAX 2D LVIDd:         3.40 cm LVIDs:         2.30 cm LV PW:         1.30 cm LV IVS:        1.20 cm LVOT diam:     1.70 cm LV SV:         29 ml LV SV Index:   13.77 LVOT Area:     2.27 cm  RIGHT VENTRICLE TAPSE (M-mode): 1.4 cm LEFT ATRIUM         Index LA diam:    4.40 cm 2.19 cm/m  AORTIC VALVE AV Area (Vmax):    1.10 cm AV Area (Vmean):   1.05 cm AV Area (VTI):     1.09 cm AV Vmax:           295.50 cm/s AV Vmean:          203.500 cm/s AV VTI:            0.549 m AV Peak Grad:      34.9 mmHg AV Mean Grad:      19.0 mmHg LVOT Vmax:         143.50 cm/s LVOT Vmean:        94.100 cm/s LVOT VTI:          0.263 m LVOT/AV VTI ratio: 0.48  AORTA Ao Root diam: 3.00 cm MITRAL VALVE  TRICUSPID VALVE MV Area (PHT): 2.42 cm              TR Peak grad:   19.2 mmHg MV PHT:        91.06 msec            TR Vmax:        219.00 cm/s MV Decel Time: 314 msec MV E velocity: 136.00 cm/s 103 cm/s  SHUNTS MV A velocity: 144.00 cm/s 70.3 cm/s Systemic VTI:  0.26 m MV E/A ratio:  0.94        1.5       Systemic Diam: 1.70 cm  Skeet Latch MD Electronically signed by Skeet Latch MD Signature Date/Time: 03/24/2019/4:28:47 PM    Final    [place] EKG SITE  RITE  Result Date: 03/24/2019 If Site Rite image not attached, placement could not be confirmed due to current cardiac rhythm.   Microbiology Recent Results (from the past 240 hour(s))  Culture, blood (routine x 2)     Status: None (Preliminary result)   Collection Time: Apr 09, 2019  3:05 AM   Specimen: BLOOD  Result Value Ref Range Status   Specimen Description BLOOD LEFT ANTECUBITAL  Final   Special Requests   Final    BOTTLES DRAWN AEROBIC ONLY Blood Culture results may not be optimal due to an inadequate volume of blood received in culture bottles   Culture NO GROWTH < 12 HOURS  Final   Report Status PENDING  Incomplete  Culture, blood (routine x 2)     Status: None (Preliminary result)   Collection Time: 04-09-19  3:08 AM   Specimen: BLOOD LEFT HAND  Result Value Ref Range Status   Specimen Description BLOOD LEFT HAND  Final   Special Requests   Final    BOTTLES DRAWN AEROBIC AND ANAEROBIC Blood Culture results may not be optimal due to an inadequate volume of blood received in culture bottles   Culture NO GROWTH < 12 HOURS  Final   Report Status PENDING  Incomplete    Lab Basic Metabolic Panel: Recent Labs  Lab 03/31/19 0500 03/31/19 1832 04/01/19 0500 April 09, 2019 0050 2019-04-09 1650  NA 139  --  140 140 145  K 3.9  --  3.6 3.2* 4.2  CL 92*  --  89* 87* 96*  CO2 40*  --  42* 44* 40*  GLUCOSE 120*  --  90 119* 233*  BUN 13  --  11 24* 27*  CREATININE 0.70  --  0.62 0.71 1.01*  CALCIUM 8.5*  --  8.4* 8.0* 7.7*  MG  --  2.2  --  1.9  --    Liver Function Tests: Recent Labs  Lab 04/01/19 0500 09-Apr-2019 0050 04/09/19 1650  AST 28 27 33  ALT 35 27 23  ALKPHOS 92 80 70  BILITOT 0.4 0.4 0.7  PROT 5.7* 5.3* 4.9*  ALBUMIN 2.7* 2.5* 2.1*   No results for input(s): LIPASE, AMYLASE in the last 168 hours. No results for input(s): AMMONIA in the last 168 hours. CBC: Recent Labs  Lab 03/31/19 0500 03/31/19 0500 04/01/19 0500 04/04/19 0350 April 09, 2019 0050 09-Apr-2019 1650   WBC 6.3  --  7.0 6.5 6.6 10.4  NEUTROABS  --   --  4.5 4.2  --   --   HGB 8.5*  --  8.9* 9.4* 8.7* 7.1*  HCT 29.8*   < > 31.3* 33.4*  31.8* 32.1* 26.4*  MCV 107.2*  --  107.2* 107.7* 109.2* 112.8*  PLT 313  --  330 356 346 270   < > = values in this interval not displayed.   Cardiac Enzymes: No results for input(s): CKTOTAL, CKMB, CKMBINDEX, TROPONINI in the last 168 hours. Sepsis Labs: Recent Labs  Lab 04/01/19 0500 04/02/19 1722 04/03/19 0511 04/04/19 0350 Apr 15, 2019 0050 04-15-19 0355 April 15, 2019 1650  PROCALCITON  --  <0.10 <0.10 <0.10  --   --   --   WBC 7.0  --   --  6.5 6.6  --  10.4  LATICACIDVEN  --   --   --   --   --  0.9 4.1*   Lenice Llamas, MD Pulmonary and [place] Pager: [phone] [phone]

## 2019-04-28 NOTE — Progress Notes (Signed)
Pt transitioned to comfort care. Pt asystole on heart monitor.  Pt's lung and heart sounds were auscultated and pt was pronounced at 1759, by primary nurse Deboraha Sprang RN and Harrel Lemon, RN. Pt's daughter Jacqlyn Larsen at bedside. MD Lenice Llamas notified.

## 2019-04-28 NOTE — Progress Notes (Addendum)
Brief Progress Note  Patient admitted earlier this morning for New onset AF with RVR from nursing home after recent admission for the same. History of COPD, OSA, AIHA and Hip fracture in jan 2021 after fall.   Briefly converted to NSR with fluids. Now back in AF RVR. Will start amiodarone gtt. Suspect this is unstable SVT. Cannot do DCCV without TEE since she hasn't been on AC in the past. Will start amiodarone gtt with bolus to attempt rate control. May need cardiology consultation in the future if not working for TEE with DCCV. She also has acute metabolic encephalopathy (at baseline AOx2,) which is likely secondary to her acute illness with underlying age related dementia. HASBLED score is 1 so will proceed with heparin gtt. Optimize electrolytes goal K>4, Mg>2. Will diurese with 80 mg IV lasix x1 given acute pulmonary edema.    ADDENDUM: I updated the daughter at he bedside today.  Discussed that mom has multiple medical issues including recurrent anemia, recurrent atrial fibrillation, history of cancer, and now the hip fracture.  Apparently prior to the hip fracture she was living at home with daughter and was more mobile and alert. Has had a functional decline and refractory atrial fibrillation.  Discussed making mom a DNR and that with the hip fracture and other medical issues she is likely looking at the last weeks to months of her life. Will consult palliative care. Will switch her from norepi to neosynephrine to help with rate control.   The patient is critically ill with multiple organ systems failure and requires high complexity decision making for assessment and support, frequent evaluation and titration of therapies, application of advanced monitoring technologies and extensive interpretation of multiple databases.   Critical Care Time devoted to patient care services described in this note is 53 minutes. This time reflects time of care of this [place] . This critical care  time does not reflect separately billable procedures or procedure time, teaching time or supervisory time of PA/NP/Med student/Med Resident etc but could involve care discussion time.  Leone Haven Pulmonary and Critical Care Medicine 04/22/19 9:36 AM  Pager: [phone] After hours pager: [phone]

## 2019-04-28 NOTE — Progress Notes (Signed)
Pt. Did not tolerate the bipap. Pt. sats would not maintain. RN had to place pt. Back on high flow cannula.

## 2019-04-28 NOTE — Progress Notes (Signed)
Progress Update  Called to assess patient for worsening hypoxemic and encephalopathy.  Also hypotensive despite maximum norepinephrine and phenylephrine.  Evaluated patient at bedside.  Sent labs, chest xray and lower extremity dopplers. Unfortunately I feel patient is succumbing to her chronic medical issues and old age in the setting of recent functional decline. On maximum medical therapy with anticoagulation in the setting of VTE and abx in the setting of infection.  Patient's daughter at bedside and feels mother is suffering. Plan to transition to comfort measures.   25 minutes additional critical care time administered.   Lenice Llamas, MD Pulmonary and [place] Pager: [phone]

## 2019-04-28 NOTE — Progress Notes (Signed)
ANTICOAGULATION CONSULT NOTE - Initial Consult  Pharmacy Consult for Heparin Indication: atrial fibrillation with RVR  Allergies  Allergen Reactions  . Aleve [Naproxen] Rash  . Mobic [Meloxicam] Rash  . Tape Rash    Patient Measurements: Height: 5\' 4"  (162.6 cm) Weight: 188 lb 15 oz (85.7 kg) IBW/kg (Calculated) : 54.7 Heparin Dosing Weight: 73.5  Vital Signs: Temp: 99 F (37.2 C) (02/07 0700) Temp Source: Oral (02/07 0700) BP: 81/70 (02/07 0915) Pulse Rate: 120 (02/07 0915)  Labs: Recent Labs    04/04/19 0350 04/25/2019 0050  HGB 9.4* 8.7*  HCT 33.4*  31.8* 32.1*  PLT 356 346  CREATININE  --  0.71    Estimated Creatinine Clearance: 51.5 mL/min (by C-G formula based on SCr of 0.71 mg/dL).   Medical History: Past Medical History:  Diagnosis Date  . Blood transfusion without reported diagnosis   . Cancer ([place])   . CHF (congestive heart failure) ([place])   . Hypertension   . Hypertension   . Lymphoma ([place])   . Sleep apnea     Assessment: 86 year old female in Afib with RVR to start IV Heparin drip per pharmacy dosing.   Hgb 8.7 - stable based on prior labs in setting of known autoimmune hemolytic anemia.  Platelets are within normal limits.  No bleeding reported or history of bleeding.  HASBLED score 1.  She was not on anticoagulation prior to admission.   Goal of Therapy:  Heparin level 0.3-0.7 units/ml Monitor platelets by anticoagulation protocol: Yes   Plan:  Give 4000 units bolus x 1 Start heparin infusion at 1150 units/hr Check anti-Xa level in 8 hours and daily while on heparin Continue to monitor H&H and platelets  Sloan Leiter, PharmD, BCPS, BCCCP Clinical Pharmacist Please refer to [facility] for [place] numbers 2019-04-25,9:49 AM

## 2019-04-28 DEATH — deceased

## 2020-04-02 IMAGING — DX DG CHEST 1V PORT
1 series · 1 of 1 positions shown · non-contrast
Comparison: 04/05/2019

CLINICAL DATA: Acute respiratory distress.

EXAM:
PORTABLE CHEST 1 VIEW

[chest]
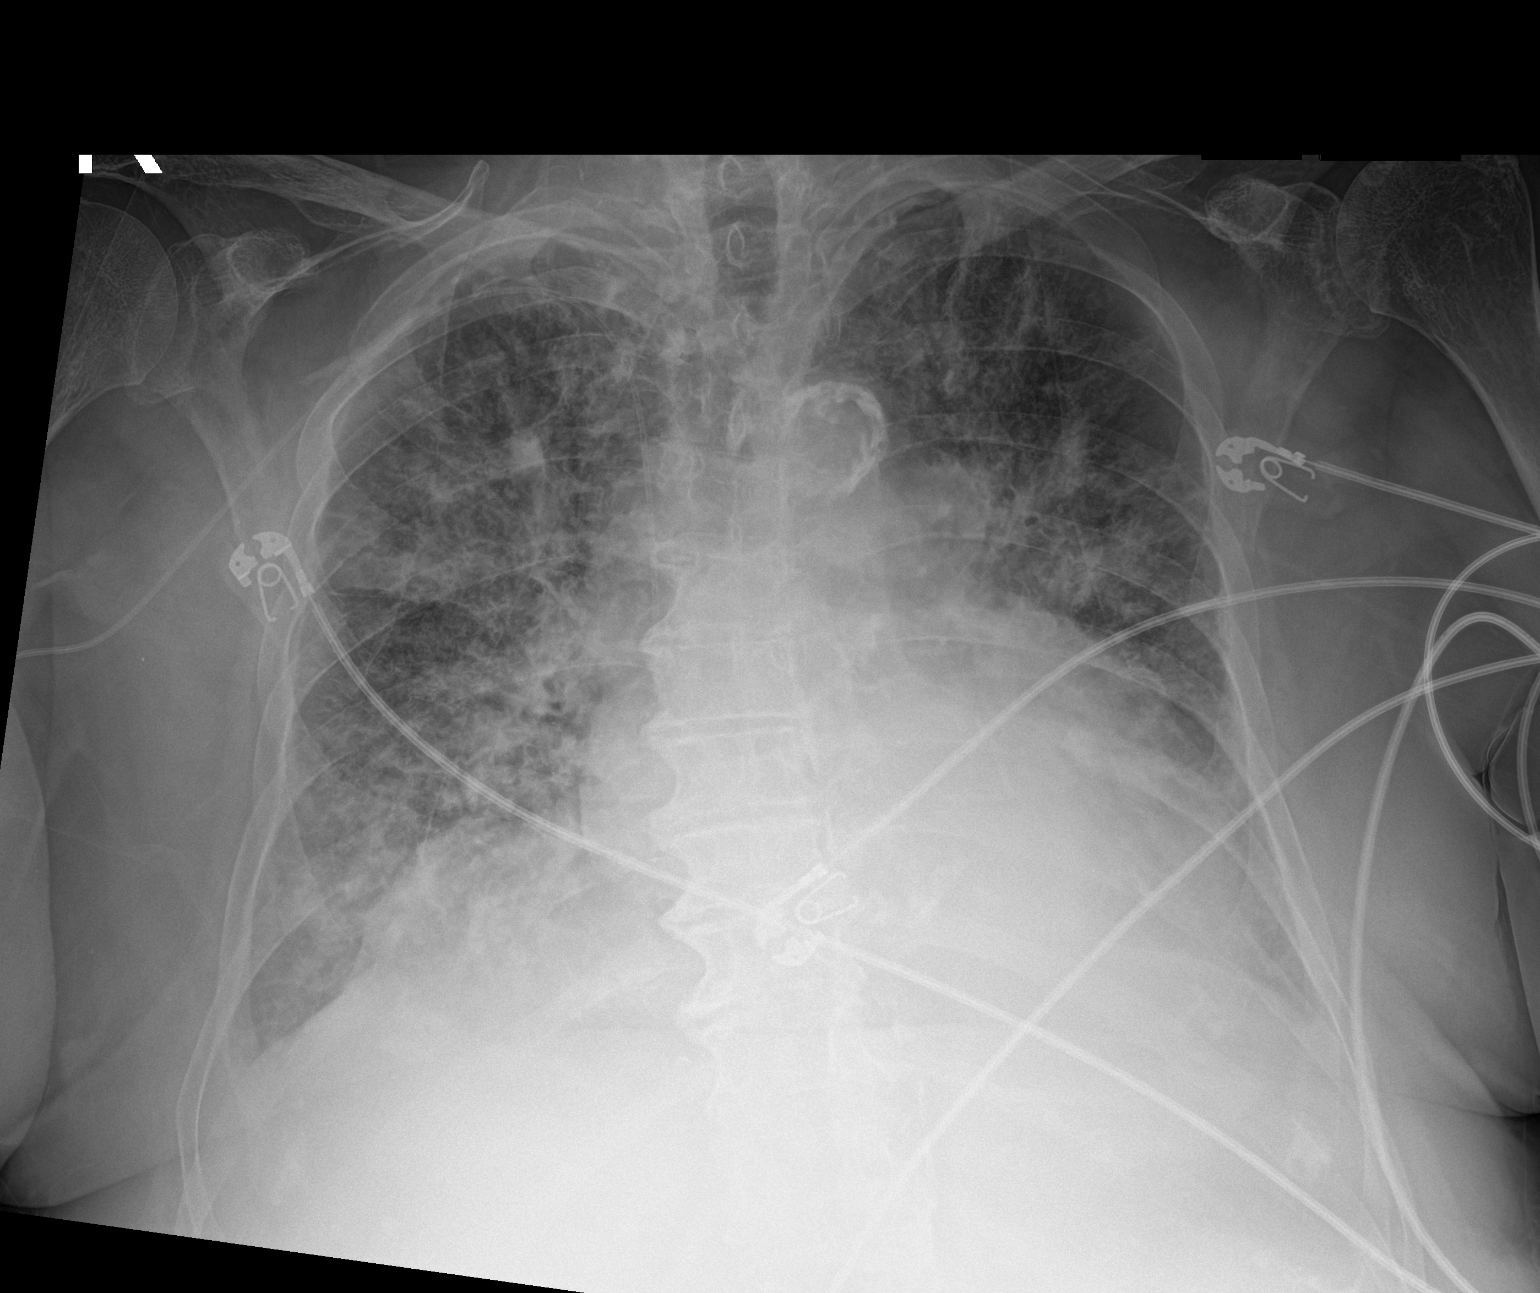

[1 of 1 positions shown; findings below may reference images not displayed]

FINDINGS: Again noted are diffuse bilateral hazy airspace opacities. These are
relatively stable from prior study. The right-sided PICC line is
stable in position. The heart size is stable. Aortic calcifications
are noted. There is no significant pneumothorax. There are likely
small bilateral pleural effusions. There is no acute osseous
abnormality.
IMPRESSION: No significant interval change. Persistent bilateral diffuse
pulmonary opacities are again noted.
